# Patient Record
Sex: Female | Born: 1935 | Race: White | Hispanic: No | Smoking: Former smoker
Health system: Southern US, Community
[De-identification: ages and names within clinical notes are randomized; demographics above are authoritative.]

## PROBLEM LIST (undated history)

## (undated) DIAGNOSIS — I10 Essential (primary) hypertension: Secondary | ICD-10-CM

## (undated) DIAGNOSIS — E079 Disorder of thyroid, unspecified: Secondary | ICD-10-CM

## (undated) DIAGNOSIS — C50919 Malignant neoplasm of unspecified site of unspecified female breast: Secondary | ICD-10-CM

## (undated) DIAGNOSIS — Z923 Personal history of irradiation: Secondary | ICD-10-CM

## (undated) HISTORY — PX: CHOLECYSTECTOMY: SHX55

---

## 1999-01-07 HISTORY — PX: BREAST LUMPECTOMY: SHX2

## 1999-06-05 DIAGNOSIS — C50919 Malignant neoplasm of unspecified site of unspecified female breast: Secondary | ICD-10-CM

## 1999-06-05 HISTORY — DX: Malignant neoplasm of unspecified site of unspecified female breast: C50.919

## 1999-06-05 HISTORY — PX: BREAST BIOPSY: SHX20

## 2003-11-21 ENCOUNTER — Ambulatory Visit: Payer: Self-pay | Admitting: Oncology

## 2003-12-07 ENCOUNTER — Ambulatory Visit: Payer: Self-pay | Admitting: Oncology

## 2004-05-13 ENCOUNTER — Ambulatory Visit: Payer: Self-pay | Admitting: Oncology

## 2004-05-17 ENCOUNTER — Ambulatory Visit: Payer: Self-pay | Admitting: Oncology

## 2004-11-15 ENCOUNTER — Ambulatory Visit: Payer: Self-pay | Admitting: Oncology

## 2005-05-12 ENCOUNTER — Ambulatory Visit: Payer: Self-pay | Admitting: Oncology

## 2005-05-14 ENCOUNTER — Ambulatory Visit: Payer: Self-pay | Admitting: Oncology

## 2005-06-06 ENCOUNTER — Ambulatory Visit: Payer: Self-pay | Admitting: Oncology

## 2005-11-12 ENCOUNTER — Ambulatory Visit: Payer: Self-pay | Admitting: Oncology

## 2005-12-06 ENCOUNTER — Ambulatory Visit: Payer: Self-pay | Admitting: Oncology

## 2006-05-07 ENCOUNTER — Ambulatory Visit: Payer: Self-pay | Admitting: Oncology

## 2006-05-11 ENCOUNTER — Ambulatory Visit: Payer: Self-pay | Admitting: Oncology

## 2006-05-18 ENCOUNTER — Ambulatory Visit: Payer: Self-pay | Admitting: Oncology

## 2006-06-07 ENCOUNTER — Ambulatory Visit: Payer: Self-pay | Admitting: Oncology

## 2006-12-07 ENCOUNTER — Ambulatory Visit: Payer: Self-pay | Admitting: Oncology

## 2006-12-09 ENCOUNTER — Ambulatory Visit: Payer: Self-pay | Admitting: Oncology

## 2007-01-07 ENCOUNTER — Ambulatory Visit: Payer: Self-pay | Admitting: Oncology

## 2007-05-19 ENCOUNTER — Ambulatory Visit: Payer: Self-pay | Admitting: Oncology

## 2007-06-07 ENCOUNTER — Ambulatory Visit: Payer: Self-pay | Admitting: Oncology

## 2007-06-09 ENCOUNTER — Ambulatory Visit: Payer: Self-pay | Admitting: Oncology

## 2007-07-02 ENCOUNTER — Ambulatory Visit: Payer: Self-pay | Admitting: Family Medicine

## 2007-07-07 ENCOUNTER — Ambulatory Visit: Payer: Self-pay | Admitting: Oncology

## 2007-12-07 ENCOUNTER — Ambulatory Visit: Payer: Self-pay | Admitting: Oncology

## 2007-12-16 ENCOUNTER — Ambulatory Visit: Payer: Self-pay | Admitting: Oncology

## 2008-01-07 ENCOUNTER — Ambulatory Visit: Payer: Self-pay | Admitting: Oncology

## 2008-05-19 ENCOUNTER — Ambulatory Visit: Payer: Self-pay | Admitting: Oncology

## 2008-06-06 ENCOUNTER — Ambulatory Visit: Payer: Self-pay | Admitting: Oncology

## 2008-06-13 ENCOUNTER — Ambulatory Visit: Payer: Self-pay | Admitting: Oncology

## 2008-07-06 ENCOUNTER — Ambulatory Visit: Payer: Self-pay | Admitting: Oncology

## 2008-11-06 ENCOUNTER — Ambulatory Visit: Payer: Self-pay | Admitting: Oncology

## 2008-11-28 ENCOUNTER — Ambulatory Visit: Payer: Self-pay | Admitting: Oncology

## 2008-12-06 ENCOUNTER — Ambulatory Visit: Payer: Self-pay | Admitting: Oncology

## 2009-05-22 ENCOUNTER — Ambulatory Visit: Payer: Self-pay | Admitting: Oncology

## 2009-05-29 ENCOUNTER — Ambulatory Visit: Payer: Self-pay | Admitting: Oncology

## 2009-06-06 ENCOUNTER — Ambulatory Visit: Payer: Self-pay | Admitting: Oncology

## 2010-05-28 ENCOUNTER — Ambulatory Visit: Payer: Self-pay | Admitting: Oncology

## 2010-06-05 ENCOUNTER — Ambulatory Visit: Payer: Self-pay | Admitting: Oncology

## 2010-06-07 ENCOUNTER — Ambulatory Visit: Payer: Self-pay | Admitting: Oncology

## 2011-05-30 ENCOUNTER — Ambulatory Visit: Payer: Self-pay | Admitting: Oncology

## 2011-06-06 ENCOUNTER — Ambulatory Visit: Payer: Self-pay | Admitting: Oncology

## 2011-06-06 LAB — COMPREHENSIVE METABOLIC PANEL
Albumin: 3.6 g/dL (ref 3.4–5.0)
Alkaline Phosphatase: 82 U/L (ref 50–136)
Anion Gap: 8 (ref 7–16)
Bilirubin,Total: 0.4 mg/dL (ref 0.2–1.0)
Calcium, Total: 9.3 mg/dL (ref 8.5–10.1)
Chloride: 103 mmol/L (ref 98–107)
Co2: 27 mmol/L (ref 21–32)
Glucose: 106 mg/dL — ABNORMAL HIGH (ref 65–99)
Potassium: 4 mmol/L (ref 3.5–5.1)
SGOT(AST): 27 U/L (ref 15–37)
SGPT (ALT): 29 U/L
Sodium: 138 mmol/L (ref 136–145)
Total Protein: 7.5 g/dL (ref 6.4–8.2)

## 2011-06-06 LAB — CBC CANCER CENTER
Basophil #: 0.1 x10 3/mm (ref 0.0–0.1)
Basophil %: 0.7 %
Eosinophil #: 0.5 x10 3/mm (ref 0.0–0.7)
Eosinophil %: 6.2 %
HCT: 41.7 % (ref 35.0–47.0)
HGB: 13.9 g/dL (ref 12.0–16.0)
Lymphocyte #: 2 x10 3/mm (ref 1.0–3.6)
Lymphocyte %: 26.5 %
MCH: 31.5 pg (ref 26.0–34.0)
MCHC: 33.3 g/dL (ref 32.0–36.0)
MCV: 95 fL (ref 80–100)
Neutrophil #: 4.4 x10 3/mm (ref 1.4–6.5)
Platelet: 262 x10 3/mm (ref 150–440)
RBC: 4.41 10*6/uL (ref 3.80–5.20)
RDW: 14.5 % (ref 11.5–14.5)
WBC: 7.7 x10 3/mm (ref 3.6–11.0)

## 2011-06-07 ENCOUNTER — Ambulatory Visit: Payer: Self-pay | Admitting: Oncology

## 2011-06-07 LAB — CANCER ANTIGEN 27.29: CA 27.29: 19.3 U/mL (ref 0.0–38.6)

## 2012-05-15 ENCOUNTER — Ambulatory Visit: Payer: Self-pay | Admitting: Emergency Medicine

## 2012-05-15 LAB — URINALYSIS, COMPLETE
Glucose,UR: NEGATIVE mg/dL (ref 0–75)
Ketone: NEGATIVE
Nitrite: NEGATIVE

## 2012-06-02 ENCOUNTER — Ambulatory Visit: Payer: Self-pay | Admitting: Family Medicine

## 2013-03-01 ENCOUNTER — Ambulatory Visit: Payer: Self-pay | Admitting: Emergency Medicine

## 2013-06-07 ENCOUNTER — Ambulatory Visit: Payer: Self-pay | Admitting: Family Medicine

## 2013-06-30 DIAGNOSIS — E039 Hypothyroidism, unspecified: Secondary | ICD-10-CM | POA: Diagnosis present

## 2013-06-30 DIAGNOSIS — I10 Essential (primary) hypertension: Secondary | ICD-10-CM | POA: Insufficient documentation

## 2014-04-26 ENCOUNTER — Ambulatory Visit
Admit: 2014-04-26 | Disposition: A | Discharge status: Home / Self Care | Payer: Self-pay | Attending: Ophthalmology | Admitting: Ophthalmology

## 2014-05-11 ENCOUNTER — Other Ambulatory Visit: Payer: Self-pay

## 2014-05-11 DIAGNOSIS — Z1231 Encounter for screening mammogram for malignant neoplasm of breast: Secondary | ICD-10-CM

## 2014-06-13 ENCOUNTER — Ambulatory Visit
Admission: RE | Admit: 2014-06-13 | Discharge: 2014-06-13 | Disposition: A | Discharge status: Home / Self Care | Payer: Medicare Other | Source: Ambulatory Visit | Attending: Diagnostic Radiology | Admitting: Diagnostic Radiology

## 2014-06-13 DIAGNOSIS — Z1231 Encounter for screening mammogram for malignant neoplasm of breast: Secondary | ICD-10-CM | POA: Diagnosis not present

## 2014-06-13 HISTORY — DX: Malignant neoplasm of unspecified site of unspecified female breast: C50.919

## 2015-07-04 ENCOUNTER — Other Ambulatory Visit: Payer: Self-pay | Admitting: Family Medicine

## 2015-07-04 DIAGNOSIS — Z1231 Encounter for screening mammogram for malignant neoplasm of breast: Secondary | ICD-10-CM

## 2015-07-19 ENCOUNTER — Ambulatory Visit
Admission: RE | Admit: 2015-07-19 | Discharge: 2015-07-19 | Disposition: A | Discharge status: Home / Self Care | Payer: Medicare Other | Source: Ambulatory Visit | Attending: Family Medicine | Admitting: Family Medicine

## 2015-07-19 DIAGNOSIS — Z1231 Encounter for screening mammogram for malignant neoplasm of breast: Secondary | ICD-10-CM | POA: Insufficient documentation

## 2016-08-19 ENCOUNTER — Other Ambulatory Visit: Payer: Self-pay | Admitting: Family Medicine

## 2016-08-19 DIAGNOSIS — Z1231 Encounter for screening mammogram for malignant neoplasm of breast: Secondary | ICD-10-CM

## 2016-08-27 ENCOUNTER — Ambulatory Visit
Admission: RE | Admit: 2016-08-27 | Discharge: 2016-08-27 | Disposition: A | Discharge status: Home / Self Care | Payer: Medicare Other | Source: Ambulatory Visit | Attending: Family Medicine | Admitting: Family Medicine

## 2016-08-27 DIAGNOSIS — Z1231 Encounter for screening mammogram for malignant neoplasm of breast: Secondary | ICD-10-CM | POA: Diagnosis present

## 2016-08-27 HISTORY — DX: Personal history of irradiation: Z92.3

## 2016-11-12 ENCOUNTER — Encounter: Payer: Self-pay | Admitting: *Deleted

## 2016-11-12 ENCOUNTER — Ambulatory Visit
Admission: EM | Admit: 2016-11-12 | Discharge: 2016-11-12 | Disposition: A | Discharge status: Home / Self Care | Payer: Medicare Other | Attending: Family Medicine | Admitting: Family Medicine

## 2016-11-12 DIAGNOSIS — M5432 Sciatica, left side: Secondary | ICD-10-CM

## 2016-11-12 HISTORY — DX: Disorder of thyroid, unspecified: E07.9

## 2016-11-12 HISTORY — DX: Essential (primary) hypertension: I10

## 2016-11-12 MED ORDER — GABAPENTIN 300 MG PO CAPS: 300.0000 mg | ORAL_CAPSULE | Freq: Three times a day (TID) | ORAL | 0 refills | Status: DC

## 2016-11-12 MED ORDER — PREDNISONE 20 MG PO TABS: 20.0000 mg | ORAL_TABLET | Freq: Every day | ORAL | 0 refills | Status: DC

## 2016-11-12 NOTE — ED Provider Notes (Signed)
[facility]    CSN: [id number] Arrival date & time: 11/12/16  1538     History   Chief Complaint Chief Complaint  Patient presents with  . Leg Pain    HPI Nichole Hess is a 83 y.o. female.   83 yo female with a c/o left leg pain for 4 days. States pain actually feels like it starts at her lower back area and then radiates down the back and side of her leg down to the top of the foot. Denies any numbness/tingling, saddle anesthesia, or bowel/bladder problems. States she took some gabapentin that she had left over for trigeminal neuralgia and the gabapentin helped.    The history is provided by the patient.  Leg Pain    Past Medical History:  Diagnosis Date  . Breast cancer ([place]) 06/05/99   lumpectomy-positive  . Hypertension   . Personal history of radiation therapy   . Thyroid disease     There are no active problems to display for this patient.   Past Surgical History:  Procedure Laterality Date  . BREAST BIOPSY Left 06/05/99   lumpectomy/rad  . BREAST LUMPECTOMY Left 2001    OB History    No data available       Home Medications    Prior to Admission medications   Medication Sig Start Date End Date Taking? Authorizing Provider  hydrochlorothiazide (MICROZIDE) 12.5 MG capsule Take 12.5 mg daily by mouth.   Yes [provider]  levothyroxine (SYNTHROID, LEVOTHROID) 25 MCG tablet Take 25 mcg daily before breakfast by mouth.   Yes [provider]  lisinopril (PRINIVIL,ZESTRIL) 10 MG tablet Take 10 mg daily by mouth.   Yes [provider]  gabapentin (NEURONTIN) 300 MG capsule Take 1 capsule (300 mg total) 3 (three) times daily by mouth. 11/12/16   Norval Gable, MD  predniSONE (DELTASONE) 20 MG tablet Take 1 tablet (20 mg total) daily by mouth. 11/12/16   Norval Gable, MD    Family History Family History  Problem Relation Age of Onset  . Breast cancer Neg Hx     Social History Social History   Tobacco Use  .  Smoking status: Never Smoker  . Smokeless tobacco: Never Used  Substance Use Topics  . Alcohol use: No    Frequency: Never  . Drug use: No     Allergies   Patient has no known allergies.   Review of Systems Review of Systems   Physical Exam Triage Vital Signs ED Triage Vitals  Enc Vitals Group     BP 11/12/16 1548 (!) 162/61     Pulse Rate 11/12/16 1548 62     Resp 11/12/16 1548 16     Temp 11/12/16 1548 97.9 F (36.6 C)     Temp Source 11/12/16 1548 Oral     SpO2 11/12/16 1548 98 %     Weight --      Height --      Head Circumference --      Peak Flow --      Pain Score 11/12/16 1550 9     Pain Loc --      Pain Edu? --      Excl. in [place]? --    No data found.  Updated Vital Signs BP (!) 158/73 (BP Location: Left Arm)   Pulse 62   Temp 97.9 F (36.6 C) (Oral)   Resp 16   SpO2 98%   Visual Acuity Right Eye Distance:  Left Eye Distance:   Bilateral Distance:    Right Eye Near:   Left Eye Near:    Bilateral Near:     Physical Exam  Constitutional: She appears well-developed and well-nourished. No distress.  Musculoskeletal:       Lumbar back: She exhibits tenderness (over the left buttock over the the sciatic). She exhibits normal range of motion, no bony tenderness, no swelling, no edema, no deformity, no laceration, no pain, no spasm and normal pulse.  Skin: She is not diaphoretic.  Nursing note and vitals reviewed.    UC Treatments / Results  Labs (all labs ordered are listed, but only abnormal results are displayed) Labs Reviewed - No data to display  EKG  EKG Interpretation None       Radiology No results found.  Procedures Procedures (including critical care time)  Medications Ordered in UC Medications - No data to display   Initial Impression / Assessment and Plan / UC Course  I have reviewed the triage vital signs and the nursing notes.  Pertinent labs & imaging results that were available during my care of the patient were  reviewed by me and considered in my medical decision making (see chart for details).       Final Clinical Impressions(s) / UC Diagnoses   Final diagnoses:  Sciatica, left side    ED Discharge Orders        Ordered    predniSONE (DELTASONE) 20 MG tablet  Daily     11/12/16 1636    gabapentin (NEURONTIN) 300 MG capsule  3 times daily     11/12/16 1636     1.diagnosis reviewed with patient 2. rx as per orders above; reviewed possible side effects, interactions, risks and benefits  3. Recommend supportive treatment with otc analgesics prn 4. Follow-up prn if symptoms worsen or don't improve Controlled Substance Prescriptions [place] Controlled Substance Registry consulted? Not Applicable   Norval Gable, MD 11/12/16 2101

## 2016-11-12 NOTE — ED Triage Notes (Signed)
Left leg pain x4 days. Denies injury.

## 2016-12-02 ENCOUNTER — Other Ambulatory Visit: Payer: Self-pay | Admitting: Family Medicine

## 2016-12-02 DIAGNOSIS — I700 Atherosclerosis of aorta: Secondary | ICD-10-CM

## 2016-12-03 ENCOUNTER — Other Ambulatory Visit: Payer: Self-pay | Admitting: Family Medicine

## 2016-12-03 DIAGNOSIS — I700 Atherosclerosis of aorta: Secondary | ICD-10-CM

## 2017-04-20 ENCOUNTER — Other Ambulatory Visit: Payer: Self-pay

## 2017-04-20 ENCOUNTER — Ambulatory Visit
Admission: EM | Admit: 2017-04-20 | Discharge: 2017-04-20 | Disposition: A | Discharge status: Home / Self Care | Payer: Medicare Other | Attending: Family Medicine | Admitting: Family Medicine

## 2017-04-20 ENCOUNTER — Encounter: Payer: Self-pay | Admitting: Emergency Medicine

## 2017-04-20 DIAGNOSIS — H8110 Benign paroxysmal vertigo, unspecified ear: Secondary | ICD-10-CM

## 2017-04-20 MED ORDER — MECLIZINE HCL 25 MG PO TABS: 25.0000 mg | ORAL_TABLET | Freq: Three times a day (TID) | ORAL | 0 refills | Status: DC | PRN

## 2017-04-20 NOTE — Discharge Instructions (Signed)
If persists, call your primary for vestibular rehab.  Take care  Dr. Lacinda Axon

## 2017-04-20 NOTE — ED Provider Notes (Signed)
[facility]  CSN: [id number] Arrival date & time: 04/20/17  1510  History   Chief Complaint Chief Complaint  Patient presents with  . Dizziness   HPI  84 year old female presents with dizziness.  Started last night.  Started abruptly.  She reports that she has had severe dizziness.  Described as "the room spinning".  Occurs predominantly with lying down and with certain head movements.  No vision changes.  No reports of weakness or neurological deficits.  No known inciting factor.  Improves with sitting up and rest.  Exacerbated by lying down and moving her head abruptly.  Dizziness is severe.  She reports accompanying nausea.  No other associated symptoms.  No other complaints or concerns at this time.  Past Medical History:  Diagnosis Date  . Breast cancer ([place]) 06/05/99   lumpectomy-positive  . Hypertension   . Personal history of radiation therapy   . Thyroid disease    Past Surgical History:  Procedure Laterality Date  . BREAST BIOPSY Left 06/05/99   lumpectomy/rad  . BREAST LUMPECTOMY Left 2001   OB History   None    Home Medications    Prior to Admission medications   Medication Sig Start Date End Date Taking? Authorizing Provider  amLODipine (NORVASC) 2.5 MG tablet Take by mouth. 02/11/17  Yes [provider]  carvedilol (COREG) 12.5 MG tablet Take by mouth. 02/11/17  Yes [provider]  gabapentin (NEURONTIN) 300 MG capsule Take 1 capsule (300 mg total) 3 (three) times daily by mouth. 11/12/16  Yes Conty, Linward Foster, MD  levothyroxine (SYNTHROID, LEVOTHROID) 25 MCG tablet Take 75 mcg by mouth daily before breakfast.    Yes [provider]  lisinopril (PRINIVIL,ZESTRIL) 10 MG tablet Take 10 mg daily by mouth.   Yes [provider]  pravastatin (PRAVACHOL) 40 MG tablet Take by mouth. 02/11/17  Yes [provider]  meclizine (ANTIVERT) 25 MG tablet Take 1 tablet (25 mg total) by mouth 3 (three) times daily as needed for  dizziness. 04/20/17   Coral Spikes, DO  Multiple Vitamin (MULTI-VITAMINS) TABS Take by mouth.    [provider]   Family History Family History  Problem Relation Age of Onset  . Breast cancer Neg Hx    Social History Social History   Tobacco Use  . Smoking status: Never Smoker  . Smokeless tobacco: Never Used  Substance Use Topics  . Alcohol use: No    Frequency: Never  . Drug use: No   Allergies   Patient has no known allergies.  Review of Systems Review of Systems  Gastrointestinal: Positive for nausea.  Neurological: Positive for dizziness.   Physical Exam Triage Vital Signs ED Triage Vitals  Enc Vitals Group     BP 04/20/17 1524 (!) 161/77     Pulse Rate 04/20/17 1524 60     Resp 04/20/17 1524 16     Temp 04/20/17 1524 97.6 F (36.4 C)     Temp Source 04/20/17 1524 Oral     SpO2 04/20/17 1524 97 %     Weight 04/20/17 1520 190 lb (86.2 kg)     Height 04/20/17 1520 5\' 6"  (1.676 m)     Head Circumference --      Peak Flow --      Pain Score 04/20/17 1520 0     Pain Loc --      Pain Edu? --      Excl. in [place]? --    Updated Vital Signs  BP (!) 161/77 (BP Location: Right Arm)   Pulse 60   Temp 97.6 F (36.4 C) (Oral)   Resp 16   Ht 5\' 6"  (1.676 m)   Wt 190 lb (86.2 kg)   SpO2 97%   BMI 30.67 kg/m   Physical Exam  Constitutional: She is oriented to person, place, and time. She appears well-developed. No distress.  HENT:  Head: Normocephalic and atraumatic.  Eyes: Pupils are equal, round, and reactive to light. Conjunctivae are normal.  Cardiovascular: Normal rate and regular rhythm.  Pulmonary/Chest: Effort normal and breath sounds normal.  Neurological: She is alert and oriented to person, place, and time.  Nystagmus noted with Dix-Hallpike testing.  Reproduction of symptoms occurred.  Psychiatric: She has a normal mood and affect. Her behavior is normal.  Nursing note and vitals reviewed.  UC Treatments / Results  Labs (all labs ordered  are listed, but only abnormal results are displayed) Labs Reviewed - No data to display  EKG None Radiology No results found.  Procedures Procedures (including critical care time)  Medications Ordered in UC Medications - No data to display   Initial Impression / Assessment and Plan / UC Course  I have reviewed the triage vital signs and the nursing notes.  Pertinent labs & imaging results that were available during my care of the patient were reviewed by me and considered in my medical decision making (see chart for details).     84 year old female presents with BPPV. Treating with Meclizine. Advised to try Epley maneuver. If persists, see vestibular rehab.  Final Clinical Impressions(s) / UC Diagnoses   Final diagnoses:  Benign paroxysmal positional vertigo, unspecified laterality    ED Discharge Orders        Ordered    meclizine (ANTIVERT) 25 MG tablet  3 times daily PRN     04/20/17 1550     Controlled Substance Prescriptions [place] Controlled Substance Registry consulted? Not Applicable   Coral Spikes, DO 04/20/17 1603

## 2017-04-20 NOTE — ED Triage Notes (Signed)
Patient states that she feels like she is drunk.  Patient reports dizziness that started last night.

## 2017-06-02 ENCOUNTER — Ambulatory Visit
Admission: EM | Admit: 2017-06-02 | Discharge: 2017-06-02 | Disposition: A | Discharge status: Home / Self Care | Payer: Medicare Other

## 2017-06-02 DIAGNOSIS — G500 Trigeminal neuralgia: Secondary | ICD-10-CM

## 2017-06-02 MED ORDER — KETOROLAC TROMETHAMINE 60 MG/2ML IM SOLN
60.0000 mg | Freq: Once | INTRAMUSCULAR | Status: AC
  Administered 2017-06-02: 60 mg via INTRAMUSCULAR

## 2017-06-02 MED ORDER — GABAPENTIN 300 MG PO CAPS: 300.0000 mg | ORAL_CAPSULE | Freq: Three times a day (TID) | ORAL | 0 refills | Status: DC

## 2017-06-02 MED ORDER — TRAMADOL HCL 50 MG PO TABS: 50.0000 mg | ORAL_TABLET | Freq: Four times a day (QID) | ORAL | 0 refills | Status: AC | PRN

## 2017-06-02 MED ORDER — DEXAMETHASONE SODIUM PHOSPHATE 10 MG/ML IJ SOLN
10.0000 mg | Freq: Once | INTRAMUSCULAR | Status: AC
  Administered 2017-06-02: 10 mg via INTRAMUSCULAR

## 2017-06-02 MED ORDER — METHYLPREDNISOLONE SODIUM SUCC 40 MG IJ SOLR: 80.0000 mg | Freq: Once | INTRAMUSCULAR | Status: DC

## 2017-06-02 NOTE — ED Triage Notes (Signed)
Pt here for left ear pain that started last night and states it just her left ear. Did take ibuprofen without relief. No other symptoms reported.

## 2017-06-02 NOTE — ED Provider Notes (Signed)
[facility]    CSN: [id number] Arrival date & time: 06/02/17  1525     History   Chief Complaint Chief Complaint  Patient presents with  . Otalgia    HPI Nichole Hess is a 84 y.o. female.   Subjective:  Nichole Hess is a 84 y.o. female who presents for evaluation of left facial pain.  Acute in onset 1 day ago.  Pain is described as a sharp, intense stabbing pain that radiates into the ear.  Pain last just a few seconds but occurs very frequently.  Nothing seems to make the pain worse or better.  She rates the pain 10 out of 10 severity.  She denies any facial numbness, slurred speech, facial asymmetry, neck pain/stiffness or headache.  She has tried ibuprofen without any relief.  Notably, the patient has a history of trigeminal neuralgia several years ago and reports that this pain is similar to that.  The following portions of the patient's history were reviewed and updated as appropriate: allergies, current medications, past family history, past medical history, past social history, past surgical history and problem list.        Past Medical History:  Diagnosis Date  . Breast cancer ([place]) 06/05/99   lumpectomy-positive  . Hypertension   . Personal history of radiation therapy   . Thyroid disease     There are no active problems to display for this patient.   Past Surgical History:  Procedure Laterality Date  . BREAST BIOPSY Left 06/05/99   lumpectomy/rad  . BREAST LUMPECTOMY Left 2001    OB History   None      Home Medications    Prior to Admission medications   Medication Sig Start Date End Date Taking? Authorizing Provider  amLODipine (NORVASC) 2.5 MG tablet Take by mouth. 02/11/17  Yes [provider]  aspirin 81 MG chewable tablet Chew by mouth daily.   Yes [provider]  carvedilol (COREG) 12.5 MG tablet Take by mouth. 02/11/17  Yes [provider]  levothyroxine (SYNTHROID, LEVOTHROID) 25 MCG tablet Take 75  mcg by mouth daily before breakfast.    Yes [provider]  lisinopril (PRINIVIL,ZESTRIL) 10 MG tablet Take 10 mg daily by mouth.   Yes [provider]  Multiple Vitamin (MULTI-VITAMINS) TABS Take by mouth.   Yes [provider]  pravastatin (PRAVACHOL) 40 MG tablet Take by mouth. 02/11/17  Yes [provider]  gabapentin (NEURONTIN) 300 MG capsule Take 1 capsule (300 mg total) by mouth 3 (three) times daily for 7 days. 06/02/17 06/09/17  Enrique Sack, FNP  meclizine (ANTIVERT) 25 MG tablet Take 1 tablet (25 mg total) by mouth 3 (three) times daily as needed for dizziness. 04/20/17   Coral Spikes, DO  traMADol (ULTRAM) 50 MG tablet Take 1 tablet (50 mg total) by mouth every 6 (six) hours as needed for up to 5 days. 06/02/17 06/07/17  Enrique Sack, FNP    Family History Family History  Problem Relation Age of Onset  . Breast cancer Neg Hx     Social History Social History   Tobacco Use  . Smoking status: Never Smoker  . Smokeless tobacco: Never Used  Substance Use Topics  . Alcohol use: No    Frequency: Never  . Drug use: No     Allergies   Patient has no known allergies.   Review of Systems Review of Systems  Constitutional: Negative for fever.  HENT: Positive for ear pain.  Left sided facial pain   Musculoskeletal: Negative for neck pain and neck stiffness.  Neurological: Negative for headaches.  All other systems reviewed and are negative.    Physical Exam Triage Vital Signs ED Triage Vitals  Enc Vitals Group     BP 06/02/17 1551 132/72     Pulse Rate 06/02/17 1551 (!) 57     Resp 06/02/17 1551 18     Temp 06/02/17 1551 97.7 F (36.5 C)     Temp Source 06/02/17 1551 Oral     SpO2 06/02/17 1551 96 %     Weight --      Height --      Head Circumference --      Peak Flow --      Pain Score 06/02/17 1553 9     Pain Loc --      Pain Edu? --      Excl. in [place]? --    No data found.  Updated Vital Signs BP 132/72  (BP Location: Left Arm)   Pulse (!) 57   Temp 97.7 F (36.5 C) (Oral)   Resp 18   SpO2 96%   Visual Acuity Right Eye Distance:   Left Eye Distance:   Bilateral Distance:    Right Eye Near:   Left Eye Near:    Bilateral Near:     Physical Exam  Constitutional: She is oriented to person, place, and time. She appears well-developed and well-nourished.  Uncomfortable but nontoxic appearing   HENT:  Head: Normocephalic and atraumatic.  Right Ear: External ear normal.  Left Ear: External ear normal.  Nose: Nose normal.  Mouth/Throat: Oropharynx is clear and moist.  Eyes: Pupils are equal, round, and reactive to light. Conjunctivae and EOM are normal.  Neck: Normal range of motion. Neck supple.  Cardiovascular: Normal rate and regular rhythm.  Pulmonary/Chest: Effort normal and breath sounds normal.  Musculoskeletal: Normal range of motion.  Lymphadenopathy:    She has no cervical adenopathy.  Neurological: She is alert and oriented to person, place, and time. She has normal strength. No cranial nerve deficit or sensory deficit. GCS eye subscore is 4. GCS verbal subscore is 5. GCS motor subscore is 6.  Skin: Skin is warm and dry.  Psychiatric: She has a normal mood and affect.     UC Treatments / Results  Labs (all labs ordered are listed, but only abnormal results are displayed) Labs Reviewed - No data to display  EKG None  Radiology No results found.  Procedures Procedures (including critical care time)  Medications Ordered in UC Medications  ketorolac (TORADOL) injection 60 mg (60 mg Intramuscular Given 06/02/17 1720)  dexamethasone (DECADRON) injection 10 mg (10 mg Intramuscular Given 06/02/17 1722)    Initial Impression / Assessment and Plan / UC Course  I have reviewed the triage vital signs and the nursing notes.  Pertinent labs & imaging results that were available during my care of the patient were reviewed by me and considered in my medical decision  making (see chart for details).     84 year old female with a 1 day history of sharp, intense, electric-like facial pain.  Patient is very uncomfortable but nontoxic-appearing.  No focal neuro deficits noted on exam.  Vital signs stable.  Afebrile.  Symptoms likely due to trigeminal neuralgia.  Plan: 1.  Decadron 10 mg IM given in clinic 2.  Toradol 60 mg IM given in clinic 3.  Gabapentin 300 mg 3 times daily 4.  Ultram 50  mg every 6 hours as needed 5.  Follow-up with primary care provider in the next 48 hours for reevaluation and further management  Evaluation has revealed no signs of a dangerous process. Discussed diagnosis with patient. Patient aware of their illness, possible red flag symptoms to watch out for and need for close follow up. Patient understands verbal and written discharge instructions. Patient comfortable with plan and disposition.  Patient a clear mental status at this time, good insight into illness (after discussion and teaching) and has clear judgment to make decisions regarding their care.  Documentation was completed with the aid of voice recognition software. Transcription may contain typographical errors. Final Clinical Impressions(s) / UC Diagnoses   Final diagnoses:  Trigeminal neuralgia of left side of face     Discharge Instructions     Given an injection of pain medication and steroids today in the clinic.  Start taking gabapentin 300 mg 3 times a day.  You may also take Ultram every 6 hours as needed for severe pain.  Follow-up with your primary care doctor within the next 2 days.  Call in the morning to make an appointment.    ED Prescriptions    Medication Sig Dispense Auth. Provider   gabapentin (NEURONTIN) 300 MG capsule Take 1 capsule (300 mg total) by mouth 3 (three) times daily for 7 days. 21 capsule Enrique Sack, FNP   traMADol (ULTRAM) 50 MG tablet Take 1 tablet (50 mg total) by mouth every 6 (six) hours as needed for up to 5 days. 15  tablet Enrique Sack, FNP     Controlled Substance Prescriptions [place] Controlled Substance Registry consulted? Yes, I have consulted the [place] Controlled Substances Registry for this patient, and feel the risk/benefit ratio today is favorable for proceeding with this prescription for a controlled substance.   Enrique Sack, [place] 06/02/17 1735

## 2017-06-02 NOTE — Discharge Instructions (Addendum)
Given an injection of pain medication and steroids today in the clinic.  Start taking gabapentin 300 mg 3 times a day.  You may also take Ultram every 6 hours as needed for severe pain.  Follow-up with your primary care doctor within the next 2 days.  Call in the morning to make an appointment.

## 2017-08-11 ENCOUNTER — Other Ambulatory Visit: Payer: Self-pay | Admitting: Family Medicine

## 2017-08-11 DIAGNOSIS — Z1231 Encounter for screening mammogram for malignant neoplasm of breast: Secondary | ICD-10-CM

## 2017-08-31 ENCOUNTER — Ambulatory Visit: Payer: Medicare Other

## 2017-09-09 ENCOUNTER — Ambulatory Visit
Admission: RE | Admit: 2017-09-09 | Discharge: 2017-09-09 | Disposition: A | Discharge status: Home / Self Care | Payer: Medicare Other | Source: Ambulatory Visit | Attending: Family Medicine | Admitting: Family Medicine

## 2017-09-09 DIAGNOSIS — Z1231 Encounter for screening mammogram for malignant neoplasm of breast: Secondary | ICD-10-CM | POA: Insufficient documentation

## 2018-09-06 ENCOUNTER — Other Ambulatory Visit: Payer: Self-pay | Admitting: Nurse Practitioner

## 2018-09-06 DIAGNOSIS — Z1231 Encounter for screening mammogram for malignant neoplasm of breast: Secondary | ICD-10-CM

## 2018-09-22 ENCOUNTER — Ambulatory Visit
Admission: RE | Admit: 2018-09-22 | Discharge: 2018-09-22 | Disposition: A | Discharge status: Home / Self Care | Payer: Medicare Other | Source: Ambulatory Visit | Attending: Nurse Practitioner | Admitting: Nurse Practitioner

## 2018-09-22 ENCOUNTER — Other Ambulatory Visit: Payer: Self-pay

## 2018-09-22 DIAGNOSIS — Z1231 Encounter for screening mammogram for malignant neoplasm of breast: Secondary | ICD-10-CM

## 2018-10-14 ENCOUNTER — Encounter: Payer: Self-pay | Admitting: Emergency Medicine

## 2018-10-14 ENCOUNTER — Other Ambulatory Visit: Payer: Self-pay

## 2018-10-14 ENCOUNTER — Ambulatory Visit
Admission: EM | Admit: 2018-10-14 | Discharge: 2018-10-14 | Disposition: A | Discharge status: Home / Self Care | Payer: Medicare Other

## 2018-10-14 DIAGNOSIS — Y9384 Activity, sleeping: Secondary | ICD-10-CM

## 2018-10-14 DIAGNOSIS — S46819A Strain of other muscles, fascia and tendons at shoulder and upper arm level, unspecified arm, initial encounter: Secondary | ICD-10-CM | POA: Diagnosis not present

## 2018-10-14 MED ORDER — TRAMADOL HCL 50 MG PO TABS: 50.0000 mg | ORAL_TABLET | Freq: Two times a day (BID) | ORAL | 0 refills | Status: DC | PRN

## 2018-10-14 NOTE — ED Triage Notes (Signed)
Patient in today c/o stiffness in her neck and pain upon waking yesterday morning.

## 2018-10-14 NOTE — ED Provider Notes (Signed)
[facility] ____________________________________________  Time seen: Approximately 1:05 PM  I have reviewed the triage vital signs and the nursing notes.   HISTORY  Chief Complaint Neck Pain and neck stiffness   HPI Nichole Hess is a 85 y.o. female past medical history of hypertension, breast cancer presenting with daughter at bedside for evaluation of neck pain.  Patient reports she woke up with neck pain yesterday morning.  Reports that night before she fell asleep watching TV propped up on 2 pillows which is atypical for her.  States instead of waking up and taking out one pillow she continued to sleep on 2 pillows and ocular position.  States the next morning she woke up with tightness in her neck more on the left side than the right.  States this guards her movement.  States if she keeps her head completely still she does not have any pain.  States massaging her neck and applying heat has helped some.  Also did have a tizanidine muscle relaxer that she took yesterday with minimal improvement but no resolution, and states she is not able to sleep well last night due to the pain.  Denies any fall, injury.  Denies any headache, paresthesias, pain radiation, chest pain, shortness of breath, sore throat, difficulty swallowing or other accompanying complaints.  No rash.  Reports otherwise doing well.  Lives with daughter.  Gauger, Victoriano Lain, NP: PCP   Past Medical History:  Diagnosis Date  . Breast cancer ([place]) 06/05/99   lumpectomy-positive  . Hypertension   . Personal history of radiation therapy   . Thyroid disease     There are no active problems to display for this patient.   Past Surgical History:  Procedure Laterality Date  . BREAST BIOPSY Left 06/05/99   lumpectomy/rad  . BREAST LUMPECTOMY Left 2001  . CHOLECYSTECTOMY       No current facility-administered medications for this encounter.   Current Outpatient Medications:  .  amLODipine (NORVASC) 2.5  MG tablet, Take by mouth., Disp: , Rfl:  .  aspirin 81 MG chewable tablet, Chew by mouth daily., Disp: , Rfl:  .  carvedilol (COREG) 12.5 MG tablet, Take by mouth., Disp: , Rfl:  .  gabapentin (NEURONTIN) 300 MG capsule, Take 1 capsule (300 mg total) by mouth 3 (three) times daily for 7 days., Disp: 21 capsule, Rfl: 0 .  levothyroxine (SYNTHROID, LEVOTHROID) 25 MCG tablet, Take 75 mcg by mouth daily before breakfast. , Disp: , Rfl:  .  Multiple Vitamin (MULTI-VITAMINS) TABS, Take by mouth., Disp: , Rfl:  .  pravastatin (PRAVACHOL) 40 MG tablet, Take by mouth., Disp: , Rfl:  .  tiZANidine (ZANAFLEX) 2 MG tablet, Take 2 mg by mouth every 6 (six) hours as needed for muscle spasms., Disp: , Rfl:  .  losartan (COZAAR) 50 MG tablet, Take 1 tablet by mouth daily., Disp: , Rfl:  .  meclizine (ANTIVERT) 25 MG tablet, Take 1 tablet (25 mg total) by mouth 3 (three) times daily as needed for dizziness., Disp: 30 tablet, Rfl: 0 .  traMADol (ULTRAM) 50 MG tablet, Take 1 tablet (50 mg total) by mouth every 12 (twelve) hours as needed for moderate pain or severe pain. Do not take with muscle relaxant, Disp: 8 tablet, Rfl: 0  Allergies Patient has no known allergies.  Family History  Problem Relation Age of Onset  . Other Mother        sepsis  . Hypertension Mother   . Heart attack Father  51  . Breast cancer Neg Hx     Social History Social History   Tobacco Use  . Smoking status: Former Smoker    Packs/day: 1.00    Years: 10.00    Pack years: 10.00    Types: Cigarettes    Quit date: 10/13/1968    Years since quitting: 50.0  . Smokeless tobacco: Never Used  Substance Use Topics  . Alcohol use: No    Frequency: Never  . Drug use: No    Review of Systems Constitutional: No fever Eyes: No visual changes. ENT: No sore throat. Cardiovascular: Denies chest pain. Respiratory: Denies shortness of breath. Gastrointestinal: No abdominal pain.  No nausea, no vomiting.   Musculoskeletal: Positive  neck pain.  Negative back pain or other extremity pain. Skin: Negative for rash. Neurological: Negative for headaches, focal weakness or numbness.   ____________________________________________   PHYSICAL EXAM:  VITAL SIGNS: ED Triage Vitals  Enc Vitals Group     BP 10/14/18 1219 (!) 155/94     Pulse Rate 10/14/18 1219 (!) 57     Resp 10/14/18 1219 20     Temp 10/14/18 1219 98.2 F (36.8 C)     Temp Source 10/14/18 1219 Oral     SpO2 10/14/18 1219 97 %     Weight 10/14/18 1219 176 lb (79.8 kg)     Height 10/14/18 1219 5\' 5"  (1.651 m)     Head Circumference --      Peak Flow --      Pain Score 10/14/18 1218 10     Pain Loc --      Pain Edu? --      Excl. in [place]? --     Constitutional: Alert and oriented. Well appearing and in no acute distress. Eyes: Conjunctivae are normal.  ENT      Head: Normocephalic and atraumatic. Neck: No stridor. Supple without meningismus.  Hematological/Lymphatic/Immunilogical: No cervical lymphadenopathy. Cardiovascular: Normal rate, regular rhythm. Grossly normal heart sounds.  Good peripheral circulation. Respiratory: Normal respiratory effort without tachypnea nor retractions. Breath sounds are clear and equal bilaterally. No wheezes, rales, rhonchi. Musculoskeletal: Bilateral distal radial pulses equal and easily palpated.  Bilateral hand grip strong and equal.  No paresthesias.  No midline cervical, thoracic or lumbar tenderness to palpation.  Except: No midline cervical tenderness palpation.  Left trapezius tenderness palpation and minimal right trapezius tender to palpation, pain increases with cervical flexion and extension as well as right and left rotation, right and left rotation limited, overall good flexion and extension, no midline tenderness, no rash, no edema or ecchymosis. Neurologic:  Normal speech and language. No gross focal neurologic deficits are appreciated. Speech is normal. No gait instability.  Skin:  Skin is warm, dry and  intact. No rash noted. Psychiatric: Mood and affect are normal. Speech and behavior are normal. Patient exhibits appropriate insight and judgment   ___________________________________________   LABS (all labs ordered are listed, but only abnormal results are displayed)  Labs Reviewed - No data to display   PROCEDURES Procedures   INITIAL IMPRESSION / ASSESSMENT AND PLAN / ED COURSE  Pertinent labs & imaging results that were available during my care of the patient were reviewed by me and considered in my medical decision making (see chart for details).  Very well-appearing patient.  No acute distress.  Neck pain as above.  No other accompanying symptoms.  No pain with sitting completely still and not moving neck, pain fully reproducible by exam.  No trauma.  Will defer x-ray at this time, patient agrees.  Suspect muscular strain.  Patient did take 1 muscle relaxants that she has at home without relief.  Has tolerated tramadol well in the past, will Rx small quantity of tramadol for breakthrough pain.  Counseled massage, heat, supportive care.  Do not take muscle relaxant and tramadol together, patient and daughter understands.  Monitor and supportive care.Discussed indication, risks and benefits of medications with patient.  Discussed follow up with Primary care physician this week. Discussed follow up and return parameters including no resolution or any worsening concerns. Patient verbalized understanding and agreed to plan.   [place] controlled substance database reviewed, no recent controlled substances documented.  ____________________________________________   FINAL CLINICAL IMPRESSION(S) / ED DIAGNOSES  Final diagnoses:  Strain of trapezius muscle, unspecified laterality, initial encounter     ED Discharge Orders         Ordered    traMADol (ULTRAM) 50 MG tablet  Every 12 hours PRN     10/14/18 1303           Note: This dictation was prepared with Dragon  dictation along with smaller phrase technology. Any transcriptional errors that result from this process are unintentional.         Marylene Land, NP 10/14/18 1323

## 2018-10-14 NOTE — Discharge Instructions (Signed)
Take medication as prescribed. Rest. Drink plenty of fluids. Apply heat/massage.   Follow up with your primary care physician this week as needed. Return to Urgent care for new or worsening concerns.

## 2018-10-16 ENCOUNTER — Other Ambulatory Visit: Payer: Self-pay

## 2018-10-16 ENCOUNTER — Ambulatory Visit
Admission: EM | Admit: 2018-10-16 | Discharge: 2018-10-16 | Disposition: A | Discharge status: Home / Self Care | Payer: Medicare Other | Attending: Family Medicine | Admitting: Family Medicine

## 2018-10-16 DIAGNOSIS — M62838 Other muscle spasm: Secondary | ICD-10-CM | POA: Diagnosis not present

## 2018-10-16 DIAGNOSIS — M542 Cervicalgia: Secondary | ICD-10-CM | POA: Diagnosis not present

## 2018-10-16 MED ORDER — DIAZEPAM 5 MG PO TABS: 5.0000 mg | ORAL_TABLET | Freq: Two times a day (BID) | ORAL | 0 refills | Status: DC | PRN

## 2018-10-16 NOTE — Discharge Instructions (Signed)
Lots of heat.  Valium as directed.  Take care  Dr. Lacinda Axon

## 2018-10-16 NOTE — ED Provider Notes (Signed)
[facility]    CSN: [id number] Arrival date & time: 10/16/18  1131  History   Chief Complaint Chief Complaint  Patient presents with  . Neck Pain   HPI  85 year old female presents with neck pain.  Patient reports that she has had neck pain since Wednesday.  She was seen on 10/8.  Was prescribed tramadol for pain.  She has been using muscle relaxers at home without resolution.  Patient has also been taking gabapentin which she uses for trigeminal neuralgia without resolution.  She reports that her pain is located bilaterally.  She reports decreased range of motion in all planes due to pain.  She has tried heat without resolution.  Her pain is severe.  She rates her pain is 10/10 in severity.  She has difficulty sleeping.  Exacerbated by activity.  No relieving factors.  No radicular symptoms.  No other associated symptoms.  No other complaints.  PMH, Surgical Hx, Family Hx, Social History reviewed and updated as below.  Past Medical History:  Diagnosis Date  . Breast cancer ([place]) 06/05/99   lumpectomy-positive  . Hypertension   . Personal history of radiation therapy   . Thyroid disease    Past Surgical History:  Procedure Laterality Date  . BREAST BIOPSY Left 06/05/99   lumpectomy/rad  . BREAST LUMPECTOMY Left 2001  . CHOLECYSTECTOMY     OB History   No obstetric history on file.    Home Medications    Prior to Admission medications   Medication Sig Start Date End Date Taking? Authorizing Provider  amLODipine (NORVASC) 2.5 MG tablet Take by mouth. 02/11/17  Yes [provider]  aspirin 81 MG chewable tablet Chew by mouth daily.   Yes [provider]  carvedilol (COREG) 12.5 MG tablet Take by mouth. 02/11/17  Yes [provider]  gabapentin (NEURONTIN) 300 MG capsule Take 1 capsule (300 mg total) by mouth 3 (three) times daily for 7 days. 06/02/17 10/16/18 Yes Enrique Sack, FNP  levothyroxine (SYNTHROID, LEVOTHROID) 25 MCG tablet  Take 75 mcg by mouth daily before breakfast.    Yes [provider]  losartan (COZAAR) 50 MG tablet Take 1 tablet by mouth daily. 08/31/18  Yes [provider]  meclizine (ANTIVERT) 25 MG tablet Take 1 tablet (25 mg total) by mouth 3 (three) times daily as needed for dizziness. 04/20/17  Yes Shenae Bonanno, Barnie Del, DO  Multiple Vitamin (MULTI-VITAMINS) TABS Take by mouth.   Yes [provider]  pravastatin (PRAVACHOL) 40 MG tablet Take by mouth. 02/11/17  Yes [provider]  traMADol (ULTRAM) 50 MG tablet Take 1 tablet (50 mg total) by mouth every 12 (twelve) hours as needed for moderate pain or severe pain. Do not take with muscle relaxant 10/14/18  Yes Marylene Land, NP  diazepam (VALIUM) 5 MG tablet Take 1 tablet (5 mg total) by mouth every 12 (twelve) hours as needed for anxiety. 10/16/18   Coral Spikes, DO   Family History Family History  Problem Relation Age of Onset  . Other Mother        sepsis  . Hypertension Mother   . Heart attack Father 47  . Breast cancer Neg Hx     Social History Social History   Tobacco Use  . Smoking status: Former Smoker    Packs/day: 1.00    Years: 10.00    Pack years: 10.00    Types: Cigarettes    Quit date: 10/13/1968    Years since quitting: 50.0  .  Smokeless tobacco: Never Used  Substance Use Topics  . Alcohol use: No    Frequency: Never  . Drug use: No     Allergies   Patient has no known allergies.   Review of Systems Review of Systems  Constitutional: Negative.   Musculoskeletal: Positive for neck pain.   Physical Exam Triage Vital Signs ED Triage Vitals  Enc Vitals Group     BP 10/16/18 1148 (!) 155/73     Pulse Rate 10/16/18 1148 66     Resp 10/16/18 1148 16     Temp 10/16/18 1148 97.9 F (36.6 C)     Temp Source 10/16/18 1148 Oral     SpO2 10/16/18 1148 96 %     Weight 10/16/18 1143 174 lb 2.6 oz (79 kg)     Height 10/16/18 1143 5\' 5"  (1.651 m)     Head Circumference --      Peak Flow --       Pain Score 10/16/18 1143 10     Pain Loc --      Pain Edu? --      Excl. in [place]? --    Updated Vital Signs BP (!) 155/73 (BP Location: Left Arm)   Pulse 66   Temp 97.9 F (36.6 C) (Oral)   Resp 16   Ht 5\' 5"  (1.651 m)   Wt 79 kg   SpO2 96%   BMI 28.98 kg/m   Visual Acuity Right Eye Distance:   Left Eye Distance:   Bilateral Distance:    Right Eye Near:   Left Eye Near:    Bilateral Near:     Physical Exam Vitals signs and nursing note reviewed.  Constitutional:      General: She is not in acute distress.    Appearance: Normal appearance. She is not ill-appearing.  HENT:     Head: Normocephalic and atraumatic.  Eyes:     General:        Right eye: No discharge.        Left eye: No discharge.     Conjunctiva/sclera: Conjunctivae normal.  Neck:     Comments: Bilateral trapezius muscle spasm.  Decreased range of motion in all planes. Cardiovascular:     Rate and Rhythm: Normal rate and regular rhythm.  Pulmonary:     Effort: Pulmonary effort is normal. No respiratory distress.  Neurological:     Mental Status: She is alert.  Psychiatric:        Behavior: Behavior normal.    UC Treatments / Results  Labs (all labs ordered are listed, but only abnormal results are displayed) Labs Reviewed - No data to display  EKG   Radiology No results found.  Procedures Procedures (including critical care time)  Medications Ordered in UC Medications - No data to display  Initial Impression / Assessment and Plan / UC Course  I have reviewed the triage vital signs and the nursing notes.  Pertinent labs & imaging results that were available during my care of the patient were reviewed by me and considered in my medical decision making (see chart for details).    85 year old female presents with neck pain.  This is secondary to muscle spasm.  Patient has had no relief with pain medication and Zanaflex.  Having difficulty sleeping.  Prescribing a brief course of  Valium.  Advised family member to be cautious about sedation and increased risk of falls.  Patient and family number are in agreement.  Advised heat as well.  Supportive care.  Final Clinical Impressions(s) / UC Diagnoses   Final diagnoses:  Neck pain  Muscle spasms of neck     Discharge Instructions     Lots of heat.  Valium as directed.  Take care  Dr. Lacinda Axon    ED Prescriptions    Medication Sig Dispense Auth. Provider   diazepam (VALIUM) 5 MG tablet Take 1 tablet (5 mg total) by mouth every 12 (twelve) hours as needed for anxiety. 10 tablet Coral Spikes, DO     PDMP not reviewed this encounter.   Coral Spikes, DO 10/16/18 1256

## 2018-10-16 NOTE — ED Triage Notes (Signed)
Patient complains of neck pain and stiffness x 3 day. Patient states that she already has trigeminal neuralgia. Patient reports that she has worsening pain and cannot move her neck at all. States that she is unable to sleep due to pain.

## 2019-09-08 ENCOUNTER — Other Ambulatory Visit: Payer: Self-pay | Admitting: Gerontology

## 2019-09-08 ENCOUNTER — Other Ambulatory Visit: Payer: Self-pay | Admitting: Nurse Practitioner

## 2019-09-08 DIAGNOSIS — Z1231 Encounter for screening mammogram for malignant neoplasm of breast: Secondary | ICD-10-CM

## 2019-09-26 ENCOUNTER — Ambulatory Visit
Admission: RE | Admit: 2019-09-26 | Discharge: 2019-09-26 | Disposition: A | Discharge status: Home / Self Care | Payer: Medicare Other | Source: Ambulatory Visit | Attending: Gerontology | Admitting: Gerontology

## 2019-09-26 ENCOUNTER — Other Ambulatory Visit: Payer: Self-pay

## 2019-09-26 DIAGNOSIS — Z1231 Encounter for screening mammogram for malignant neoplasm of breast: Secondary | ICD-10-CM | POA: Insufficient documentation

## 2020-09-18 ENCOUNTER — Other Ambulatory Visit: Payer: Self-pay | Admitting: Gerontology

## 2020-09-18 DIAGNOSIS — Z1231 Encounter for screening mammogram for malignant neoplasm of breast: Secondary | ICD-10-CM

## 2020-10-09 ENCOUNTER — Other Ambulatory Visit: Payer: Self-pay

## 2020-10-09 ENCOUNTER — Ambulatory Visit
Admission: RE | Admit: 2020-10-09 | Discharge: 2020-10-09 | Disposition: A | Discharge status: Home / Self Care | Payer: Medicare HMO | Source: Ambulatory Visit | Attending: Gerontology | Admitting: Gerontology

## 2020-10-09 DIAGNOSIS — Z1231 Encounter for screening mammogram for malignant neoplasm of breast: Secondary | ICD-10-CM | POA: Insufficient documentation

## 2021-11-19 ENCOUNTER — Other Ambulatory Visit: Payer: Self-pay | Admitting: Gerontology

## 2021-11-19 DIAGNOSIS — Z1231 Encounter for screening mammogram for malignant neoplasm of breast: Secondary | ICD-10-CM

## 2021-11-25 ENCOUNTER — Ambulatory Visit
Admission: RE | Admit: 2021-11-25 | Discharge: 2021-11-25 | Disposition: A | Discharge status: Home / Self Care | Payer: Medicare HMO | Source: Ambulatory Visit | Attending: Gerontology | Admitting: Gerontology

## 2021-11-25 DIAGNOSIS — Z1231 Encounter for screening mammogram for malignant neoplasm of breast: Secondary | ICD-10-CM | POA: Insufficient documentation

## 2021-12-02 ENCOUNTER — Inpatient Hospital Stay: Admission: RE | Admit: 2021-12-02 | Payer: PRIVATE HEALTH INSURANCE | Source: Ambulatory Visit

## 2021-12-15 DIAGNOSIS — R7303 Prediabetes: Secondary | ICD-10-CM | POA: Diagnosis present

## 2022-02-24 IMAGING — MG MM DIGITAL SCREENING BILAT W/ TOMO AND CAD
8 series · 8 of 24 positions shown · non-contrast
Comparison: Previous exam(s).

CLINICAL DATA: Screening. History of LEFT breast cancer and
lumpectomy in 5995.

EXAM:
DIGITAL SCREENING BILATERAL MAMMOGRAM WITH TOMOSYNTHESIS AND CAD
TECHNIQUE: Bilateral screening digital craniocaudal and mediolateral oblique
mammograms were obtained. Bilateral screening digital breast
tomosynthesis was performed. The images were evaluated with
computer-aided detection.

[L CC synth-2D]
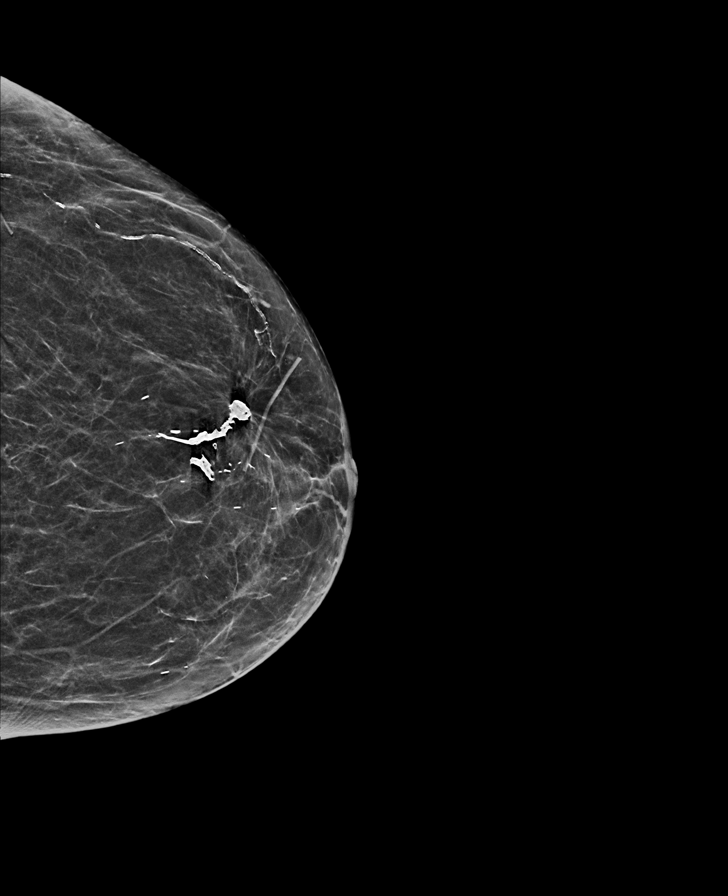

[R CC synth-2D]
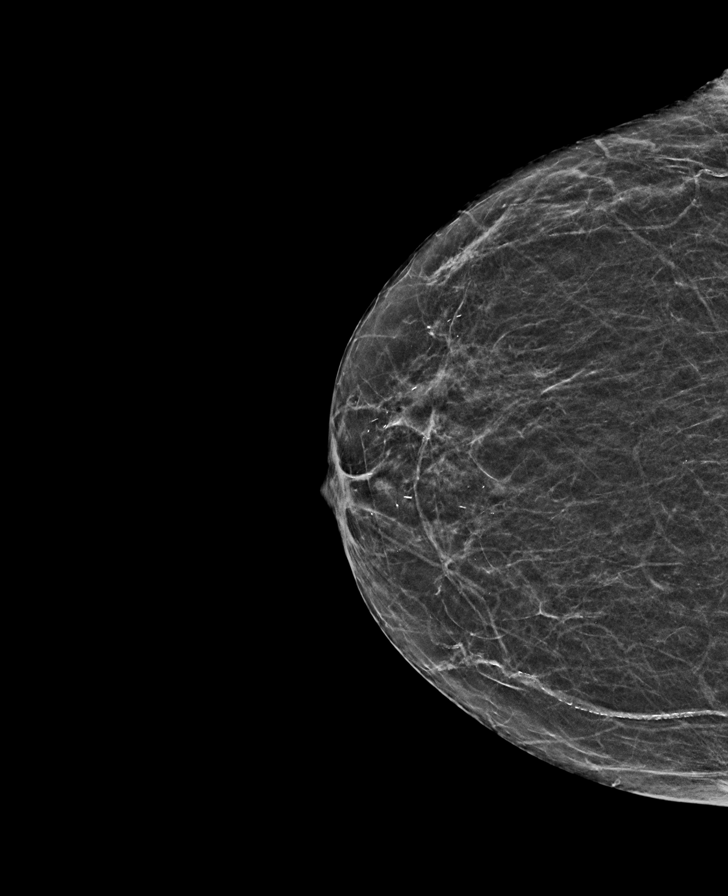

[R MLO synth-2D]
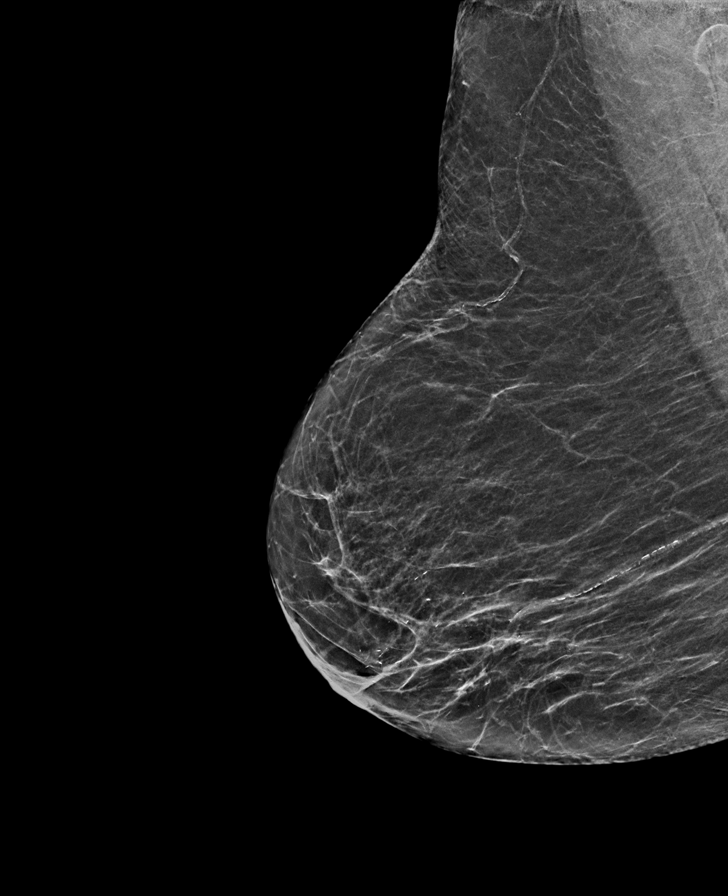

[L MLO synth-2D]
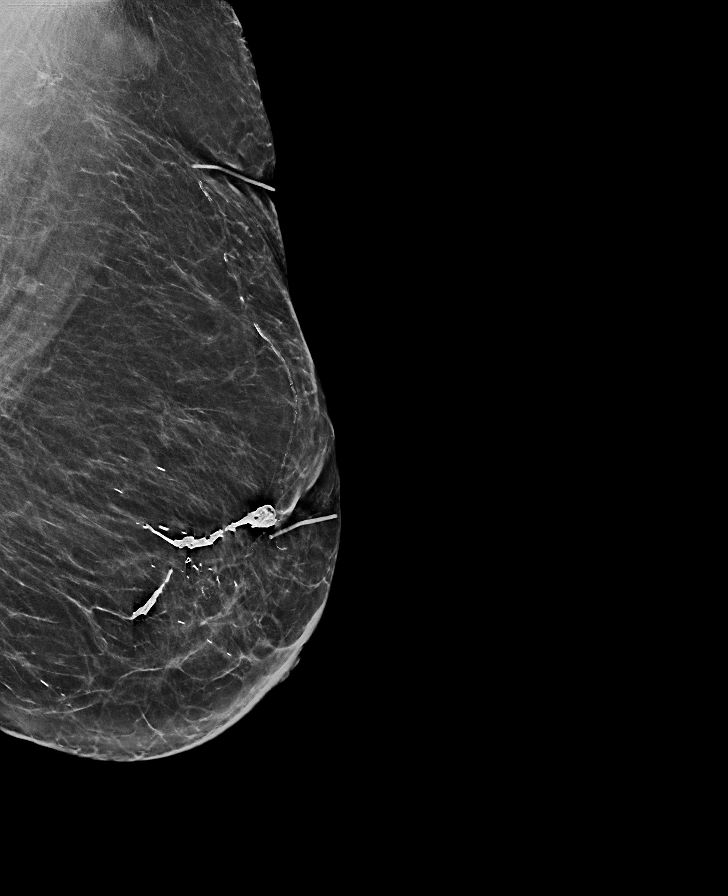

[R CC tomo · tomo slice 25/48.0]
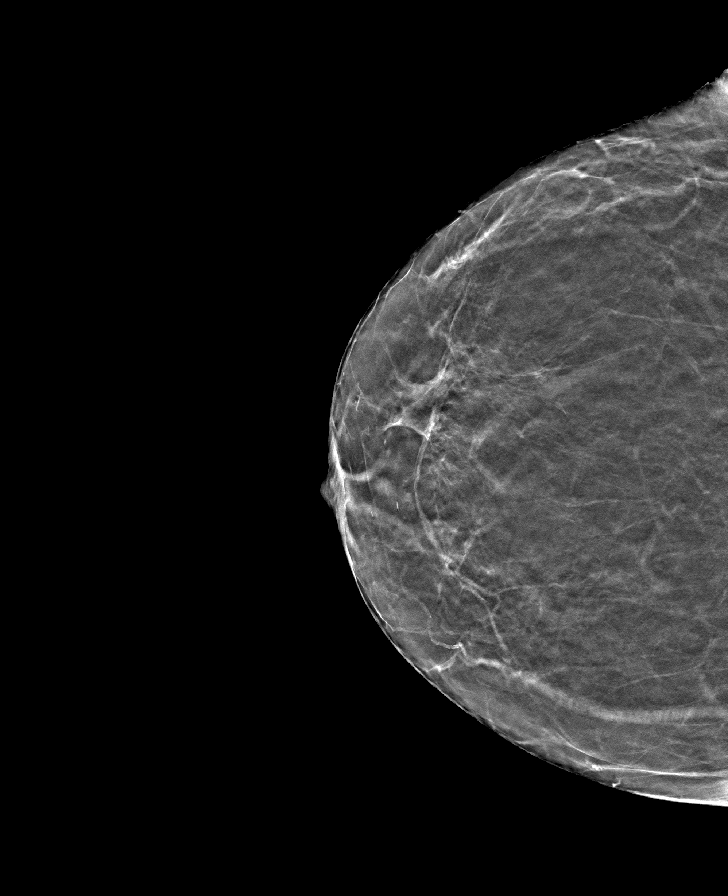

[L MLO tomo · tomo slice 31/62.0]
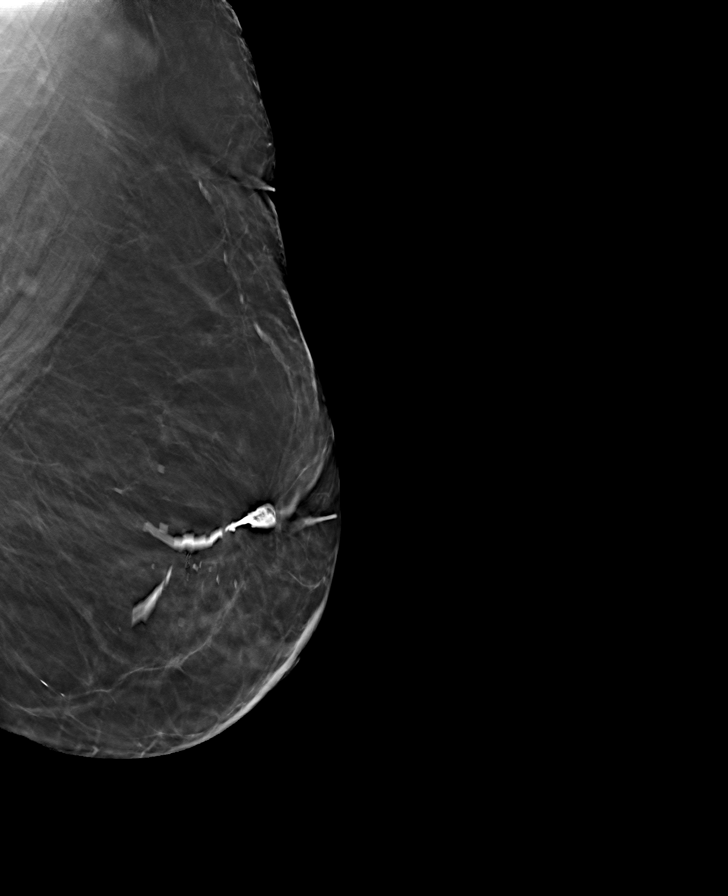

[R MLO tomo · tomo slice 29/57.0]
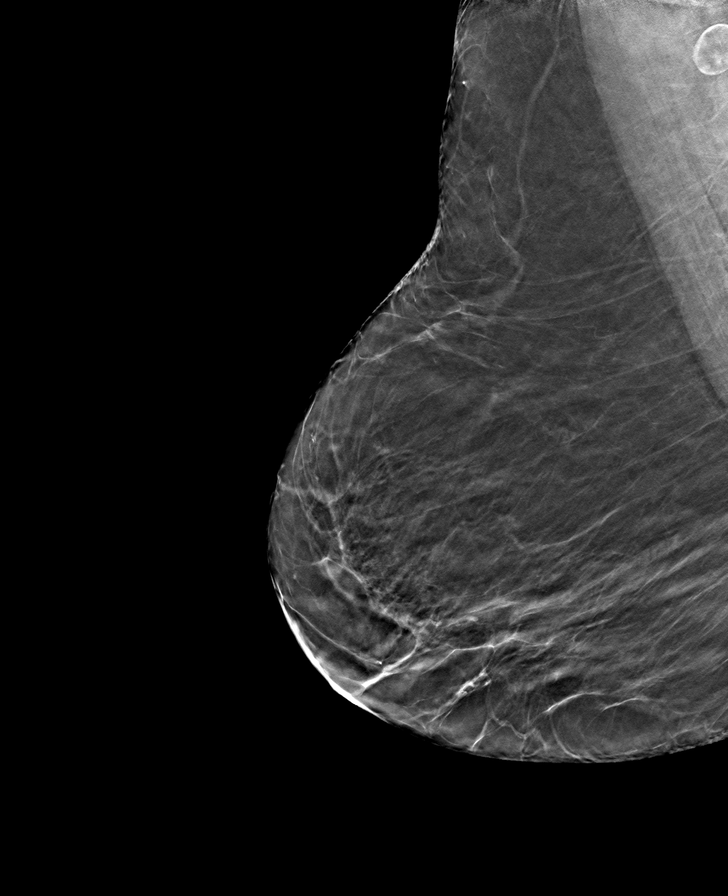

[L CC tomo · tomo slice 29/56.0]
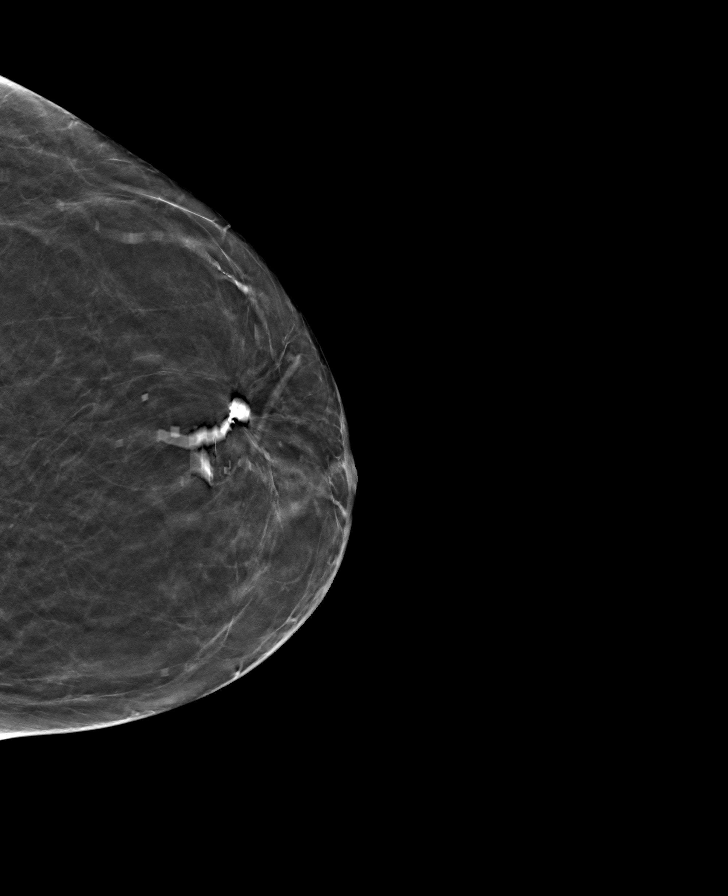

[8 of 24 positions shown; findings below may reference images not displayed]

ACR Breast Density Category b: There are scattered areas of
fibroglandular density.
FINDINGS: There are no findings suspicious for malignancy. LEFT lumpectomy
changes are again noted.
IMPRESSION: No mammographic evidence of malignancy. A result letter of this
screening mammogram will be mailed directly to the patient.

RECOMMENDATION:
Screening mammogram in one year. (Code:G6-E-SPR)

BI-RADS CATEGORY  2: Benign.

## 2022-08-26 ENCOUNTER — Inpatient Hospital Stay
Admission: EM | Admit: 2022-08-26 | Discharge: 2022-08-29 | DRG: 308 | Disposition: A | Discharge status: Home / Self Care | Payer: Medicare HMO | Attending: Internal Medicine | Admitting: Internal Medicine

## 2022-08-26 ENCOUNTER — Ambulatory Visit (INDEPENDENT_AMBULATORY_CARE_PROVIDER_SITE_OTHER): Payer: Medicare HMO

## 2022-08-26 ENCOUNTER — Ambulatory Visit
Admission: EM | Admit: 2022-08-26 | Discharge: 2022-08-26 | Disposition: A | Discharge status: Other Acute Inpatient Hospital | Payer: Medicare HMO | Source: Home / Self Care

## 2022-08-26 ENCOUNTER — Encounter: Payer: Self-pay | Admitting: Emergency Medicine

## 2022-08-26 ENCOUNTER — Other Ambulatory Visit: Payer: Self-pay

## 2022-08-26 DIAGNOSIS — Z1152 Encounter for screening for COVID-19: Secondary | ICD-10-CM

## 2022-08-26 DIAGNOSIS — R059 Cough, unspecified: Secondary | ICD-10-CM

## 2022-08-26 DIAGNOSIS — I447 Left bundle-branch block, unspecified: Secondary | ICD-10-CM | POA: Diagnosis present

## 2022-08-26 DIAGNOSIS — N1831 Chronic kidney disease, stage 3a: Secondary | ICD-10-CM | POA: Diagnosis present

## 2022-08-26 DIAGNOSIS — E7800 Pure hypercholesterolemia, unspecified: Secondary | ICD-10-CM | POA: Diagnosis present

## 2022-08-26 DIAGNOSIS — I361 Nonrheumatic tricuspid (valve) insufficiency: Secondary | ICD-10-CM

## 2022-08-26 DIAGNOSIS — J9601 Acute respiratory failure with hypoxia: Secondary | ICD-10-CM | POA: Diagnosis present

## 2022-08-26 DIAGNOSIS — I272 Pulmonary hypertension, unspecified: Secondary | ICD-10-CM | POA: Diagnosis present

## 2022-08-26 DIAGNOSIS — R0602 Shortness of breath: Secondary | ICD-10-CM | POA: Diagnosis present

## 2022-08-26 DIAGNOSIS — J90 Pleural effusion, not elsewhere classified: Secondary | ICD-10-CM | POA: Insufficient documentation

## 2022-08-26 DIAGNOSIS — I340 Nonrheumatic mitral (valve) insufficiency: Secondary | ICD-10-CM

## 2022-08-26 DIAGNOSIS — R0902 Hypoxemia: Secondary | ICD-10-CM | POA: Insufficient documentation

## 2022-08-26 DIAGNOSIS — I4891 Unspecified atrial fibrillation: Secondary | ICD-10-CM | POA: Insufficient documentation

## 2022-08-26 DIAGNOSIS — K5900 Constipation, unspecified: Secondary | ICD-10-CM | POA: Diagnosis not present

## 2022-08-26 DIAGNOSIS — R7303 Prediabetes: Secondary | ICD-10-CM | POA: Diagnosis present

## 2022-08-26 DIAGNOSIS — Z7901 Long term (current) use of anticoagulants: Secondary | ICD-10-CM

## 2022-08-26 DIAGNOSIS — J810 Acute pulmonary edema: Secondary | ICD-10-CM

## 2022-08-26 DIAGNOSIS — I081 Rheumatic disorders of both mitral and tricuspid valves: Secondary | ICD-10-CM | POA: Diagnosis present

## 2022-08-26 DIAGNOSIS — I700 Atherosclerosis of aorta: Secondary | ICD-10-CM | POA: Insufficient documentation

## 2022-08-26 DIAGNOSIS — Z923 Personal history of irradiation: Secondary | ICD-10-CM

## 2022-08-26 DIAGNOSIS — I5021 Acute systolic (congestive) heart failure: Secondary | ICD-10-CM | POA: Diagnosis present

## 2022-08-26 DIAGNOSIS — Z87891 Personal history of nicotine dependence: Secondary | ICD-10-CM

## 2022-08-26 DIAGNOSIS — I10 Essential (primary) hypertension: Secondary | ICD-10-CM | POA: Insufficient documentation

## 2022-08-26 DIAGNOSIS — I5041 Acute combined systolic (congestive) and diastolic (congestive) heart failure: Secondary | ICD-10-CM | POA: Diagnosis present

## 2022-08-26 DIAGNOSIS — J439 Emphysema, unspecified: Secondary | ICD-10-CM | POA: Insufficient documentation

## 2022-08-26 DIAGNOSIS — I129 Hypertensive chronic kidney disease with stage 1 through stage 4 chronic kidney disease, or unspecified chronic kidney disease: Secondary | ICD-10-CM | POA: Diagnosis present

## 2022-08-26 DIAGNOSIS — N183 Chronic kidney disease, stage 3 unspecified: Secondary | ICD-10-CM | POA: Diagnosis present

## 2022-08-26 DIAGNOSIS — Z7982 Long term (current) use of aspirin: Secondary | ICD-10-CM

## 2022-08-26 DIAGNOSIS — E079 Disorder of thyroid, unspecified: Secondary | ICD-10-CM | POA: Insufficient documentation

## 2022-08-26 DIAGNOSIS — Z79899 Other long term (current) drug therapy: Secondary | ICD-10-CM

## 2022-08-26 DIAGNOSIS — Z79890 Hormone replacement therapy (postmenopausal): Secondary | ICD-10-CM

## 2022-08-26 DIAGNOSIS — I420 Dilated cardiomyopathy: Secondary | ICD-10-CM | POA: Diagnosis present

## 2022-08-26 DIAGNOSIS — Z9049 Acquired absence of other specified parts of digestive tract: Secondary | ICD-10-CM

## 2022-08-26 DIAGNOSIS — I480 Paroxysmal atrial fibrillation: Secondary | ICD-10-CM | POA: Diagnosis present

## 2022-08-26 DIAGNOSIS — Z853 Personal history of malignant neoplasm of breast: Secondary | ICD-10-CM

## 2022-08-26 DIAGNOSIS — E039 Hypothyroidism, unspecified: Secondary | ICD-10-CM | POA: Diagnosis present

## 2022-08-26 DIAGNOSIS — Z8249 Family history of ischemic heart disease and other diseases of the circulatory system: Secondary | ICD-10-CM

## 2022-08-26 LAB — CBC WITH DIFFERENTIAL/PLATELET
Abs Immature Granulocytes: 0.05 10*3/uL (ref 0.00–0.07)
Basophils Absolute: 0.1 10*3/uL (ref 0.0–0.1)
Basophils Relative: 1 %
Eosinophils Absolute: 0.2 10*3/uL (ref 0.0–0.5)
Eosinophils Relative: 3 %
HCT: 38.2 % (ref 36.0–46.0)
Hemoglobin: 12.7 g/dL (ref 12.0–15.0)
Immature Granulocytes: 1 %
Lymphocytes Relative: 17 %
Lymphs Abs: 1.5 10*3/uL (ref 0.7–4.0)
MCH: 30.4 pg (ref 26.0–34.0)
MCHC: 33.2 g/dL (ref 30.0–36.0)
MCV: 91.4 fL (ref 80.0–100.0)
Monocytes Absolute: 0.7 10*3/uL (ref 0.1–1.0)
Monocytes Relative: 9 %
Neutro Abs: 6.1 10*3/uL (ref 1.7–7.7)
Neutrophils Relative %: 69 %
Platelets: 274 10*3/uL (ref 150–400)
RBC: 4.18 MIL/uL (ref 3.87–5.11)
RDW: 14.6 % (ref 11.5–15.5)
WBC: 8.7 10*3/uL (ref 4.0–10.5)
nRBC: 0 % (ref 0.0–0.2)

## 2022-08-26 LAB — BASIC METABOLIC PANEL
Anion gap: 12 (ref 5–15)
BUN: 21 mg/dL (ref 8–23)
CO2: 21 mmol/L — ABNORMAL LOW (ref 22–32)
Calcium: 9.5 mg/dL (ref 8.9–10.3)
Chloride: 104 mmol/L (ref 98–111)
Creatinine, Ser: 1.02 mg/dL — ABNORMAL HIGH (ref 0.44–1.00)
GFR, Estimated: 53 mL/min — ABNORMAL LOW (ref >60)
Glucose, Bld: 116 mg/dL — ABNORMAL HIGH (ref 70–99)
Potassium: 4 mmol/L (ref 3.5–5.1)
Sodium: 137 mmol/L (ref 135–145)

## 2022-08-26 LAB — T4, FREE: Free T4: 1.27 ng/dL — ABNORMAL HIGH (ref 0.61–1.12)

## 2022-08-26 LAB — SARS CORONAVIRUS 2 BY RT PCR: SARS Coronavirus 2 by RT PCR: NEGATIVE

## 2022-08-26 LAB — TROPONIN I (HIGH SENSITIVITY)
Troponin I (High Sensitivity): 13 ng/L (ref <18)
Troponin I (High Sensitivity): 14 ng/L (ref <18)

## 2022-08-26 LAB — TSH: TSH: 3.666 u[IU]/mL (ref 0.350–4.500)

## 2022-08-26 LAB — BRAIN NATRIURETIC PEPTIDE: B Natriuretic Peptide: 618.6 pg/mL — ABNORMAL HIGH (ref 0.0–100.0)

## 2022-08-26 MED ORDER — DIAZEPAM 5 MG PO TABS
5.0000 mg | ORAL_TABLET | Freq: Two times a day (BID) | ORAL | Status: DC | PRN
  Administered 2022-08-26: 5 mg via ORAL
  Filled 2022-08-26: qty 1

## 2022-08-26 MED ORDER — METOPROLOL TARTRATE 25 MG PO TABS
12.5000 mg | ORAL_TABLET | Freq: Two times a day (BID) | ORAL | Status: DC
  Administered 2022-08-27: 12.5 mg via ORAL
  Filled 2022-08-26: qty 1

## 2022-08-26 MED ORDER — ASPIRIN 81 MG PO CHEW
81.0000 mg | CHEWABLE_TABLET | Freq: Every day | ORAL | Status: DC
  Administered 2022-08-27: 81 mg via ORAL
  Filled 2022-08-26: qty 1

## 2022-08-26 MED ORDER — ACETAMINOPHEN 325 MG PO TABS: 650.0000 mg | ORAL_TABLET | Freq: Four times a day (QID) | ORAL | Status: DC | PRN

## 2022-08-26 MED ORDER — PRAVASTATIN SODIUM 40 MG PO TABS
40.0000 mg | ORAL_TABLET | Freq: Every day | ORAL | Status: DC
  Administered 2022-08-27 – 2022-08-28 (×2): 40 mg via ORAL
  Filled 2022-08-26 (×2): qty 1

## 2022-08-26 MED ORDER — ACETAMINOPHEN 650 MG RE SUPP: 650.0000 mg | Freq: Four times a day (QID) | RECTAL | Status: DC | PRN

## 2022-08-26 MED ORDER — FUROSEMIDE 10 MG/ML IJ SOLN
40.0000 mg | Freq: Once | INTRAMUSCULAR | Status: AC
  Administered 2022-08-26: 40 mg via INTRAVENOUS
  Filled 2022-08-26: qty 4

## 2022-08-26 MED ORDER — GABAPENTIN 300 MG PO CAPS
300.0000 mg | ORAL_CAPSULE | Freq: Three times a day (TID) | ORAL | Status: DC
  Administered 2022-08-26 – 2022-08-27 (×2): 300 mg via ORAL
  Filled 2022-08-26 (×3): qty 1

## 2022-08-26 MED ORDER — METOPROLOL TARTRATE 5 MG/5ML IV SOLN
5.0000 mg | Freq: Once | INTRAVENOUS | Status: AC
  Administered 2022-08-26: 5 mg via INTRAVENOUS
  Filled 2022-08-26: qty 5

## 2022-08-26 MED ORDER — LEVOTHYROXINE SODIUM 50 MCG PO TABS
75.0000 ug | ORAL_TABLET | Freq: Every day | ORAL | Status: DC
  Administered 2022-08-27 – 2022-08-29 (×3): 75 ug via ORAL
  Filled 2022-08-26: qty 1
  Filled 2022-08-26: qty 2
  Filled 2022-08-26: qty 1

## 2022-08-26 MED ORDER — APIXABAN 5 MG PO TABS
5.0000 mg | ORAL_TABLET | Freq: Two times a day (BID) | ORAL | Status: DC
  Administered 2022-08-26 – 2022-08-29 (×6): 5 mg via ORAL
  Filled 2022-08-26 (×6): qty 1

## 2022-08-26 MED ORDER — IPRATROPIUM-ALBUTEROL 0.5-2.5 (3) MG/3ML IN SOLN
3.0000 mL | Freq: Once | RESPIRATORY_TRACT | Status: AC
  Administered 2022-08-26: 3 mL via RESPIRATORY_TRACT

## 2022-08-26 MED ORDER — SODIUM CHLORIDE 0.9% FLUSH
3.0000 mL | Freq: Two times a day (BID) | INTRAVENOUS | Status: DC
  Administered 2022-08-26 – 2022-08-29 (×5): 3 mL via INTRAVENOUS

## 2022-08-26 MED ORDER — METOPROLOL TARTRATE 5 MG/5ML IV SOLN: 5.0000 mg | Freq: Once | INTRAVENOUS | Status: DC

## 2022-08-26 MED ORDER — SODIUM CHLORIDE 0.9% FLUSH: 3.0000 mL | INTRAVENOUS | Status: DC | PRN

## 2022-08-26 MED ORDER — METOPROLOL TARTRATE 25 MG PO TABS
12.5000 mg | ORAL_TABLET | Freq: Once | ORAL | Status: AC
  Administered 2022-08-26: 12.5 mg via ORAL
  Filled 2022-08-26: qty 1

## 2022-08-26 MED ORDER — HEPARIN SODIUM (PORCINE) 5000 UNIT/ML IJ SOLN: 5000.0000 [IU] | Freq: Three times a day (TID) | INTRAMUSCULAR | Status: DC

## 2022-08-26 NOTE — Assessment & Plan Note (Signed)
A1c today.  

## 2022-08-26 NOTE — Assessment & Plan Note (Signed)
History of CKD stage III AAA Currently patient's creatinine has improved to 1.02 EGFR 53. Renally dose medications and avoid contrast that was absolutely necessary.

## 2022-08-26 NOTE — ED Provider Notes (Signed)
[facility] Provider Note    Event Date/Time   First MD Initiated Contact with Patient 08/26/22 1821     (approximate)   History   Shortness of Breath   HPI  Nichole Hess is a 89 y.o. female history of hypertension presents to the ER for evaluation of worsening shortness of breath exertional dyspnea and orthopnea over the past few days.  Denies any known history of CHF or A-fib.  She denies any chest pain.  Went to urgent care today was found to be hypoxic with ambulation.  She feels improved with placement of supplemental oxygen.  No history of COPD or bronchitis.     Physical Exam   Triage Vital Signs: ED Triage Vitals  Encounter Vitals Group     BP 08/26/22 1809 (!) 125/93     Systolic BP Percentile --      Diastolic BP Percentile --      Pulse Rate 08/26/22 1809 (!) 129     Resp 08/26/22 1809 (!) 26     Temp 08/26/22 1815 97.6 F (36.4 C)     Temp Source 08/26/22 1809 Oral     SpO2 08/26/22 1809 94 %     Weight 08/26/22 1810 174 lb 2.6 oz (79 kg)     Height 08/26/22 1810 5\' 4"  (1.626 m)     Head Circumference --      Peak Flow --      Pain Score 08/26/22 1810 0     Pain Loc --      Pain Education --      Exclude from Growth Chart --     Most recent vital signs: Vitals:   08/26/22 1830 08/26/22 2006  BP: (!) 138/99 120/89  Pulse: (!) 130 (!) 108  Resp: (!) 23   Temp:    SpO2: 95%      Constitutional: Alert  Eyes: Conjunctivae are normal.  Head: Atraumatic. Nose: No congestion/rhinnorhea. Mouth/Throat: Mucous membranes are moist.   Neck: Painless ROM.  Cardiovascular:   Good peripheral circulation tachycardic irregularly irregular Respiratory: Normal respiratory effort.  Diminished bibasilar breath sounds. Gastrointestinal: Soft and nontender.  Musculoskeletal:  no deformity, bilateral pitting edema. Neurologic:  MAE spontaneously. No gross focal neurologic deficits are appreciated.  Skin:  Skin is warm, dry and intact. No  rash noted. Psychiatric: Mood and affect are normal. Speech and behavior are normal.    ED Results / Procedures / Treatments   Labs (all labs ordered are listed, but only abnormal results are displayed) Labs Reviewed  BASIC METABOLIC PANEL - Abnormal; Notable for the following components:      Result Value   CO2 21 (*)    Glucose, Bld 116 (*)    Creatinine, Ser 1.02 (*)    GFR, Estimated 53 (*)    All other components within normal limits  BRAIN NATRIURETIC PEPTIDE - Abnormal; Notable for the following components:   B Natriuretic Peptide 618.6 (*)    All other components within normal limits  CBC WITH DIFFERENTIAL/PLATELET  TSH  TROPONIN I (HIGH SENSITIVITY)  TROPONIN I (HIGH SENSITIVITY)     EKG  ED ECG REPORT I, Willy Eddy, the attending physician, personally viewed and interpreted this ECG.   Date: 08/26/2022  EKG Time: 18:12  Rate: 125  Rhythm: afib with rvr  Axis: left  Intervals: normal qt  ST&T Change: no stemi, no depression    RADIOLOGY Please see ED Course for my review and interpretation.  I  personally reviewed all radiographic images ordered to evaluate for the above acute complaints and reviewed radiology reports and findings.  These findings were personally discussed with the patient.  Please see medical record for radiology report.    PROCEDURES:  Critical Care performed: Yes, see critical care procedure note(s)  .Critical Care  Performed by: Willy Eddy, MD Authorized by: Willy Eddy, MD   Critical care provider statement:    Critical care time (minutes):  35   Critical care was necessary to treat or prevent imminent or life-threatening deterioration of the following conditions:  Respiratory failure and cardiac failure   Critical care was time spent personally by me on the following activities:  Ordering and performing treatments and interventions, ordering and review of laboratory studies, ordering and review of  radiographic studies, pulse oximetry, re-evaluation of patient's condition, review of old charts, obtaining history from patient or surrogate, examination of patient, evaluation of patient's response to treatment, discussions with primary provider, discussions with consultants and development of treatment plan with patient or surrogate    MEDICATIONS ORDERED IN ED: Medications  metoprolol tartrate (LOPRESSOR) injection 5 mg (5 mg Intravenous Given 08/26/22 1848)  furosemide (LASIX) injection 40 mg (40 mg Intravenous Given 08/26/22 1848)  metoprolol tartrate (LOPRESSOR) tablet 12.5 mg (12.5 mg Oral Given 08/26/22 2006)     IMPRESSION / MDM / ASSESSMENT AND PLAN / ED COURSE  I reviewed the triage vital signs and the nursing notes.                              Differential diagnosis includes, but is not limited to, Asthma, copd, CHF, pna, ptx, malignancy, Pe, anemia  Patient presenting to the ER for evaluation of symptoms as described above.  Based on symptoms, risk factors and considered above differential, this presenting complaint could reflect a potentially life-threatening illness therefore the patient will be placed on continuous pulse oximetry and telemetry for monitoring.  Laboratory evaluation will be sent to evaluate for the above complaints.  Shuaib from urgent care my review and interpretation with evidence of mild edema.  I think this more likely mild CHF in the setting of new onset A-fib.  Will give IV metoprolol for rate control as she is in RVR.    Clinical Course as of 08/26/22 2008  Tue Aug 26, 2022  1912 Reassessed.  Remains normotensive.  Heart rates now in the 90s after Lopressor.  Given her good response to beta-blocker will give metoprolol p.o. [PR]  2007 BNP is mildly elevated.  Rate controlled improving with IV and p.o. metoprolol.  I have consulted with hospitalist for admission. [PR]    Clinical Course User Index [PR] Willy Eddy, MD     FINAL CLINICAL  IMPRESSION(S) / ED DIAGNOSES   Final diagnoses:  Atrial fibrillation with RVR (HCC)  Acute respiratory failure with hypoxia (HCC)     Rx / DC Orders   ED Discharge Orders     None        Note:  This document was prepared using Dragon voice recognition software and may include unintentional dictation errors.    Willy Eddy, MD 08/26/22 2008

## 2022-08-26 NOTE — Assessment & Plan Note (Signed)
SpO2: 95 % O2 Flow Rate (L/min): 3 L/min 2/2 to PVC and expect it will clear , don to suspect any reason for home O2.  We will however check dimer.

## 2022-08-26 NOTE — Assessment & Plan Note (Signed)
Home regimen consist of Coreg 12.5 twice daily, losartan 50, amlodipine 2.5. Will currently continue patient on aspirin 81, metoprolol.  Will currently hold amlodipine and losartan until diuresis is complete and patient is euvolemic and then resume ACE or ARB therapy or GDMT as deemed appropriate per AM or  discharging team.

## 2022-08-26 NOTE — ED Provider Notes (Addendum)
[facility]    CSN: [id number] Arrival date & time: 08/26/22  1455      History   Chief Complaint Chief Complaint  Patient presents with   Shortness of Breath   Cough    HPI Nichole Hess is a 89 y.o. female.   HPI  89 year old female with a past medical history significant for hypertension, breast cancer with lumpectomy, and thyroid disease presents for evaluation of shortness of breath off and on for the past year.  She reports that in the last 3 days she has developed runny nose, nasal congestion for thick clear mucus and postnasal drip.  This is leading to shortness of breath and wheezing.  She denies any fever or cough.  Patient is able to speak in full sentences without dyspnea or tachypnea.  Past Medical History:  Diagnosis Date   Breast cancer (HCC) 06/05/99   lumpectomy-positive   Hypertension    Personal history of radiation therapy    Thyroid disease     There are no problems to display for this patient.   Past Surgical History:  Procedure Laterality Date   BREAST BIOPSY Left 06/05/99   lumpectomy/rad   BREAST LUMPECTOMY Left 2001   CHOLECYSTECTOMY      OB History   No obstetric history on file.      Home Medications    Prior to Admission medications   Medication Sig Start Date End Date Taking? Authorizing Provider  amLODipine (NORVASC) 2.5 MG tablet Take by mouth. 02/11/17  Yes [provider]  aspirin 81 MG chewable tablet Chew by mouth daily.   Yes [provider]  carvedilol (COREG) 12.5 MG tablet Take by mouth. 02/11/17  Yes [provider]  diazepam (VALIUM) 5 MG tablet Take 1 tablet (5 mg total) by mouth every 12 (twelve) hours as needed for anxiety. 10/16/18  Yes Cook, Jayce G, DO  gabapentin (NEURONTIN) 300 MG capsule Take 1 capsule (300 mg total) by mouth 3 (three) times daily for 7 days. 06/02/17 08/26/22 Yes Lurline Idol, FNP  levothyroxine (SYNTHROID, LEVOTHROID) 25 MCG tablet Take 75 mcg by mouth  daily before breakfast.    Yes [provider]  losartan (COZAAR) 50 MG tablet Take 1 tablet by mouth daily. 08/31/18  Yes [provider]  meclizine (ANTIVERT) 25 MG tablet Take 1 tablet (25 mg total) by mouth 3 (three) times daily as needed for dizziness. 04/20/17  Yes Cook, Verdis Frederickson, DO  Multiple Vitamin (MULTI-VITAMINS) TABS Take by mouth.   Yes [provider]  pravastatin (PRAVACHOL) 40 MG tablet Take by mouth. 02/11/17  Yes [provider]  traMADol (ULTRAM) 50 MG tablet Take 1 tablet (50 mg total) by mouth every 12 (twelve) hours as needed for moderate pain or severe pain. Do not take with muscle relaxant 10/14/18  Yes Renford Dills, NP    Family History Family History  Problem Relation Age of Onset   Other Mother        sepsis   Hypertension Mother    Heart attack Father 60   Breast cancer Neg Hx     Social History Social History   Tobacco Use   Smoking status: Former    Current packs/day: 0.00    Average packs/day: 1 pack/day for 10.0 years (10.0 ttl pk-yrs)    Types: Cigarettes    Start date: 10/14/1958    Quit date: 10/13/1968    Years since quitting: 53.9   Smokeless tobacco: Never  Vaping Use  Vaping status: Never Used  Substance Use Topics   Alcohol use: No   Drug use: No     Allergies   Patient has no known allergies.   Review of Systems Review of Systems  Constitutional:  Negative for fever.  HENT:  Positive for congestion, postnasal drip and rhinorrhea. Negative for ear pain and sore throat.   Respiratory:  Positive for shortness of breath and wheezing. Negative for cough.      Physical Exam Triage Vital Signs ED Triage Vitals  Encounter Vitals Group     BP      Systolic BP Percentile      Diastolic BP Percentile      Pulse      Resp      Temp      Temp src      SpO2      Weight      Height      Head Circumference      Peak Flow      Pain Score      Pain Loc      Pain Education      Exclude from  Growth Chart    No data found.  Updated Vital Signs BP 135/87   Pulse 84   Temp 98.1 F (36.7 C)   Resp (!) 24   SpO2 91%   Visual Acuity Right Eye Distance:   Left Eye Distance:   Bilateral Distance:    Right Eye Near:   Left Eye Near:    Bilateral Near:     Physical Exam Vitals and nursing note reviewed.  Constitutional:      Appearance: Normal appearance. She is not ill-appearing.  HENT:     Head: Normocephalic and atraumatic.     Right Ear: Tympanic membrane, ear canal and external ear normal. There is no impacted cerumen.     Left Ear: Tympanic membrane, ear canal and external ear normal. There is no impacted cerumen.     Nose: Congestion and rhinorrhea present.     Comments: Nasal mucosa is edematous with clear discharge in both nares.    Mouth/Throat:     Mouth: Mucous membranes are moist.     Pharynx: Oropharynx is clear. Posterior oropharyngeal erythema present. No oropharyngeal exudate.     Comments: Mild erythema to the posterior pharynx with clear postnasal drip. Cardiovascular:     Rate and Rhythm: Normal rate and regular rhythm.     Pulses: Normal pulses.     Heart sounds: Normal heart sounds. No murmur heard.    No friction rub. No gallop.  Pulmonary:     Effort: Pulmonary effort is normal.     Breath sounds: Wheezing present. No rhonchi or rales.  Musculoskeletal:     Cervical back: Normal range of motion and neck supple.  Skin:    General: Skin is warm and dry.     Capillary Refill: Capillary refill takes less than 2 seconds.     Findings: No erythema or rash.  Neurological:     General: No focal deficit present.     Mental Status: She is alert and oriented to person, place, and time.      UC Treatments / Results  Labs (all labs ordered are listed, but only abnormal results are displayed) Labs Reviewed  SARS CORONAVIRUS 2 BY RT PCR    EKG Atrial fibrillation with RVR rate 111 bpm.  Left axis deviation and left bundle branch  block.  Radiology No results found.  Procedures Procedures (including critical care time)  Medications Ordered in UC Medications  ipratropium-albuterol (DUONEB) 0.5-2.5 (3) MG/3ML nebulizer solution 3 mL (3 mLs Nebulization Given 08/26/22 1548)    Initial Impression / Assessment and Plan / UC Course  I have reviewed the triage vital signs and the nursing notes.  Pertinent labs & imaging results that were available during my care of the patient were reviewed by me and considered in my medical decision making (see chart for details).   Patient is a pleasant 89 year old female presenting for evaluation of 3 days worth of shortness of breath as outlined HPI above.  In triage her respiratory rate is 22 and her room air oxygen saturation is 88%.  She does not have any history of asthma or COPD.  She reports that she has been having off-and-on shortness breath over the last year.  She did see her PCP approximately 1 month ago and she was given Flonase but that has not provided any relief.  She denies any cough but does have upper respiratory congestion with clear rhinorrhea and clear postnasal drip.  On exam patient is able to speak in full sentence without dyspnea or tachypnea.  She does have diffuse wheezing in all lung fields but no rales or rhonchi.  There is inflammation of her nasal mucosa with clear rhinorrhea as well.  I have asked staff place patient on oxygen via 2 L nasal cannula and her oxygen saturation has come up to 92%.  I will order DuoNeb to help open the patient up and see if this improves her respiratory status.  I will also order a chest x-ray and a COVID PCR.  COVID PCR is negative.  Patient was markedly dyspneic upon return from x-ray and initial room air sats were in the 70s.  Patient placed back on oxygen at 2 L by nasal cannula which brought her oxygen saturation to 91%.  She remains tachypneic with a respiratory rate of 24.  Chest x-ray independently reviewed and evaluated  by me.  Pression: There is a basilar effusion on the left and blunting of the costophrenic angles on the right.  Pulmonary vasculature is prominent.  Radiology overread is pending.  EKG has been ordered.  Patient remains mildly dyspneic in the exam room.  Her oxygen saturation on 2 L is 90%.  Given her continued hypoxia and the pleural effusions on chest x-ray I have recommended that she go to the emergency department for evaluation.  I will have staff call EMS and transported to [facility] for hypoxia and new pleural effusion.  I have called and given report to Lillia Abed, the person covering for charge at [facility].  EKG obtained shows atrial fibrillation with RVR and left bundle branch block.  Preparing the report, patient has worsening dyspnea when she lays flat, which she did not tell us that intake.  Last night family was concerned enough that they contemplated calling EMS.  Report given to [facility].  Care transferred.   Final Clinical Impressions(s) / UC Diagnoses   Final diagnoses:  Shortness of breath  Hypoxia  Pleural effusion  Atrial fibrillation with RVR [facility])     Discharge Instructions      Please go to the ER for evaluation of your shortness of breath and hypoxia.     ED Prescriptions   None    PDMP not reviewed this encounter.   Becky Augusta, NP 08/26/22 1701    Becky Augusta, NP 08/26/22 1710    Becky Augusta, NP  08/26/22 1718    Becky Augusta, NP 08/26/22 1728

## 2022-08-26 NOTE — H&P (Incomplete)
History and Physical    Patient: Nichole Hess JYN:[id number] DOB: [birth year] DOA: 08/26/2022 DOS: the patient was seen and examined on 08/27/2022 PCP: Luciana Axe, NP  Patient coming from: Home   Chief Complaint:  Chief Complaint  Patient presents with   Shortness of Breath   HPI: Nichole Hess is a 89 y.o. female with medical history significant for hypertension, hypothyroidism presenting with complaints of shortness of breath off and on for the past few weeks.  Primary care note states shortness of breath for about a year patient to me states that she is only been short of breath for a few weeks.  Although she has been having leg swelling for a long time and she has been wearing compression stockings for her feet and ankle swelling.  Patient states over the past few days especially last night she has been having orthopnea and cannot sleep at night when she lays flat completely.  She does state that she intermittently eats a lot of salt.  Patient does not have any history of asthma or COPD at PCPs office patient's O2 sat was noted to be 88% and was started on 2 L where her O2 sats increased to 92%.  COVID testing was negative at PCP office.  Chest x-ray did show bilateral effusion worse on the left and pulmonary vascular congestion.  EKG showed A-fib RVR and a left bundle branch block. Patient's vital on arrival here at the hospital showed A-fib RVR at 129 respirations 26 O2 sats 94% on 2 L nasal cannula.  EKG showed A-fib RVR with a left axis deviation heart rate of 125.  Patient was started on metoprolol and given Lasix in the emergency room and had good response with a heart rate coming down to the 90s.  Admission was requested for new onset A-fib and CHF. Blood work shows glucose of 116 CKD stage IIIa with a  creatinine of 1.02 EGFR of 53, hepatic function added on and pending, BNP of 618 and normal troponins, CBC within normal limits.  Review of Systems: Review of Systems   Respiratory:  Positive for shortness of breath.   Cardiovascular:  Positive for leg swelling.  All other systems reviewed and are negative.   Past Medical History:  Diagnosis Date   Breast cancer (HCC) 06/05/1999   lumpectomy-positive   Hypertension    Personal history of radiation therapy    Thyroid disease    Past Surgical History:  Procedure Laterality Date   BREAST BIOPSY Left 06/05/99   lumpectomy/rad   BREAST LUMPECTOMY Left 2001   CHOLECYSTECTOMY     Social History:   reports that she quit smoking about 53 years ago. Her smoking use included cigarettes. She started smoking about 63 years ago. She has a 10 pack-year smoking history. She has never used smokeless tobacco. She reports that she does not drink alcohol and does not use drugs.  No Known Allergies  Family History  Problem Relation Age of Onset   Other Mother        sepsis   Hypertension Mother    Heart attack Father 14   Breast cancer Neg Hx     Prior to Admission medications   Medication Sig Start Date End Date Taking? Authorizing Provider  amLODipine (NORVASC) 2.5 MG tablet Take by mouth. 02/11/17   [provider]  aspirin 81 MG chewable tablet Chew by mouth daily.    [provider]  carvedilol (COREG) 12.5 MG tablet Take by mouth. 02/11/17  [provider]  diazepam (VALIUM) 5 MG tablet Take 1 tablet (5 mg total) by mouth every 12 (twelve) hours as needed for anxiety. 10/16/18   Tommie Sams, DO  gabapentin (NEURONTIN) 300 MG capsule Take 1 capsule (300 mg total) by mouth 3 (three) times daily for 7 days. 06/02/17 08/26/22  Lurline Idol, FNP  levothyroxine (SYNTHROID, LEVOTHROID) 25 MCG tablet Take 75 mcg by mouth daily before breakfast.     [provider]  losartan (COZAAR) 50 MG tablet Take 1 tablet by mouth daily. 08/31/18   [provider]  meclizine (ANTIVERT) 25 MG tablet Take 1 tablet (25 mg total) by mouth 3 (three) times daily as needed for  dizziness. 04/20/17   Tommie Sams, DO  Multiple Vitamin (MULTI-VITAMINS) TABS Take by mouth.    [provider]  pravastatin (PRAVACHOL) 40 MG tablet Take by mouth. 02/11/17   [provider]  traMADol (ULTRAM) 50 MG tablet Take 1 tablet (50 mg total) by mouth every 12 (twelve) hours as needed for moderate pain or severe pain. Do not take with muscle relaxant 10/14/18   Renford Dills, NP     Vitals:   08/26/22 2200 08/26/22 2221 08/26/22 2300 08/26/22 2330  BP: 127/84  111/82 (!) 108/55  Pulse: 87  69 95  Resp:   20 19  Temp:  (!) 97.5 F (36.4 C)    TempSrc:  Oral    SpO2: 96%  96% 96%  Weight:      Height:       Physical Exam Vitals and nursing note reviewed.  Constitutional:      General: She is not in acute distress.    Interventions: Nasal cannula in place.  HENT:     Head: Normocephalic and atraumatic.     Right Ear: Hearing normal.     Left Ear: Hearing normal.     Nose: Nose normal. No nasal deformity.     Mouth/Throat:     Lips: Pink.     Tongue: No lesions.     Pharynx: Oropharynx is clear.  Eyes:     General: Lids are normal.     Extraocular Movements: Extraocular movements intact.  Cardiovascular:     Rate and Rhythm: Normal rate and regular rhythm.     Heart sounds: Normal heart sounds.  Pulmonary:     Effort: Pulmonary effort is normal. No tachypnea, bradypnea, accessory muscle usage, prolonged expiration or respiratory distress.     Breath sounds: Examination of the right-upper field reveals wheezing. Examination of the left-upper field reveals wheezing. Examination of the right-middle field reveals wheezing. Examination of the left-middle field reveals wheezing. Examination of the right-lower field reveals rales. Examination of the left-lower field reveals rales. Wheezing and rales present.  Abdominal:     General: Bowel sounds are normal. There is no distension.     Palpations: Abdomen is soft. There is no mass.     Tenderness: There is  no abdominal tenderness.  Musculoskeletal:     Right lower leg: No edema.     Left lower leg: No edema.  Skin:    General: Skin is warm.  Neurological:     General: No focal deficit present.     Mental Status: She is alert and oriented to person, place, and time.     Cranial Nerves: Cranial nerves 2-12 are intact.  Psychiatric:        Attention and Perception: Attention normal.        Mood  and Affect: Mood normal.        Speech: Speech normal.        Behavior: Behavior normal. Behavior is cooperative.    Labs on Admission: I have personally reviewed following labs and imaging studies.  CBC: Recent Labs  Lab 08/26/22 1817  WBC 8.7  NEUTROABS 6.1  HGB 12.7  HCT 38.2  MCV 91.4  PLT 274   Basic Metabolic Panel: Recent Labs  Lab 08/26/22 1817  NA 137  K 4.0  CL 104  CO2 21*  GLUCOSE 116*  BUN 21  CREATININE 1.02*  CALCIUM 9.5   GFR: Estimated Creatinine Clearance: 39.5 mL/min (A) (by C-G formula based on SCr of 1.02 mg/dL (H)). Liver Function Tests: No results for input(s): "AST", "ALT", "ALKPHOS", "BILITOT", "PROT", "ALBUMIN" in the last 168 hours. No results for input(s): "LIPASE", "AMYLASE" in the last 168 hours. No results for input(s): "AMMONIA" in the last 168 hours. Coagulation Profile: No results for input(s): "INR", "PROTIME" in the last 168 hours. Cardiac Enzymes: No results for input(s): "CKTOTAL", "CKMB", "CKMBINDEX", "TROPONINI" in the last 168 hours. BNP (last 3 results) No results for input(s): "PROBNP" in the last 8760 hours. HbA1C: No results for input(s): "HGBA1C" in the last 72 hours. CBG: No results for input(s): "GLUCAP" in the last 168 hours. Lipid Profile: No results for input(s): "CHOL", "HDL", "LDLCALC", "TRIG", "CHOLHDL", "LDLDIRECT" in the last 72 hours. Thyroid Function Tests: Recent Labs    08/26/22 1833 08/26/22 2012  TSH  --  3.666  FREET4 1.27*  --    Anemia Panel: No results for input(s): "VITAMINB12", "FOLATE",  "FERRITIN", "TIBC", "IRON", "RETICCTPCT" in the last 72 hours. Urinalysis    Component Value Date/Time   COLORURINE YELLOW 05/15/2012 0814   APPEARANCEUR HAZY 05/15/2012 0814   LABSPEC 1.015 05/15/2012 0814   PHURINE 6.5 05/15/2012 0814   GLUCOSEU NEGATIVE 05/15/2012 0814   HGBUR 2+ 05/15/2012 0814   BILIRUBINUR NEGATIVE 05/15/2012 0814   KETONESUR NEGATIVE 05/15/2012 0814   PROTEINUR NEGATIVE 05/15/2012 0814   NITRITE NEGATIVE 05/15/2012 0814   LEUKOCYTESUR 3+ 05/15/2012 0814    Unresulted Labs (From admission, onward)     Start     Ordered   08/27/22 0500  Comprehensive metabolic panel  Tomorrow morning,   R        08/26/22 2040   08/27/22 0500  CBC  Tomorrow morning,   R        08/26/22 2040           Medications  aspirin chewable tablet 81 mg (has no administration in time range)  gabapentin (NEURONTIN) capsule 300 mg (300 mg Oral Given 08/26/22 2222)  diazepam (VALIUM) tablet 5 mg (5 mg Oral Given 08/26/22 2222)  levothyroxine (SYNTHROID) tablet 75 mcg (has no administration in time range)  pravastatin (PRAVACHOL) tablet 40 mg (has no administration in time range)  acetaminophen (TYLENOL) tablet 650 mg (has no administration in time range)    Or  acetaminophen (TYLENOL) suppository 650 mg (has no administration in time range)  sodium chloride flush (NS) 0.9 % injection 3 mL (3 mLs Intravenous Given 08/26/22 2221)  sodium chloride flush (NS) 0.9 % injection 3 mL (has no administration in time range)  metoprolol tartrate (LOPRESSOR) tablet 12.5 mg (has no administration in time range)  apixaban (ELIQUIS) tablet 5 mg (5 mg Oral Given 08/26/22 2222)  furosemide (LASIX) injection 20 mg (has no administration in time range)  metoprolol tartrate (LOPRESSOR) injection 5 mg (5 mg Intravenous Given  08/26/22 1848)  furosemide (LASIX) injection 40 mg (40 mg Intravenous Given 08/26/22 1848)  metoprolol tartrate (LOPRESSOR) tablet 12.5 mg (12.5 mg Oral Given 08/26/22 2006)     Radiological Exams on Admission: DG Chest 2 View  Result Date: 08/26/2022 CLINICAL DATA:  Cough and shortness of breath x 3 days. EXAM: CHEST - 2 VIEW COMPARISON:  None Available. FINDINGS: The heart and mediastinal contours are within normal limits. Prominent main pulmonary artery. Atherosclerotic plaque. Hyperinflation of the lungs with flattening of the bilateral hemidiaphragms. No focal consolidation. Chronic coarsened interstitial markings with no overt pulmonary edema. Bilateral at least trace pleural effusions. No pneumothorax. No acute osseous abnormality. IMPRESSION: 1. At least trace bilateral pleural effusions. 2. Prominent main pulmonary artery which may be due to positioning versus pulmonary hypertension. 3. Aortic Atherosclerosis (ICD10-I70.0) and Emphysema (ICD10-J43.9). Electronically Signed   By: Tish Frederickson M.D.   On: 08/26/2022 17:31     Data Reviewed: Relevant notes from primary care and specialist visits, past discharge summaries as available in EHR, including Care Everywhere. Prior diagnostic testing as pertinent to current admission diagnoses Updated medications and problem lists for reconciliation ED course, including vitals, labs, imaging, treatment and response to treatment Triage notes, nursing and pharmacy notes and ED provider's notes Notable results as noted in HPI  Assessment and Plan: * SOB (shortness of breath) Secondary to new CHF in the setting of A-fib RVR patient presenting with pulmonary vascular congestions pleural effusion and has been having lower extremity edema and orthopnea symptoms. Patient started on Lasix in the emergency room.  Will obtain a 2D echocardiogram with bubble study and continue diuretic therapy as deemed appropriate sterile strict I's and O's Daily weights. Low-sodium diet. 2D echocardiogram. Cardiology consult once stable.  Paroxysmal atrial fibrillation with RVR (HCC) CHA2DS2/VAS Stroke Risk Points  Current as of 11  minutes ago     4 >= 2 Points: High Risk  1 to 1.99 Points: Medium Risk  0 Points: Low Risk    No Change      Details    This score determines the patient's risk of having a stroke if the  patient has atrial fibrillation.       Points Metrics  0 Has Congestive Heart Failure:  No    Current as of 11 minutes ago  0 Has Vascular Disease:  No    Current as of 11 minutes ago  1 Has Hypertension:  Yes    Current as of 11 minutes ago  2 Age:  46    Current as of 11 minutes ago  0 Has Diabetes:  No    Current as of 11 minutes ago  0 Had Stroke:  No  Had TIA:  No  Had Thromboembolism:  No    Current as of 11 minutes ago  1 Female:  Yes    Current as of 11 minutes ago     Pt coming in with new onset a.fib.   D/w her abot risk of stroke / VTE and pt agrees with plan for anticoagulation. And wishes to be full code.  Pt started on eliquis 5 mg bid. Pt continued on metoprolol.   Acute respiratory failure with hypoxia (HCC) SpO2: 95 % O2 Flow Rate (L/min): 3 L/min 2/2 to PVC and expect it will clear , don to suspect any reason for home O2.  We will however check dimer.   Prediabetes A1c today.    Hypothyroidism Cont levothyroxine 75 mcg.    Hypertension, essential, benign  We will continue pt on metoprolol and resume home meds once euvolemic.   CKD (chronic kidney disease) stage 3, GFR 30-59 ml/min (HCC) Currently patient's creatinine has improved to 1.02 EGFR 53. Renally dose medications and avoid contrast that was absolutely necessary.   Hypertension Home regimen consist of Coreg 12.5 twice daily, losartan 50, amlodipine 2.5. Will currently continue patient on aspirin 81, metoprolol.  Will currently hold amlodipine and losartan until diuresis is complete and patient is euvolemic and then resume ACE or ARB therapy or GDMT as deemed appropriate per AM or  discharging team.   Prognosis: Fair   DVT prophylaxis:  Eliquis.   Consults:  None.   Advance Care Planning:     Code Status: Full Code   Family Communication:  None   Disposition Plan:  Home   Severity of Illness: The appropriate patient status for this patient is OBSERVATION. Observation status is judged to be reasonable and necessary in order to provide the required intensity of service to ensure the patient's safety. The patient's presenting symptoms, physical exam findings, and initial radiographic and laboratory data in the context of their medical condition is felt to place them at decreased risk for further clinical deterioration. Furthermore, it is anticipated that the patient will be medically stable for discharge from the hospital within 2 midnights of admission.   Author: Gertha Calkin, MD 08/27/2022 1:59 AM  For on call review [email or web address].

## 2022-08-26 NOTE — Assessment & Plan Note (Addendum)
Secondary to new CHF in the setting of A-fib RVR patient presenting with pulmonary vascular congestions pleural effusion and has been having lower extremity edema and orthopnea symptoms. Patient started on Lasix in the emergency room.  Will obtain a 2D echocardiogram with bubble study and continue diuretic therapy as deemed appropriate sterile strict I's and O's Daily weights. Low-sodium diet. 2D echocardiogram. Cardiology consult once stable.

## 2022-08-26 NOTE — ED Triage Notes (Signed)
Pt to ED via [facility] from [facility] for shortness of breath that has been worse over the last 4 days. Pts room air sats were in the 70's after ambulating to the room. Pts CXR showed bilateral pleural effusions. EKG showed A. Fib with RVR, pt does not have history of same. EMS reports pt has fine rales bilaterally. Pts HR was between 75-175 with EMS. Pt is currently on 3 liters of oxygen. Pt is in NAD. Pt was given 1 duoneb at  urgent care. Pt has a 20 G IV in her LAC.

## 2022-08-26 NOTE — ED Notes (Signed)
Pt return from xr SOB, wheezing more and tachypnic. VS pox 92%, HR 97, RR 26, 135/87. After neb tx pt has increase aeration with bilat crackles in bases.

## 2022-08-26 NOTE — ED Notes (Signed)
Patient is being discharged from the Urgent Care and sent to the [facility] Emergency Department via EMS . Per Becky Augusta, NP, patient is in need of higher level of care due to Shortness of breath and bilat pleural effusion. Patient is aware and verbalizes understanding of plan of care.  Vitals:   08/26/22 1533 08/26/22 1629  BP: (!) 147/88 135/87  Pulse: 90 84  Resp: (!) 22 (!) 24  Temp: 98.1 F (36.7 C)   SpO2:  91%

## 2022-08-26 NOTE — ED Triage Notes (Signed)
Pt c/o trouble breathing off and on x 1 year. Was seen by PMD 1 mth ago and was given flonase and no relief

## 2022-08-26 NOTE — Assessment & Plan Note (Addendum)
Patient with new onset A-fib with RVR, concern of dilated cardiomyopathy and elevated right heart pressures consistent with pulmonary hypertension.   CHADSVASC at least 5 (agex2, female, HTN, CHF)  Cardiology is on board. -Continue with metoprolol-titrate for better rate control and as blood pressure tolerated. -Continue with Eliquis

## 2022-08-26 NOTE — Discharge Instructions (Signed)
Please go to the ER for evaluation of your shortness of breath and hypoxia.

## 2022-08-26 NOTE — Assessment & Plan Note (Signed)
Cont levothyroxine 75 mcg.

## 2022-08-27 ENCOUNTER — Encounter: Payer: Self-pay | Admitting: Internal Medicine

## 2022-08-27 ENCOUNTER — Observation Stay (HOSPITAL_COMMUNITY)
Admit: 2022-08-27 | Discharge: 2022-08-27 | Disposition: A | Discharge status: Home / Self Care | Payer: Medicare HMO | Attending: Internal Medicine | Admitting: Internal Medicine

## 2022-08-27 DIAGNOSIS — E039 Hypothyroidism, unspecified: Secondary | ICD-10-CM

## 2022-08-27 DIAGNOSIS — I10 Essential (primary) hypertension: Secondary | ICD-10-CM

## 2022-08-27 DIAGNOSIS — J810 Acute pulmonary edema: Secondary | ICD-10-CM

## 2022-08-27 DIAGNOSIS — I5021 Acute systolic (congestive) heart failure: Secondary | ICD-10-CM | POA: Diagnosis not present

## 2022-08-27 DIAGNOSIS — N183 Chronic kidney disease, stage 3 unspecified: Secondary | ICD-10-CM | POA: Diagnosis not present

## 2022-08-27 DIAGNOSIS — Z923 Personal history of irradiation: Secondary | ICD-10-CM | POA: Diagnosis not present

## 2022-08-27 DIAGNOSIS — Z7982 Long term (current) use of aspirin: Secondary | ICD-10-CM | POA: Diagnosis not present

## 2022-08-27 DIAGNOSIS — I447 Left bundle-branch block, unspecified: Secondary | ICD-10-CM | POA: Diagnosis present

## 2022-08-27 DIAGNOSIS — I4891 Unspecified atrial fibrillation: Secondary | ICD-10-CM | POA: Diagnosis present

## 2022-08-27 DIAGNOSIS — I272 Pulmonary hypertension, unspecified: Secondary | ICD-10-CM | POA: Diagnosis present

## 2022-08-27 DIAGNOSIS — Z7901 Long term (current) use of anticoagulants: Secondary | ICD-10-CM | POA: Diagnosis not present

## 2022-08-27 DIAGNOSIS — Z1152 Encounter for screening for COVID-19: Secondary | ICD-10-CM | POA: Diagnosis not present

## 2022-08-27 DIAGNOSIS — J9601 Acute respiratory failure with hypoxia: Secondary | ICD-10-CM

## 2022-08-27 DIAGNOSIS — N1831 Chronic kidney disease, stage 3a: Secondary | ICD-10-CM | POA: Diagnosis present

## 2022-08-27 DIAGNOSIS — I340 Nonrheumatic mitral (valve) insufficiency: Secondary | ICD-10-CM

## 2022-08-27 DIAGNOSIS — R7303 Prediabetes: Secondary | ICD-10-CM | POA: Diagnosis present

## 2022-08-27 DIAGNOSIS — Z8249 Family history of ischemic heart disease and other diseases of the circulatory system: Secondary | ICD-10-CM | POA: Diagnosis not present

## 2022-08-27 DIAGNOSIS — Z853 Personal history of malignant neoplasm of breast: Secondary | ICD-10-CM | POA: Diagnosis not present

## 2022-08-27 DIAGNOSIS — I420 Dilated cardiomyopathy: Secondary | ICD-10-CM | POA: Diagnosis present

## 2022-08-27 DIAGNOSIS — Z87891 Personal history of nicotine dependence: Secondary | ICD-10-CM | POA: Diagnosis not present

## 2022-08-27 DIAGNOSIS — Z9049 Acquired absence of other specified parts of digestive tract: Secondary | ICD-10-CM | POA: Diagnosis not present

## 2022-08-27 DIAGNOSIS — Z79899 Other long term (current) drug therapy: Secondary | ICD-10-CM | POA: Diagnosis not present

## 2022-08-27 DIAGNOSIS — K5900 Constipation, unspecified: Secondary | ICD-10-CM | POA: Diagnosis not present

## 2022-08-27 DIAGNOSIS — I361 Nonrheumatic tricuspid (valve) insufficiency: Secondary | ICD-10-CM

## 2022-08-27 DIAGNOSIS — E7800 Pure hypercholesterolemia, unspecified: Secondary | ICD-10-CM | POA: Diagnosis present

## 2022-08-27 DIAGNOSIS — I5041 Acute combined systolic (congestive) and diastolic (congestive) heart failure: Secondary | ICD-10-CM | POA: Diagnosis present

## 2022-08-27 DIAGNOSIS — I081 Rheumatic disorders of both mitral and tricuspid valves: Secondary | ICD-10-CM | POA: Diagnosis present

## 2022-08-27 DIAGNOSIS — I129 Hypertensive chronic kidney disease with stage 1 through stage 4 chronic kidney disease, or unspecified chronic kidney disease: Secondary | ICD-10-CM | POA: Diagnosis present

## 2022-08-27 DIAGNOSIS — Z79890 Hormone replacement therapy (postmenopausal): Secondary | ICD-10-CM | POA: Diagnosis not present

## 2022-08-27 DIAGNOSIS — R0602 Shortness of breath: Secondary | ICD-10-CM | POA: Diagnosis present

## 2022-08-27 LAB — COMPREHENSIVE METABOLIC PANEL
ALT: 18 U/L (ref 0–44)
AST: 20 U/L (ref 15–41)
Albumin: 3.2 g/dL — ABNORMAL LOW (ref 3.5–5.0)
Alkaline Phosphatase: 58 U/L (ref 38–126)
Anion gap: 3 — ABNORMAL LOW (ref 5–15)
BUN: 22 mg/dL (ref 8–23)
CO2: 25 mmol/L (ref 22–32)
Calcium: 8.8 mg/dL — ABNORMAL LOW (ref 8.9–10.3)
Chloride: 113 mmol/L — ABNORMAL HIGH (ref 98–111)
Creatinine, Ser: 1.07 mg/dL — ABNORMAL HIGH (ref 0.44–1.00)
GFR, Estimated: 50 mL/min — ABNORMAL LOW (ref >60)
Glucose, Bld: 86 mg/dL (ref 70–99)
Potassium: 3.5 mmol/L (ref 3.5–5.1)
Sodium: 141 mmol/L (ref 135–145)
Total Bilirubin: 0.6 mg/dL (ref 0.3–1.2)
Total Protein: 6 g/dL — ABNORMAL LOW (ref 6.5–8.1)

## 2022-08-27 LAB — HEMOGLOBIN A1C
Hgb A1c MFr Bld: 5.6 % (ref 4.8–5.6)
Mean Plasma Glucose: 114.02 mg/dL

## 2022-08-27 LAB — CBC
HCT: 34.9 % — ABNORMAL LOW (ref 36.0–46.0)
Hemoglobin: 11.4 g/dL — ABNORMAL LOW (ref 12.0–15.0)
MCH: 30.7 pg (ref 26.0–34.0)
MCHC: 32.7 g/dL (ref 30.0–36.0)
MCV: 94.1 fL (ref 80.0–100.0)
Platelets: 238 10*3/uL (ref 150–400)
RBC: 3.71 MIL/uL — ABNORMAL LOW (ref 3.87–5.11)
RDW: 14.6 % (ref 11.5–15.5)
WBC: 7.2 10*3/uL (ref 4.0–10.5)
nRBC: 0 % (ref 0.0–0.2)

## 2022-08-27 LAB — ECHOCARDIOGRAM COMPLETE BUBBLE STUDY
AR max vel: 2.33 cm2
AV Area VTI: 2.4 cm2
AV Area mean vel: 2.37 cm2
AV Mean grad: 3.3 mmHg
AV Peak grad: 8.1 mmHg
Ao pk vel: 1.42 m/s
Area-P 1/2: 2.7 cm2
MV VTI: 2.08 cm2
S' Lateral: 3.4 cm

## 2022-08-27 LAB — MAGNESIUM: Magnesium: 2 mg/dL (ref 1.7–2.4)

## 2022-08-27 MED ORDER — METOPROLOL TARTRATE 25 MG PO TABS
25.0000 mg | ORAL_TABLET | Freq: Two times a day (BID) | ORAL | Status: DC
  Administered 2022-08-27 – 2022-08-29 (×4): 25 mg via ORAL
  Filled 2022-08-27 (×4): qty 1

## 2022-08-27 MED ORDER — FUROSEMIDE 10 MG/ML IJ SOLN
20.0000 mg | Freq: Two times a day (BID) | INTRAMUSCULAR | Status: AC
  Administered 2022-08-27 – 2022-08-28 (×4): 20 mg via INTRAVENOUS
  Filled 2022-08-27: qty 4
  Filled 2022-08-27 (×3): qty 2

## 2022-08-27 MED ORDER — DILTIAZEM HCL 25 MG/5ML IV SOLN
10.0000 mg | Freq: Once | INTRAVENOUS | Status: AC
  Administered 2022-08-27: 10 mg via INTRAVENOUS
  Filled 2022-08-27: qty 5

## 2022-08-27 NOTE — ED Notes (Signed)
Oxygen turned off by Dr Nelson Chimes

## 2022-08-27 NOTE — Assessment & Plan Note (Signed)
Patient presented with dyspnea, orthopnea and PND.  BNP elevated in 600s.  Echocardiogram with EF of 45 to 50%, dilated atrium and severely elevated right sided pressure, pulmonary hypertension. Concern of pulmonary edema on imaging. -Continue with IV Lasix 20 mg twice daily -Strict intake and output -Daily weight and BMP

## 2022-08-27 NOTE — ED Notes (Addendum)
Pt went to 88% while on room air. Placed back on 1L [place]. Now at 94% O2

## 2022-08-27 NOTE — Progress Notes (Signed)
*  PRELIMINARY RESULTS* Echocardiogram 2D Echocardiogram has been performed.  Nichole Hess 08/27/2022, 12:01 PM

## 2022-08-27 NOTE — ED Notes (Signed)
Pt weened to room air at this time with O2 sat @ 93%. Will continue to monitor.

## 2022-08-27 NOTE — Progress Notes (Signed)
Progress Note   Patient: Nichole Hess LFY:[id number] DOB: May 13, 1933 DOA: 08/26/2022     0 DOS: the patient was seen and examined on 08/27/2022   Brief hospital course: Taken from H&P.  KATHELINE HIPKINS is a 89 y.o. female with medical history significant for hypertension, hypothyroidism presenting with complaints of shortness of breath off and on for the past few weeks.  Patient has chronic lower extremity edema for which she was using compression stockings.  She was also having orthopnea.  She was found to be mildly hypoxic in ED requiring up to 2 L of oxygen.  COVID PCR negative at PCP office.  BNP elevated at 06/06/2016 and normal troponin.  Chest x-ray with bilateral pleural effusion, worse on left and pulmonary vascular congestion. EKG showed A-fib with RVR and left bundle branch block.  Patient was given metoprolol and IV Lasix in ED.  8/21: Remained in A-fib with RVR, cardiology was consulted.  Echocardiogram with EF of 45 to 50% with severely increased right ventricular pressure and dilated atrium.  Cardiology is recommending rate control with metoprolol, continuing Eliquis, IV diuresis and holding other antihypertensives.  Assessment and Plan: * Atrial fibrillation with RVR (HCC) Patient with new onset A-fib with RVR, concern of dilated cardiomyopathy and elevated right heart pressures consistent with pulmonary hypertension.   CHADSVASC at least 5 (agex2, female, HTN, CHF)  Cardiology is on board. -Continue with metoprolol-titrate for better rate control and as blood pressure tolerated. -Continue with Eliquis  Acute HFrEF (heart failure with reduced ejection fraction) (HCC) Patient presented with dyspnea, orthopnea and PND.  BNP elevated in 600s.  Echocardiogram with EF of 45 to 50%, dilated atrium and severely elevated right sided pressure, pulmonary hypertension. Concern of pulmonary edema on imaging. -Continue with IV Lasix 20 mg twice daily -Strict intake and output -Daily  weight and BMP  SOB (shortness of breath) Secondary to new CHF in the setting of A-fib RVR patient presenting with pulmonary vascular congestions pleural effusion and has been having lower extremity edema and orthopnea symptoms. -Management as above  Acute respiratory failure with hypoxia (HCC) No baseline oxygen use, currently on 1 L of oxygen. Likely secondary to CHF exacerbation with pulmonary edema -Continue supplemental oxygen-wean as tolerated  Hypertension Home regimen consist of Coreg 12.5 twice daily, losartan 50, amlodipine 2.5. -Holding home losartan and amlodipine -Switch to Coreg with metoprolol -Continue to monitor  CKD (chronic kidney disease) stage 3, GFR 30-59 ml/min (HCC) History of CKD stage III AAA Currently patient's creatinine has improved to 1.02 EGFR 53. Renally dose medications and avoid contrast that was absolutely necessary.   Hypothyroidism Cont levothyroxine 75 mcg.    Prediabetes A1c today.    Hypertension, essential, benign      Subjective: Patient was seen and examined today.  No chest pain or shortness of breath while lying in bed  Physical Exam: Vitals:   08/27/22 1007 08/27/22 1030 08/27/22 1100 08/27/22 1200  BP:   128/84 (!) 92/59  Pulse: (!) 106 86 (!) 115 88  Resp: 18 15 (!) 23 16  Temp:      TempSrc:      SpO2: 93% 94% 91% 95%  Weight:      Height:       General.  Frail elderly lady, in no acute distress. Pulmonary.  Lungs clear bilaterally, normal respiratory effort. CV.  Irregularly irregular with tachycardia Abdomen.  Soft, nontender, nondistended, BS positive. CNS.  Alert and oriented .  No focal neurologic deficit. Extremities.  No edema, no cyanosis, pulses intact and symmetrical. Psychiatry.  Judgment and insight appears normal.   Data Reviewed: Prior data reviewed  Family Communication: Tried calling daughter with no response.  Disposition: Status is: Observation The patient will require care spanning > 2  midnights and should be moved to inpatient because: Severity of illness  Planned Discharge Destination: Home  DVT prophylaxis.  Eliquis Time spent: 50 minutes  This record has been created using Conservation officer, historic buildings. Errors have been sought and corrected,but may not always be located. Such creation errors do not reflect on the standard of care.   Author: Arnetha Courser, MD 08/27/2022 1:52 PM  For on call review [email or web address].

## 2022-08-27 NOTE — Consult Note (Signed)
Cardiology Consultation   Patient ID: Nichole Hess MRN: [id number]; DOB: [birth year]  Admit date: 08/26/2022 Date of Consult: 08/27/2022  PCP:  Luciana Axe, NP   [facility] Providers Cardiologist:  New  Patient Profile:   Nichole Hess is a 89 y.o. female with a hx of hypertension, aortic calcification, hypothyroidism, CKD stage 3 who is being seen 08/27/2022 for the evaluation of Afib RVR at the request of Dr. Nelson Chimes.  History of Present Illness:   Nichole Hess has not been seen by cardiology in the past. Patient was taking medication for high blood pressure and high cholesterol. Remote tobacco use. No alcohol or drug history.   The patient presented to the ER 08/27/22 for shortness of breath for the last 2 weeks. She reported chronic lower leg edema and she wears compression socks. She noted worsening orthopnea the last few days. No recent fever or chills.   In the ER she was noted to be in Afib RVR with HR 120s. RR 26, O2 94% on 2L O2. EKG showed Afb RVR with LAD and heart rate of 125 bpm. Labs showed Scr 1.02, BNP 618, normal troponin and CBC. CXR showed trace bilateral pleural effusions, main pulmonary artery vs pulmonary HTN. COVID negative. Patient was started on metoprolol and given lasix.    Past Medical History:  Diagnosis Date   Breast cancer (HCC) 06/05/1999   lumpectomy-positive   Hypertension    Personal history of radiation therapy    Thyroid disease     Past Surgical History:  Procedure Laterality Date   BREAST BIOPSY Left 06/05/99   lumpectomy/rad   BREAST LUMPECTOMY Left 2001   CHOLECYSTECTOMY       Home Medications:  Prior to Admission medications   Medication Sig Start Date End Date Taking? Authorizing Provider  amLODipine (NORVASC) 5 MG tablet Take 1 tablet by mouth 2 (two) times daily. 01/08/22  Yes [provider]  aspirin 81 MG chewable tablet Chew by mouth daily.   Yes [provider]  carvedilol (COREG) 6.25 MG  tablet Take 1 tablet by mouth 2 (two) times daily with a meal. 02/24/22  Yes [provider]  fluticasone (FLONASE) 50 MCG/ACT nasal spray Place 2 sprays into both nostrils daily. 07/30/22 07/30/23 Yes [provider]  gabapentin (NEURONTIN) 300 MG capsule Take 1 capsule by mouth at bedtime. 02/12/22  Yes [provider]  levothyroxine (SYNTHROID) 75 MCG tablet Take 75 mcg by mouth daily before breakfast.   Yes [provider]  losartan (COZAAR) 50 MG tablet Take 1 tablet by mouth daily. 08/31/18  Yes [provider]  Multiple Vitamin (MULTI-VITAMINS) TABS Take by mouth.   Yes [provider]  amLODipine (NORVASC) 2.5 MG tablet Take by mouth. Patient not taking: Reported on 08/26/2022 02/11/17   [provider]  carvedilol (COREG) 12.5 MG tablet Take by mouth. Patient not taking: Reported on 08/26/2022 02/11/17   [provider]  diazepam (VALIUM) 5 MG tablet Take 1 tablet (5 mg total) by mouth every 12 (twelve) hours as needed for anxiety. Patient not taking: Reported on 08/26/2022 10/16/18   Tommie Sams, DO  gabapentin (NEURONTIN) 300 MG capsule Take 1 capsule (300 mg total) by mouth 3 (three) times daily for 7 days. Patient not taking: Reported on 08/26/2022 06/02/17 08/26/22  Lurline Idol, FNP  levothyroxine (SYNTHROID, LEVOTHROID) 25 MCG tablet Take 75 mcg by mouth daily before breakfast.  Patient not taking: Reported on 08/26/2022  [provider]  meclizine (ANTIVERT) 25 MG tablet Take 1 tablet (25 mg total) by mouth 3 (three) times daily as needed for dizziness. Patient not taking: Reported on 08/26/2022 04/20/17   Tommie Sams, DO  pravastatin (PRAVACHOL) 40 MG tablet Take 40 mg by mouth at bedtime. 02/11/17   [provider]  traMADol (ULTRAM) 50 MG tablet Take 1 tablet (50 mg total) by mouth every 12 (twelve) hours as needed for moderate pain or severe pain. Do not take with muscle relaxant Patient not  taking: Reported on 08/26/2022 10/14/18   Renford Dills, NP    Inpatient Medications: Scheduled Meds:  apixaban  5 mg Oral BID   aspirin  81 mg Oral Daily   furosemide  20 mg Intravenous Q12H   gabapentin  300 mg Oral TID   levothyroxine  75 mcg Oral Q0600   metoprolol tartrate  12.5 mg Oral BID   pravastatin  40 mg Oral q1800   sodium chloride flush  3 mL Intravenous Q12H   Continuous Infusions:  PRN Meds: acetaminophen **OR** acetaminophen, diazepam, sodium chloride flush  Allergies:   No Known Allergies  Social History:   Social History   Socioeconomic History   Marital status: Divorced    Spouse name: Not on file   Number of children: Not on file   Years of education: Not on file   Highest education level: Not on file  Occupational History   Not on file  Tobacco Use   Smoking status: Former    Current packs/day: 0.00    Average packs/day: 1 pack/day for 10.0 years (10.0 ttl pk-yrs)    Types: Cigarettes    Start date: 10/14/1958    Quit date: 10/13/1968    Years since quitting: 53.9   Smokeless tobacco: Never  Vaping Use   Vaping status: Never Used  Substance and Sexual Activity   Alcohol use: No   Drug use: No   Sexual activity: Not on file  Other Topics Concern   Not on file  Social History Narrative   Lives with daughterHenreitta Leber - [phone] ( mobile ).   Social Determinants of Health   Financial Resource Strain: Low Risk  (07/30/2022)   Received from [facility]   Overall Financial Resource Strain (CARDIA)    Difficulty of Paying Living Expenses: Not hard at all  Food Insecurity: No Food Insecurity (07/30/2022)   Received from [facility]   Hunger Vital Sign    Worried About Running Out of Food in the Last Year: Never true    Ran Out of Food in the Last Year: Never true  Transportation Needs: No Transportation Needs (07/30/2022)   Received from [facility] - Transportation     In the past 12 months, has lack of transportation kept you from medical appointments or from getting medications?: No    Lack of Transportation (Non-Medical): No  Physical Activity: Not on file  Stress: Not on file  Social Connections: Not on file  Intimate Partner Violence: Not on file    Family History:    Family History  Problem Relation Age of Onset   Other Mother        sepsis   Hypertension Mother    Heart attack Father 34   Breast cancer Neg Hx      ROS:  Please see the history of present illness.   All other ROS reviewed and negative.  Physical Exam/Data:   Vitals:   08/27/22 0530 08/27/22 0554 08/27/22 0622 08/27/22 0700  BP:    110/75  Pulse: 81  61 91  Resp: 18  18 17   Temp:  97.7 F (36.5 C)    TempSrc:  Oral    SpO2: 93%  94% 93%  Weight:      Height:        Intake/Output Summary (Last 24 hours) at 08/27/2022 0925 Last data filed at 08/27/2022 0902 Gross per 24 hour  Intake --  Output 1150 ml  Net -1150 ml      08/26/2022    6:10 PM 10/16/2018   11:43 AM 10/14/2018   12:19 PM  Last 3 Weights  Weight (lbs) 174 lb 2.6 oz 174 lb 2.6 oz 176 lb  Weight (kg) 79 kg 79 kg 79.833 kg     Body mass index is 29.9 kg/m.  General:  Well nourished, well developed, in no acute distress HEENT: normal Neck: no JVD Vascular: No carotid bruits; Distal pulses 2+ bilaterally Cardiac:  normal S1, S2; Irreg Irreg; no murmur  Lungs:  clear to auscultation bilaterally, no wheezing, rhonchi or rales  Abd: soft, nontender, no hepatomegaly  Ext: no edema Musculoskeletal:  No deformities, BUE and BLE strength normal and equal Skin: warm and dry  Neuro:  CNs 2-12 intact, no focal abnormalities noted Psych:  Normal affect   EKG:  The EKG was personally reviewed and demonstrates:  Afib 112bpm, LBBB, nonspecific ST/T wave changes Telemetry:  Telemetry was personally reviewed and demonstrates:  Afib HR 100-120  Relevant CV Studies:  Echo ordered  Laboratory  Data:  High Sensitivity Troponin:   Recent Labs  Lab 08/26/22 1817 08/26/22 2012  TROPONINIHS 13 14     Chemistry Recent Labs  Lab 08/26/22 1817 08/27/22 0525  NA 137 141  K 4.0 3.5  CL 104 113*  CO2 21* 25  GLUCOSE 116* 86  BUN 21 22  CREATININE 1.02* 1.07*  CALCIUM 9.5 8.8*  GFRNONAA 53* 50*  ANIONGAP 12 3*    Recent Labs  Lab 08/27/22 0525  PROT 6.0*  ALBUMIN 3.2*  AST 20  ALT 18  ALKPHOS 58  BILITOT 0.6   Lipids No results for input(s): "CHOL", "TRIG", "HDL", "LABVLDL", "LDLCALC", "CHOLHDL" in the last 168 hours.  Hematology Recent Labs  Lab 08/26/22 1817 08/27/22 0525  WBC 8.7 7.2  RBC 4.18 3.71*  HGB 12.7 11.4*  HCT 38.2 34.9*  MCV 91.4 94.1  MCH 30.4 30.7  MCHC 33.2 32.7  RDW 14.6 14.6  PLT 274 238   Thyroid  Recent Labs  Lab 08/26/22 1833 08/26/22 2012  TSH  --  3.666  FREET4 1.27*  --     BNP Recent Labs  Lab 08/26/22 1817  BNP 618.6*    DDimer No results for input(s): "DDIMER" in the last 168 hours.   Radiology/Studies:  DG Chest 2 View  Result Date: 08/26/2022 CLINICAL DATA:  Cough and shortness of breath x 3 days. EXAM: CHEST - 2 VIEW COMPARISON:  None Available. FINDINGS: The heart and mediastinal contours are within normal limits. Prominent main pulmonary artery. Atherosclerotic plaque. Hyperinflation of the lungs with flattening of the bilateral hemidiaphragms. No focal consolidation. Chronic coarsened interstitial markings with no overt pulmonary edema. Bilateral at least trace pleural effusions. No pneumothorax. No acute osseous abnormality. IMPRESSION: 1. At least trace bilateral pleural effusions. 2. Prominent main pulmonary artery which may be due to positioning versus pulmonary hypertension. 3. Aortic  Atherosclerosis (ICD10-I70.0) and Emphysema (ICD10-J43.9). Electronically Signed   By: Tish Frederickson M.D.   On: 08/26/2022 17:31     Assessment and Plan:   New onset Afib RVR - patient presented with worsening SOB found  to have rapid afib given metoprolol with improvement - CHADSVASC at least 5 (agex2, female, HTN, CHF) - started on Eliquis 5mg  BID - Keep Mag>2 and K>4 - TSH wnl - started on lopressor 12.5mg  BID> increase for better rate control - may need to consider TEE/DCCV if patient doesn't self convert, although may be able to cardiovert as OP  Shortness of breath - in the setting of Afib RVR and acute heart failure - BNP 600s and CXR with pulmonary vascular congestions, pleural effusions - PTA LLE and orthopnea - IV lasix given - Echo ordered - monitor strict I/Os, daily weights and kidney function  Hypothyroidism - continue PTA levothyroxine  HTN - PTA coreg 12.5mg  BID, Losartan 50mg  daily, amlodipine 2.5mg  daily - started on Lopressor   CKD - Scr 1.07  For questions or updates, please contact [facility] Please consult [email or web address] for contact info under    Signed, Ahron Hulbert David Stall, PA-C  08/27/2022 9:25 AM

## 2022-08-27 NOTE — ED Notes (Signed)
Lab called for add on magnesium

## 2022-08-27 NOTE — Hospital Course (Addendum)
Taken from H&P.  Nichole Hess is a 89 y.o. female with medical history significant for hypertension, hypothyroidism presenting with complaints of shortness of breath off and on for the past few weeks.  Patient has chronic lower extremity edema for which she was using compression stockings.  She was also having orthopnea.  She was found to be mildly hypoxic in ED requiring up to 2 L of oxygen.  COVID PCR negative at PCP office.  BNP elevated at 06/06/2016 and normal troponin.  Chest x-ray with bilateral pleural effusion, worse on left and pulmonary vascular congestion. EKG showed A-fib with RVR and left bundle branch block.  Patient was given metoprolol and IV Lasix in ED.  8/21: Remained in A-fib with RVR, cardiology was consulted.  Echocardiogram with EF of 45 to 50% with severely increased right ventricular pressure and dilated atrium.  Cardiology is recommending rate control with metoprolol, continuing Eliquis, IV diuresis and holding other antihypertensives.  8/22: Remained in A-fib with intermittent mild RVR, rate seems improving.  Cardiology to titrate metoprolol as blood pressure permits.  Digoxin was also added.  Cardiology is more focused on rate control as it will be difficult to control A-fib due to significantly dilated atrium.  PT is recommending outpatient therapy.  8/23: Patient remained in A-fib with heart rate well-controlled.  Cardiology cleared her on discharge on metoprolol 25 mg twice daily, Lasix 20 mg and Eliquis.  Digoxin was stopped. her home aspirin, carvedilol, amlodipine and losartan was discontinued.  Patient was also found to be prediabetic for which she need carb modified diet and close follow-up with PCP for monitoring and further recommendation.  Patient will continue on current medications and need to have a close follow-up with her providers for further management.

## 2022-08-28 DIAGNOSIS — I340 Nonrheumatic mitral (valve) insufficiency: Secondary | ICD-10-CM | POA: Diagnosis not present

## 2022-08-28 DIAGNOSIS — J810 Acute pulmonary edema: Secondary | ICD-10-CM | POA: Diagnosis not present

## 2022-08-28 DIAGNOSIS — R0602 Shortness of breath: Secondary | ICD-10-CM | POA: Diagnosis not present

## 2022-08-28 DIAGNOSIS — I5021 Acute systolic (congestive) heart failure: Secondary | ICD-10-CM | POA: Diagnosis not present

## 2022-08-28 DIAGNOSIS — I10 Essential (primary) hypertension: Secondary | ICD-10-CM | POA: Diagnosis not present

## 2022-08-28 DIAGNOSIS — I4891 Unspecified atrial fibrillation: Secondary | ICD-10-CM | POA: Diagnosis not present

## 2022-08-28 LAB — BASIC METABOLIC PANEL
Anion gap: 8 (ref 5–15)
BUN: 22 mg/dL (ref 8–23)
CO2: 26 mmol/L (ref 22–32)
Calcium: 9.3 mg/dL (ref 8.9–10.3)
Chloride: 104 mmol/L (ref 98–111)
Creatinine, Ser: 1.05 mg/dL — ABNORMAL HIGH (ref 0.44–1.00)
GFR, Estimated: 51 mL/min — ABNORMAL LOW (ref >60)
Glucose, Bld: 94 mg/dL (ref 70–99)
Potassium: 3.6 mmol/L (ref 3.5–5.1)
Sodium: 138 mmol/L (ref 135–145)

## 2022-08-28 MED ORDER — POTASSIUM CHLORIDE CRYS ER 20 MEQ PO TBCR
20.0000 meq | EXTENDED_RELEASE_TABLET | Freq: Once | ORAL | Status: AC
  Administered 2022-08-28: 20 meq via ORAL
  Filled 2022-08-28: qty 1

## 2022-08-28 MED ORDER — DIGOXIN 0.25 MG/ML IJ SOLN
0.2500 mg | Freq: Once | INTRAMUSCULAR | Status: AC
  Administered 2022-08-28: 0.25 mg via INTRAVENOUS
  Filled 2022-08-28: qty 2

## 2022-08-28 MED ORDER — DIGOXIN 125 MCG PO TABS
0.1250 mg | ORAL_TABLET | Freq: Every day | ORAL | Status: DC
  Filled 2022-08-28: qty 1

## 2022-08-28 MED ORDER — POLYETHYLENE GLYCOL 3350 17 G PO PACK
17.0000 g | PACK | Freq: Every day | ORAL | Status: DC
  Administered 2022-08-28: 17 g via ORAL
  Filled 2022-08-28: qty 1

## 2022-08-28 MED ORDER — GABAPENTIN 300 MG PO CAPS: 300.0000 mg | ORAL_CAPSULE | Freq: Every day | ORAL | Status: DC

## 2022-08-28 NOTE — Progress Notes (Signed)
Progress Note   Patient: Nichole Hess ZOX:[id number] DOB: January 17, 1933 DOA: 08/26/2022     1 DOS: the patient was seen and examined on 08/28/2022   Brief hospital course: Taken from H&P.  MAKENLEY SCHWIETERMAN is a 89 y.o. female with medical history significant for hypertension, hypothyroidism presenting with complaints of shortness of breath off and on for the past few weeks.  Patient has chronic lower extremity edema for which she was using compression stockings.  She was also having orthopnea.  She was found to be mildly hypoxic in ED requiring up to 2 L of oxygen.  COVID PCR negative at PCP office.  BNP elevated at 06/06/2016 and normal troponin.  Chest x-ray with bilateral pleural effusion, worse on left and pulmonary vascular congestion. EKG showed A-fib with RVR and left bundle branch block.  Patient was given metoprolol and IV Lasix in ED.  8/21: Remained in A-fib with RVR, cardiology was consulted.  Echocardiogram with EF of 45 to 50% with severely increased right ventricular pressure and dilated atrium.  Cardiology is recommending rate control with metoprolol, continuing Eliquis, IV diuresis and holding other antihypertensives.  8/22: Remained in A-fib with intermittent mild RVR, rate seems improving.  Cardiology to titrate metoprolol as blood pressure permits.  Digoxin was also added.  Cardiology is more focused on rate control as it will be difficult to control A-fib due to significantly dilated atrium.  PT is recommending outpatient therapy.  Assessment and Plan: * Atrial fibrillation with RVR (HCC) Patient with new onset A-fib with RVR, concern of dilated cardiomyopathy and elevated right heart pressures consistent with pulmonary hypertension.   CHADSVASC at least 5 (agex2, female, HTN, CHF)  Cardiology is on board. -Continue with metoprolol-titrate for better rate control and as blood pressure tolerated. -Oxygen was added by cardiology -Continue with Eliquis  Acute HFrEF (heart  failure with reduced ejection fraction) (HCC) Patient presented with dyspnea, orthopnea and PND.  BNP elevated in 600s.  Echocardiogram with EF of 45 to 50%, dilated atrium and severely elevated right sided pressure, pulmonary hypertension. Concern of pulmonary edema on imaging. -Continue with IV Lasix 20 mg twice daily -Strict intake and output -Daily weight and BMP  SOB (shortness of breath) Secondary to new CHF in the setting of A-fib RVR patient presenting with pulmonary vascular congestions pleural effusion and has been having lower extremity edema and orthopnea symptoms. -Management as above  Acute respiratory failure with hypoxia (HCC) No baseline oxygen use, currently on 1 L of oxygen. Likely secondary to CHF exacerbation with pulmonary edema -Continue supplemental oxygen-wean as tolerated  Hypertension Home regimen consist of Coreg 12.5 twice daily, losartan 50, amlodipine 2.5. -Holding home losartan and amlodipine -Coreg was switched with metoprolol -Continue to monitor  CKD (chronic kidney disease) stage 3, GFR 30-59 ml/min (HCC) History of CKD stage III A, Creatinine currently stable Renally dose medications and avoid contrast that was absolutely necessary.   Hypothyroidism Cont levothyroxine 75 mcg.    Prediabetes A1c today.    Hypertension, essential, benign      Subjective: Patient with improved dyspnea today.  She was complaining of constipation, would like to get some prune juice.  Physical Exam: Vitals:   08/28/22 0409 08/28/22 0811 08/28/22 1241 08/28/22 1534  BP: 104/73 115/71 106/71 (!) 101/58  Pulse: 92 66 (!) 106 91  Resp: 18 16 16 16   Temp: 98.2 F (36.8 C) 97.6 F (36.4 C) 98 F (36.7 C) (!) 97.4 F (36.3 C)  TempSrc:   Oral  SpO2: 97% 94% 93% 94%  Weight:      Height:       General.  Frail elderly lady, in no acute distress. Pulmonary.  Lungs clear bilaterally, normal respiratory effort. CV.  Irregularly irregular. Abdomen.   Soft, nontender, nondistended, BS positive. CNS.  Alert and oriented .  No focal neurologic deficit. Extremities.  No edema, no cyanosis, pulses intact and symmetrical. Psychiatry.  Judgment and insight appears normal.    Data Reviewed: Prior data reviewed  Family Communication: Tried calling daughter again with no response.  Phone seems to be turned off  Disposition: Status is: Inpatient due to: Severity of illness  Planned Discharge Destination: Home  DVT prophylaxis.  Eliquis Time spent: 45 minutes  This record has been created using Conservation officer, historic buildings. Errors have been sought and corrected,but may not always be located. Such creation errors do not reflect on the standard of care.   Author: Arnetha Courser, MD 08/28/2022 3:53 PM  For on call review [email or web address].

## 2022-08-28 NOTE — Progress Notes (Signed)
Cardiology Progress Note   Patient Name: Nichole Hess Date of Encounter: 08/28/2022  Primary Cardiologist: Julien Nordmann, MD  Subjective   Breathing improving.  Remains in A-fib in the 80s to 90s at rest.  Denies palpitations or chest pain.  Inpatient Medications    Scheduled Meds:  apixaban  5 mg Oral BID   digoxin  0.25 mg Intravenous Once   Followed by   [START ON 08/29/2022] digoxin  0.125 mg Oral Daily   furosemide  20 mg Intravenous Q12H   gabapentin  300 mg Oral TID   levothyroxine  75 mcg Oral Q0600   metoprolol tartrate  25 mg Oral BID   potassium chloride  20 mEq Oral Once   pravastatin  40 mg Oral q1800   sodium chloride flush  3 mL Intravenous Q12H   Continuous Infusions:  PRN Meds: acetaminophen **OR** acetaminophen, diazepam, sodium chloride flush   Vital Signs    Vitals:   08/28/22 0033 08/28/22 0409 08/28/22 0811 08/28/22 1241  BP: 97/71 104/73 115/71 106/71  Pulse: 79 92 66 (!) 106  Resp: 16 18 16 16   Temp: 97.8 F (36.6 C) 98.2 F (36.8 C) 97.6 F (36.4 C) 98 F (36.7 C)  TempSrc:    Oral  SpO2: 92% 97% 94% 93%  Weight:      Height:        Intake/Output Summary (Last 24 hours) at 08/28/2022 1300 Last data filed at 08/28/2022 1014 Gross per 24 hour  Intake 400 ml  Output 1075 ml  Net -675 ml   Filed Weights   08/26/22 1810  Weight: 79 kg    Physical Exam   GEN: Well nourished, well developed, in no acute distress.  HEENT: Grossly normal.  Neck: Supple, no JVD, carotid bruits, or masses. Cardiac: RRR, no murmurs, rubs, or gallops. No clubbing, cyanosis, edema.  Radials 2+, DP/PT 2+ and equal bilaterally.  Respiratory:  Respirations regular and unlabored, clear to auscultation bilaterally. GI: Soft, nontender, nondistended, BS + x 4. MS: no deformity or atrophy. Skin: warm and dry, no rash. Neuro:  Strength and sensation are intact. Psych: AAOx3.  Normal affect.  Labs    Chemistry Recent Labs  Lab 08/26/22 1817  08/27/22 0525 08/28/22 0510  NA 137 141 138  K 4.0 3.5 3.6  CL 104 113* 104  CO2 21* 25 26  GLUCOSE 116* 86 94  BUN 21 22 22   CREATININE 1.02* 1.07* 1.05*  CALCIUM 9.5 8.8* 9.3  PROT  --  6.0*  --   ALBUMIN  --  3.2*  --   AST  --  20  --   ALT  --  18  --   ALKPHOS  --  58  --   BILITOT  --  0.6  --   GFRNONAA 53* 50* 51*  ANIONGAP 12 3* 8     Hematology Recent Labs  Lab 08/26/22 1817 08/27/22 0525  WBC 8.7 7.2  RBC 4.18 3.71*  HGB 12.7 11.4*  HCT 38.2 34.9*  MCV 91.4 94.1  MCH 30.4 30.7  MCHC 33.2 32.7  RDW 14.6 14.6  PLT 274 238    Cardiac Enzymes  Recent Labs  Lab 08/26/22 1817 08/26/22 2012  TROPONINIHS 13 14      BNP    Component Value Date/Time   BNP 618.6 (H) 08/26/2022 1817   HbA1c  Lab Results  Component Value Date   HGBA1C 5.6 08/27/2022   Lab Results  Component Value Date  TSH 3.666 08/26/2022     Radiology    DG Chest 2 View  Result Date: 08/26/2022 CLINICAL DATA:  Cough and shortness of breath x 3 days. EXAM: CHEST - 2 VIEW COMPARISON:  None Available. FINDINGS: The heart and mediastinal contours are within normal limits. Prominent main pulmonary artery. Atherosclerotic plaque. Hyperinflation of the lungs with flattening of the bilateral hemidiaphragms. No focal consolidation. Chronic coarsened interstitial markings with no overt pulmonary edema. Bilateral at least trace pleural effusions. No pneumothorax. No acute osseous abnormality. IMPRESSION: 1. At least trace bilateral pleural effusions. 2. Prominent main pulmonary artery which may be due to positioning versus pulmonary hypertension. 3. Aortic Atherosclerosis (ICD10-I70.0) and Emphysema (ICD10-J43.9). Electronically Signed   By: Tish Frederickson M.D.   On: 08/26/2022 17:31    Telemetry    Atrial fibrillation, 80s to 90s- Personally Reviewed  Cardiac Studies   2D Echocardiogram 8.21.2024  1. Left ventricular ejection fraction, by estimation, is 45 to 50%. The  left  ventricle has mildly decreased function. The left ventricle has no  regional wall motion abnormalities. There is moderate left ventricular  hypertrophy. Left ventricular  diastolic parameters are indeterminate.   2. Right ventricular systolic function is normal. The right ventricular  size is normal. There is moderately elevated pulmonary artery systolic  pressure. The estimated right ventricular systolic pressure is 52.6 mmHg.   3. Left atrial size was severely dilated.   4. The mitral valve is normal in structure. Moderate to severe mitral  valve regurgitation. No evidence of mitral stenosis.   5. Tricuspid valve regurgitation is moderate to severe.   6. The aortic valve has an indeterminant number of cusps. There is mild  calcification of the aortic valve. Aortic valve regurgitation is mild.  Aortic valve sclerosis is present, with no evidence of aortic valve  stenosis.   7. The inferior vena cava is normal in size with greater than 50%  respiratory variability, suggesting right atrial pressure of 3 mmHg.   Patient Profile     89 y.o. female w/ a h/o HTN, Ao atherosclerosis, hypothyroidism, and CKD III, who was admitted 8/20 w/ rapid afib and CHF.  EF 45-50%.  Assessment & Plan    1.  Afib w/ RVR: Patient admitted August 20 due to 3-week history of progressive dyspnea on exertion, PND, orthopnea, and restlessness.  She was found to be in rapid atrial fibrillation and has responded relatively well to low-dose beta-blocker therapy - metoprolol 25 mg twice daily, with rates in the 80s to 90s at rest.  We are concerned that with activity, rates are likely to be higher.  Unfortunately, blood pressure is relatively soft likely preventing further titration beta-blocker therapy.  Will add low-dose digoxin today and recognize that she will require close monitoring of digoxin level within the next few days in the setting of advanced age.  Continue Eliquis 5 mg twice daily (CHA2DS2-VASc equals  5).  2.  Acute HFmrEF:  In setting of above, volume overloaded.  EF 45-50% by echo w/ RVSP 52.34mmHg, mod-sev MR/TR. I's and O's inaccurate, but she does have good urine output recorded over the past 24 hours.  Breathing has improved some.  Renal function stable.  Continue intravenous diuresis at current dose.  Continue current dose of beta-blocker with an eye toward consolidating to Toprol-XL prior to discharge.  Currently, no blood pressure for acei/arb/arni/mra.  3.  Essential hypertension: Blood pressure relatively soft on oral beta-blocker therapy.  Will follow-up.  4.  Hypothyroidism: Normal TSH  this admission.  Continue levothyroxine.  5.  Stage III chronic kidney disease: Creatinine stable.  Follow with diuresis.  6.  Moderate to severe mitral regurgitation/tricuspid regurgitation: Conservative therapy in the setting of advanced age.  Diuresis as outlined above.  Signed, Nicolasa Ducking, NP  08/28/2022, 1:00 PM    For questions or updates, please contact   Please consult [email or web address] for contact info under Cardiology/STEMI.

## 2022-08-28 NOTE — Assessment & Plan Note (Signed)
History of CKD stage III A, Creatinine currently stable Renally dose medications and avoid contrast that was absolutely necessary.

## 2022-08-28 NOTE — Assessment & Plan Note (Signed)
Patient presented with dyspnea, orthopnea and PND.  BNP elevated in 600s.  Echocardiogram with EF of 45 to 50%, dilated atrium and severely elevated right sided pressure, pulmonary hypertension. Concern of pulmonary edema on imaging. -Continue with IV Lasix 20 mg twice daily -Strict intake and output -Daily weight and BMP

## 2022-08-28 NOTE — Hospital Course (Signed)
BNP 618.6  I/O inaccurate -

## 2022-08-28 NOTE — Progress Notes (Signed)
Transition of Care [facility]) - Inpatient Brief Assessment   Patient Details  Name: Nichole Hess MRN: [id number] Date of Birth: 10/23/1933  [facility]) CM/SW Contact:    Truddie Hidden, RN Phone Number: 08/28/2022, 10:02 AM   Clinical Narrative:    Transition of Care Asessment: Insurance and Status: Insurance coverage has been reviewed Patient has primary care physician: Yes Home environment has been reviewed: Return to home   Prior/Current Home Services: No current home services Social Determinants of Health Reivew: SDOH reviewed no interventions necessary Readmission risk has been reviewed: Yes Transition of care needs: no transition of care needs at this time

## 2022-08-28 NOTE — Care Management Important Message (Signed)
Important Message  Patient Details  Name: Nichole Hess MRN: [id number] Date of Birth: 1933/10/08   Medicare Important Message Given:  N/A - LOS <3 / Initial given by admissions     Johnell Comings 08/28/2022, 5:46 PM

## 2022-08-28 NOTE — Progress Notes (Signed)
Chap visited the Pt at bedside during a routine rounding. Pt expressed faith in Divine and she is grateful for her daughter and grandson who are her world. Chap provided reflective listening and calming presence.   08/28/22 1500  Spiritual Encounters  Type of Visit Initial  Care provided to: Patient  Referral source Chaplain assessment  Reason for visit Routine spiritual support  OnCall Visit Yes  Spiritual Framework  Presenting Themes Values and beliefs;Meaning/purpose/sources of inspiration  Community/Connection Family  Patient Stress Factors None identified  Interventions  Spiritual Care Interventions Made Established relationship of care and support;Mindfulness intervention  Intervention Outcomes  Outcomes Reduced isolation;Awareness of support;Connection to spiritual care  Spiritual Care Plan  Spiritual Care Issues Still Outstanding No further spiritual care needs at this time (see row info)

## 2022-08-28 NOTE — Assessment & Plan Note (Signed)
Home regimen consist of Coreg 12.5 twice daily, losartan 50, amlodipine 2.5. -Holding home losartan and amlodipine -Coreg was switched with metoprolol -Continue to monitor

## 2022-08-28 NOTE — Evaluation (Signed)
Physical Therapy Evaluation Patient Details Name: Nichole Hess MRN: [id number] DOB: 20-Jan-1933 Today's Date: 08/28/2022  History of Present Illness  Pt is an 89 yo female that presented for progressive dyspnea on exertion, orthopnea, and restlessness.  Workup found to be in rapid atrial fibrillation acute HF. PMH of HTN, hypothyroidism,chronic BLE edema, CKD.   Clinical Impression  Patient A&Ox4, denied pain. Reported at baseline she is modI/I for ADLs, uses a SPC as needed due to R hip pain, and daughter assists with IADLs if needed (pt able to tend to her tomato plants up until SOB episode).  She was sitting EOB, good balance noted. Sit <> stand with RW and CGA and able to ambulate ~259ft total. No LOB noted but pt did endorse increased fatigue and weakness compared to baseline. spO2 in 90s on room air throughout, HR max 123, but questionable reading (RN notified). Pt denied complaints of SOB but exhibited some fatigue after walking.  Overall the patient demonstrated deficits (see "PT Problem List") that impede the patient's functional abilities, safety, and mobility and would benefit from skilled PT intervention.         If plan is discharge home, recommend the following: Assist for transportation;Help with stairs or ramp for entrance   Can travel by private vehicle        Equipment Recommendations Rolling walker (2 wheels)  Recommendations for Other Services       Functional Status Assessment Patient has had a recent decline in their functional status and demonstrates the ability to make significant improvements in function in a reasonable and predictable amount of time.     Precautions / Restrictions Precautions Precautions: Fall Precaution Comments: watch HR/spO2 Restrictions Weight Bearing Restrictions: No      Mobility  Bed Mobility               General bed mobility comments: pt sitting EOB upon PT entrance    Transfers Overall transfer level: Needs  assistance Equipment used: Rolling walker (2 wheels) Transfers: Sit to/from Stand Sit to Stand: Supervision                Ambulation/Gait Ambulation/Gait assistance: Contact guard assist, Supervision Gait Distance (Feet): 210 Feet Assistive device: Rolling walker (2 wheels)         General Gait Details: overall steady, though still weaker than baseline per her report. progressed to supervision  Stairs            Wheelchair Mobility     Tilt Bed    Modified Rankin (Stroke Patients Only)       Balance Overall balance assessment: Needs assistance Sitting-balance support: Feet supported Sitting balance-Leahy Scale: Good       Standing balance-Leahy Scale: Fair                               Pertinent Vitals/Pain Pain Assessment Pain Assessment: No/denies pain    Home Living Family/patient expects to be discharged to:: Private residence Living Arrangements: Children Available Help at Discharge: Family Type of Home: House Home Access: Stairs to enter Entrance Stairs-Rails: Left Entrance Stairs-Number of Steps: 3-4   Home Layout: Two level;Able to live on main level with bedroom/bathroom Home Equipment: Rollator (4 wheels);Cane - single point      Prior Function Prior Level of Function : Independent/Modified Independent             Mobility Comments: modI/I for mobility- including watering her tomato  plants. intermittently uses SPC depending on how her hip is feeling ADLs Comments: I, daughter does assist with driving/cooking/cleaning     Extremity/Trunk Assessment   Upper Extremity Assessment Upper Extremity Assessment: Overall WFL for tasks assessed    Lower Extremity Assessment Lower Extremity Assessment:  (able to move BLE against gravity)       Communication   Communication Communication: No apparent difficulties  Cognition Arousal: Alert Behavior During Therapy: WFL for tasks assessed/performed Overall Cognitive  Status: Within Functional Limits for tasks assessed                                          General Comments      Exercises     Assessment/Plan    PT Assessment Patient needs continued PT services  PT Problem List Decreased activity tolerance;Decreased balance;Decreased mobility       PT Treatment Interventions DME instruction;Neuromuscular re-education;Gait training;Stair training;Patient/family education;Functional mobility training;Therapeutic activities;Therapeutic exercise;Balance training    PT Goals (Current goals can be found in the Care Plan section)  Acute Rehab PT Goals Patient Stated Goal: to go home PT Goal Formulation: With patient Time For Goal Achievement: 09/11/22 Potential to Achieve Goals: Good    Frequency Min 1X/week     Co-evaluation               AM-PAC PT "6 Clicks" Mobility  Outcome Measure Help needed turning from your back to your side while in a flat bed without using bedrails?: None Help needed moving from lying on your back to sitting on the side of a flat bed without using bedrails?: None Help needed moving to and from a bed to a chair (including a wheelchair)?: None Help needed standing up from a chair using your arms (e.g., wheelchair or bedside chair)?: None Help needed to walk in hospital room?: None Help needed climbing 3-5 steps with a railing? : A Little 6 Click Score: 23    End of Session Equipment Utilized During Treatment: Gait belt Activity Tolerance: Patient tolerated treatment well Patient left: in chair;with call bell/phone within reach Nurse Communication: Mobility status;Other (comment) (HR max 123 (questionable reading) spO2 in 90s on room air) PT Visit Diagnosis: Other abnormalities of gait and mobility (R26.89)    Time: 1478-2956 PT Time Calculation (min) (ACUTE ONLY): 19 min   Charges:   PT Evaluation $PT Eval Low Complexity: 1 Low PT Treatments $Therapeutic Activity: 8-22 mins PT  General Charges $$ ACUTE PT VISIT: 1 Visit        Olga Coaster PT, DPT 2:52 PM,08/28/22

## 2022-08-28 NOTE — Assessment & Plan Note (Signed)
Patient with new onset A-fib with RVR, concern of dilated cardiomyopathy and elevated right heart pressures consistent with pulmonary hypertension.   CHADSVASC at least 5 (agex2, female, HTN, CHF)  Cardiology is on board. -Continue with metoprolol-titrate for better rate control and as blood pressure tolerated. -Oxygen was added by cardiology -Continue with Eliquis

## 2022-08-29 ENCOUNTER — Other Ambulatory Visit (HOSPITAL_COMMUNITY): Payer: Self-pay

## 2022-08-29 DIAGNOSIS — I5021 Acute systolic (congestive) heart failure: Secondary | ICD-10-CM | POA: Diagnosis not present

## 2022-08-29 DIAGNOSIS — I10 Essential (primary) hypertension: Secondary | ICD-10-CM | POA: Diagnosis not present

## 2022-08-29 DIAGNOSIS — I4891 Unspecified atrial fibrillation: Secondary | ICD-10-CM | POA: Diagnosis not present

## 2022-08-29 DIAGNOSIS — R7303 Prediabetes: Secondary | ICD-10-CM

## 2022-08-29 DIAGNOSIS — J9601 Acute respiratory failure with hypoxia: Secondary | ICD-10-CM | POA: Diagnosis not present

## 2022-08-29 LAB — BASIC METABOLIC PANEL
Anion gap: 14 (ref 5–15)
BUN: 27 mg/dL — ABNORMAL HIGH (ref 8–23)
CO2: 25 mmol/L (ref 22–32)
Calcium: 9.5 mg/dL (ref 8.9–10.3)
Chloride: 100 mmol/L (ref 98–111)
Creatinine, Ser: 1.08 mg/dL — ABNORMAL HIGH (ref 0.44–1.00)
GFR, Estimated: 50 mL/min — ABNORMAL LOW (ref >60)
Glucose, Bld: 87 mg/dL (ref 70–99)
Potassium: 4 mmol/L (ref 3.5–5.1)
Sodium: 139 mmol/L (ref 135–145)

## 2022-08-29 MED ORDER — APIXABAN 5 MG PO TABS: 5.0000 mg | ORAL_TABLET | Freq: Two times a day (BID) | ORAL | 2 refills | Status: DC

## 2022-08-29 MED ORDER — FUROSEMIDE 20 MG PO TABS: 20.0000 mg | ORAL_TABLET | Freq: Every day | ORAL | 1 refills | Status: DC

## 2022-08-29 MED ORDER — FUROSEMIDE 20 MG PO TABS
20.0000 mg | ORAL_TABLET | Freq: Every day | ORAL | Status: DC
  Administered 2022-08-29: 20 mg via ORAL
  Filled 2022-08-29: qty 1

## 2022-08-29 MED ORDER — METOPROLOL TARTRATE 25 MG PO TABS: 25.0000 mg | ORAL_TABLET | Freq: Two times a day (BID) | ORAL | 1 refills | Status: DC

## 2022-08-29 NOTE — Progress Notes (Signed)
Mobility Specialist - Progress Note   08/29/22 1100  Mobility  Activity Ambulated independently in hallway  Level of Assistance Modified independent, requires aide device or extra time  Assistive Device Front wheel walker  Distance Ambulated (ft) 210 ft  Activity Response Tolerated well  $Mobility charge 1 Mobility     Pt sitting in recliner upon arrival, utilizing RA. Pt agreeable to activity. Able to don slip-in shoes and ambulate in hallway modI. Denied dizziness and SOB. AF HR ranging 109-123 bpm during activity. Pt returned to room with needs in reach.    Filiberto Pinks Mobility Specialist 08/29/22, 11:26 AM

## 2022-08-29 NOTE — Plan of Care (Signed)

## 2022-08-29 NOTE — TOC Transition Note (Signed)
Transition of Care [facility]) - CM/SW Discharge Note   Patient Details  Name: Nichole Hess MRN: [id number] Date of Birth: [birth year]  [facility] CM/SW Contact:  Truddie Hidden, RN Phone Number: 08/29/2022, 2:47 PM   Clinical Narrative:    Spoke with patient regarding discharge plan and therapy recommendation. Patient declined to receive out patient therapy.          Patient Goals and CMS Choice      Discharge Placement                         Discharge Plan and Services Additional resources added to the After Visit Summary for                                       Social Determinants of Health (SDOH) Interventions SDOH Screenings   Food Insecurity: No Food Insecurity (08/27/2022)  Housing: Low Risk  (08/27/2022)  Transportation Needs: No Transportation Needs (08/27/2022)  Utilities: Not At Risk (08/27/2022)  Financial Resource Strain: Low Risk  (07/30/2022)   Received from [facility]  Tobacco Use: Medium Risk (08/27/2022)     Readmission Risk Interventions     No data to display

## 2022-08-29 NOTE — Progress Notes (Signed)
Cardiology Progress Note   Patient Name: APRYLL HALIM Date of Encounter: 08/29/2022  Primary Cardiologist: Julien Nordmann, MD  Subjective   Feels well this AM.  Just returned from bathroom w/o dyspnea.  Ambulated last night w/o significant dyspnea or tachycardia (rates to 110).  Inpatient Medications    Scheduled Meds:  apixaban  5 mg Oral BID   gabapentin  300 mg Oral QHS   levothyroxine  75 mcg Oral Q0600   metoprolol tartrate  25 mg Oral BID   polyethylene glycol  17 g Oral Daily   pravastatin  40 mg Oral q1800   sodium chloride flush  3 mL Intravenous Q12H   Continuous Infusions:  PRN Meds: acetaminophen **OR** acetaminophen, diazepam, sodium chloride flush   Vital Signs    Vitals:   08/28/22 2316 08/29/22 0356 08/29/22 0500 08/29/22 0846  BP: 97/63 104/76  119/65  Pulse: 80 68  75  Resp: 18 18    Temp: 97.9 F (36.6 C) 97.8 F (36.6 C)    TempSrc: Oral Oral    SpO2: 94% 94%    Weight:   67.1 kg   Height:        Intake/Output Summary (Last 24 hours) at 08/29/2022 0919 Last data filed at 08/28/2022 1014 Gross per 24 hour  Intake 400 ml  Output 475 ml  Net -75 ml   Filed Weights   08/26/22 1810 08/29/22 0500  Weight: 79 kg 67.1 kg    Physical Exam   GEN: Well nourished, well developed, in no acute distress.  HEENT: Grossly normal.  Neck: Supple, no JVD, carotid bruits, or masses. Cardiac: IR, IR, 1/6 syst murmur @ LLSB/apex, no rubs, or gallops. No clubbing, cyanosis, edema.  Radials 2+, DP/PT 2+ and equal bilaterally.  Respiratory:  Respirations regular and unlabored, clear to auscultation bilaterally. GI: Soft, nontender, nondistended, BS + x 4. MS: no deformity or atrophy. Skin: warm and dry, no rash. Neuro:  Strength and sensation are intact. Psych: AAOx3.  Normal affect.  Labs    Chemistry Recent Labs  Lab 08/27/22 0525 08/28/22 0510 08/29/22 0454  NA 141 138 139  K 3.5 3.6 4.0  CL 113* 104 100  CO2 25 26 25   GLUCOSE 86 94 87   BUN 22 22 27*  CREATININE 1.07* 1.05* 1.08*  CALCIUM 8.8* 9.3 9.5  PROT 6.0*  --   --   ALBUMIN 3.2*  --   --   AST 20  --   --   ALT 18  --   --   ALKPHOS 58  --   --   BILITOT 0.6  --   --   GFRNONAA 50* 51* 50*  ANIONGAP 3* 8 14     Hematology Recent Labs  Lab 08/26/22 1817 08/27/22 0525  WBC 8.7 7.2  RBC 4.18 3.71*  HGB 12.7 11.4*  HCT 38.2 34.9*  MCV 91.4 94.1  MCH 30.4 30.7  MCHC 33.2 32.7  RDW 14.6 14.6  PLT 274 238    Cardiac Enzymes  Recent Labs  Lab 08/26/22 1817 08/26/22 2012  TROPONINIHS 13 14      BNP    Component Value Date/Time   BNP 618.6 (H) 08/26/2022 1817   HbA1c  Lab Results  Component Value Date   HGBA1C 5.6 08/27/2022    Radiology    DG Chest 2 View  Result Date: 08/26/2022 CLINICAL DATA:  Cough and shortness of breath x 3 days. EXAM: CHEST - 2 VIEW COMPARISON:  None Available. FINDINGS: The heart and mediastinal contours are within normal limits. Prominent main pulmonary artery. Atherosclerotic plaque. Hyperinflation of the lungs with flattening of the bilateral hemidiaphragms. No focal consolidation. Chronic coarsened interstitial markings with no overt pulmonary edema. Bilateral at least trace pleural effusions. No pneumothorax. No acute osseous abnormality. IMPRESSION: 1. At least trace bilateral pleural effusions. 2. Prominent main pulmonary artery which may be due to positioning versus pulmonary hypertension. 3. Aortic Atherosclerosis (ICD10-I70.0) and Emphysema (ICD10-J43.9). Electronically Signed   By: Tish Frederickson M.D.   On: 08/26/2022 17:31    Telemetry    Afib, 70's to 80's - Personally Reviewed  Cardiac Studies   2D Echocardiogram 8.21.2024   1. Left ventricular ejection fraction, by estimation, is 45 to 50%. The  left ventricle has mildly decreased function. The left ventricle has no  regional wall motion abnormalities. There is moderate left ventricular  hypertrophy. Left ventricular  diastolic parameters are  indeterminate.   2. Right ventricular systolic function is normal. The right ventricular  size is normal. There is moderately elevated pulmonary artery systolic  pressure. The estimated right ventricular systolic pressure is 52.6 mmHg.   3. Left atrial size was severely dilated.   4. The mitral valve is normal in structure. Moderate to severe mitral  valve regurgitation. No evidence of mitral stenosis.   5. Tricuspid valve regurgitation is moderate to severe.   6. The aortic valve has an indeterminant number of cusps. There is mild  calcification of the aortic valve. Aortic valve regurgitation is mild.  Aortic valve sclerosis is present, with no evidence of aortic valve  stenosis.   7. The inferior vena cava is normal in size with greater than 50%  respiratory variability, suggesting right atrial pressure of 3 mmHg.   Patient Profile     89 y.o. female w/ a h/o HTN, Ao atherosclerosis, hypothyroidism, and CKD III, who was admitted 8/20 w/ rapid afib and CHF.  EF 45-50%.   Assessment & Plan    1.  Afib w/ RVR:  Patient admitted August 20 due to 3-week history of progressive dyspnea on exertion, PND, orthopnea, and restlessness. She was found to be in rapid atrial fibrillation and was placed on oral ? blocker therapy.  Soft blood pressures have limited further titration of metoprolol therapy (currently tolerating 25mg  BID).  She was given a dose of IV digoxin on 8/22 and is due for PO this AM.  CrCl 34.6 ml/min, making her a poor candidate for ongoing digoxin therapy - d/c.  Rates currently 70's to 80's.  She did ambulate yesterday and felt well w/ HRs to ~ 110.  Just ambulated to bathroom and felt well w/ stable rates.  Re-ambulate after bfast and AM meds.  If rates stable may be ready for d/c w/ outpt f/u.  With sev dilated LA w/ mod-sev MR on echo - unlikely to maintain sinus rhythm long term.  Cont eliquis 5 BID in setting of CHA2DS@VASc  = 5.  2.  Acute HFmrEF:  In setting of above, volume  overloaded.  EF 45-50% by echo w/ RVSP 52.2mmHg, mod-sev MR/TR.  I/Os inaccurate, however, wt 67.1 kg this AM, which is down from 69 kg @ outpt PCP visit on 7/24.  Euvolemic on exam.  She's already received metoprolol tartrate 25 mg this AM.  Ideally would like to consolidate to Toprol XL 50mg  daily @ discharge.  Transition lasix to 20 PO daily.  Due to relative hypotension, continue to avoid acei/arb/arni/mra.  With advanced  age, would not add sglt2i.  Consider outpt ischemic eval in setting of LV dysfxn, though notable that hsTrops are normal.  3.  Essential HTN:  BP 90's to low 100's on oral ? blocker.  Home doses of ARB/amlodipine on hold.  4.  Hypothyroidism:  Normal TSH this admission.  Cont levothyroxine.  5.  CKD III:  Creat relatively stable w/ diuresis.  6.  Mod-Sev MR/TR:  In setting of volume overload, rapid Afib.  Minimal murmur on exam.  Will likely need ongoing oral diuretic rx.  F/u as outpt.  Signed, Nicolasa Ducking, NP  08/29/2022, 9:19 AM    For questions or updates, please contact   Please consult [email or web address] for contact info under Cardiology/STEMI.

## 2022-08-29 NOTE — Discharge Summary (Addendum)
Physician Discharge Summary   Patient: Nichole Hess MRN: [id number] DOB: 08-26-1933  Admit date:     08/26/2022  Discharge date: 08/29/22  Discharge Physician: Arnetha Courser   PCP: Luciana Axe, NP   Recommendations at discharge:  Please obtain CBC and BMP in 1 week Follow-up with primary care provider Follow-up with cardiology within a week  Discharge Diagnoses: Principal Problem:   Atrial fibrillation with RVR (HCC) Active Problems:   Acute HFrEF (heart failure with reduced ejection fraction) (HCC)   SOB (shortness of breath)   Hypertension   Acute respiratory failure with hypoxia (HCC)   CKD (chronic kidney disease) stage 3, GFR 30-59 ml/min (HCC)   Hypothyroidism   Prediabetes   Acute pulmonary edema (HCC)   Dilated cardiomyopathy (HCC)   Nonrheumatic mitral valve regurgitation   Nonrheumatic tricuspid valve regurgitation   Hospital Course: Taken from H&P.  Nichole Hess is a 89 y.o. female with medical history significant for hypertension, hypothyroidism presenting with complaints of shortness of breath off and on for the past few weeks.  Patient has chronic lower extremity edema for which she was using compression stockings.  She was also having orthopnea.  She was found to be mildly hypoxic in ED requiring up to 2 L of oxygen.  COVID PCR negative at PCP office.  BNP elevated at 06/06/2016 and normal troponin.  Chest x-ray with bilateral pleural effusion, worse on left and pulmonary vascular congestion. EKG showed A-fib with RVR and left bundle branch block.  Patient was given metoprolol and IV Lasix in ED.  8/21: Remained in A-fib with RVR, cardiology was consulted.  Echocardiogram with EF of 45 to 50% with severely increased right ventricular pressure and dilated atrium.  Cardiology is recommending rate control with metoprolol, continuing Eliquis, IV diuresis and holding other antihypertensives.  8/22: Remained in A-fib with intermittent mild RVR, rate seems  improving.  Cardiology to titrate metoprolol as blood pressure permits.  Digoxin was also added.  Cardiology is more focused on rate control as it will be difficult to control A-fib due to significantly dilated atrium.  PT is recommending outpatient therapy.  8/23: Patient remained in A-fib with heart rate well-controlled.  Cardiology cleared her on discharge on metoprolol 25 mg twice daily, Lasix 20 mg and Eliquis.  Digoxin was stopped. her home aspirin, carvedilol, amlodipine and losartan was discontinued.  Patient was also found to be prediabetic for which she need carb modified diet and close follow-up with PCP for monitoring and further recommendation.  Patient will continue on current medications and need to have a close follow-up with her providers for further management.  Assessment and Plan: * Atrial fibrillation with RVR (HCC) Patient with new onset A-fib with RVR, concern of dilated cardiomyopathy and elevated right heart pressures consistent with pulmonary hypertension.   CHADSVASC at least 5 (agex2, female, HTN, CHF)  Cardiology is on board. -Continue with metoprolol-titrate for better rate control and as blood pressure tolerated. -Oxygen was added by cardiology -Continue with Eliquis  Acute HFrEF (heart failure with reduced ejection fraction) (HCC) Patient presented with dyspnea, orthopnea and PND.  BNP elevated in 600s.  Echocardiogram with EF of 45 to 50%, dilated atrium and severely elevated right sided pressure, pulmonary hypertension. Concern of pulmonary edema on imaging. -Continue with IV Lasix 20 mg twice daily -Strict intake and output -Daily weight and BMP  SOB (shortness of breath) Secondary to new CHF in the setting of A-fib RVR patient presenting with pulmonary vascular congestions pleural  effusion and has been having lower extremity edema and orthopnea symptoms. -Management as above  Acute respiratory failure with hypoxia (HCC) No baseline oxygen use, currently  on 1 L of oxygen. Likely secondary to CHF exacerbation with pulmonary edema -Continue supplemental oxygen-wean as tolerated  Hypertension Home regimen consist of Coreg 12.5 twice daily, losartan 50, amlodipine 2.5. -Holding home losartan and amlodipine -Coreg was switched with metoprolol -Continue to monitor  CKD (chronic kidney disease) stage 3, GFR 30-59 ml/min (HCC) History of CKD stage III A, Creatinine currently stable Renally dose medications and avoid contrast that was absolutely necessary.   Hypothyroidism Cont levothyroxine 75 mcg.    Prediabetes A1c today.    Hypertension, essential, benign     Consultants: Cardiology Procedures performed: None Disposition: Home Diet recommendation:  Discharge Diet Orders (From admission, onward)     Start     Ordered   08/29/22 0000  Diet - low sodium heart healthy        08/29/22 1230           Cardiac and Carb modified diet DISCHARGE MEDICATION: Allergies as of 08/29/2022   No Known Allergies      Medication List     STOP taking these medications    amLODipine 5 MG tablet Commonly known as: NORVASC   aspirin 81 MG chewable tablet   carvedilol 6.25 MG tablet Commonly known as: COREG   diazepam 5 MG tablet Commonly known as: VALIUM   losartan 50 MG tablet Commonly known as: COZAAR       TAKE these medications    apixaban 5 MG Tabs tablet Commonly known as: ELIQUIS Take 1 tablet (5 mg total) by mouth 2 (two) times daily.   fluticasone 50 MCG/ACT nasal spray Commonly known as: FLONASE Place 2 sprays into both nostrils daily.   furosemide 20 MG tablet Commonly known as: LASIX Take 1 tablet (20 mg total) by mouth daily. Start taking on: August 30, 2022   gabapentin 300 MG capsule Commonly known as: NEURONTIN Take 1 capsule by mouth at bedtime.   levothyroxine 75 MCG tablet Commonly known as: SYNTHROID Take 75 mcg by mouth daily before breakfast. What changed: Another medication with  the same name was removed. Continue taking this medication, and follow the directions you see here.   metoprolol tartrate 25 MG tablet Commonly known as: LOPRESSOR Take 1 tablet (25 mg total) by mouth 2 (two) times daily.   Multi-Vitamins Tabs Take by mouth.   pravastatin 40 MG tablet Commonly known as: PRAVACHOL Take 40 mg by mouth at bedtime.        Follow-up Information     Gollan, Tollie Pizza, MD. Go in 1 week(s).   Specialty: Cardiology Why: Appointment on Friday, 09/12/2022 at 11:00am. Contact information: [address] [place] [address] [phone]         Luciana Axe, NP. Schedule an appointment as soon as possible for a visit in 1 week(s).   Specialty: Family Medicine Contact information: [address] [place] [address] [phone]                Discharge Exam: Ceasar Mons Weights   08/26/22 1810 08/29/22 0500  Weight: 79 kg 67.1 kg   General.  Frail elderly lady, in no acute distress. Pulmonary.  Lungs clear bilaterally, normal respiratory effort. CV.  Irregularly irregular, no JVD, rub or murmur. Abdomen.  Soft, nontender, nondistended, BS positive. CNS.  Alert and oriented .  No focal neurologic  deficit. Extremities.  No edema, no cyanosis, pulses intact and symmetrical. Psychiatry.  Judgment and insight appears normal.   Condition at discharge: stable  The results of significant diagnostics from this hospitalization (including imaging, microbiology, ancillary and laboratory) are listed below for reference.   Imaging Studies: ECHOCARDIOGRAM COMPLETE BUBBLE STUDY  Result Date: 08/27/2022    ECHOCARDIOGRAM REPORT   Patient Name:   Nichole Hess Date of Exam: 08/27/2022 Medical Rec #:  [id number]      Height:       64.0 in Accession #:    [id number]     Weight:       174.2 lb Date of Birth:  [birth year]      BSA:          1.845 m Patient Age:    89 years       BP:           117/91 mmHg Patient Gender: F              HR:            107 bpm. Exam Location:  [facility] Procedure: 2D Echo, Cardiac Doppler, Color Doppler and Saline Contrast Bubble            Study Indications:     Atrial Fibrillation  History:         Patient has no prior history of Echocardiogram examinations.                  Arrythmias:Atrial Fibrillation; Signs/Symptoms:Shortness of                  Breath. CKD.  Sonographer:     Mikki Harbor Referring Phys:  [id number] Eliezer Mccoy PATEL Diagnosing Phys: Julien Nordmann MD IMPRESSIONS  1. Left ventricular ejection fraction, by estimation, is 45 to 50%. The left ventricle has mildly decreased function. The left ventricle has no regional wall motion abnormalities. There is moderate left ventricular hypertrophy. Left ventricular diastolic parameters are indeterminate.  2. Right ventricular systolic function is normal. The right ventricular size is normal. There is moderately elevated pulmonary artery systolic pressure. The estimated right ventricular systolic pressure is 52.6 mmHg.  3. Left atrial size was severely dilated.  4. The mitral valve is normal in structure. Moderate to severe mitral valve regurgitation. No evidence of mitral stenosis.  5. Tricuspid valve regurgitation is moderate to severe.  6. The aortic valve has an indeterminant number of cusps. There is mild calcification of the aortic valve. Aortic valve regurgitation is mild. Aortic valve sclerosis is present, with no evidence of aortic valve stenosis.  7. The inferior vena cava is normal in size with greater than 50% respiratory variability, suggesting right atrial pressure of 3 mmHg. FINDINGS  Left Ventricle: Left ventricular ejection fraction, by estimation, is 45 to 50%. The left ventricle has mildly decreased function. The left ventricle has no regional wall motion abnormalities. The left ventricular internal cavity size was normal in size. There is moderate left ventricular hypertrophy. Left ventricular diastolic parameters are indeterminate. Right Ventricle:  The right ventricular size is normal. No increase in right ventricular wall thickness. Right ventricular systolic function is normal. There is moderately elevated pulmonary artery systolic pressure. The tricuspid regurgitant velocity is 2.94 m/s, and with an assumed right atrial pressure of 18 mmHg, the estimated right ventricular systolic pressure is 52.6 mmHg. Left Atrium: Left atrial size was severely dilated. Right Atrium: Right atrial size was normal in size. Pericardium: There is no evidence  of pericardial effusion. Mitral Valve: The mitral valve is normal in structure. There is mild calcification of the mitral valve leaflet(s). Mild mitral annular calcification. Moderate to severe mitral valve regurgitation. No evidence of mitral valve stenosis. MV peak gradient, 8.8 mmHg. The mean mitral valve gradient is 3.5 mmHg. Tricuspid Valve: The tricuspid valve is normal in structure. Tricuspid valve regurgitation is moderate to severe. No evidence of tricuspid stenosis. Aortic Valve: The aortic valve has an indeterminant number of cusps. There is mild calcification of the aortic valve. Aortic valve regurgitation is mild. Aortic valve sclerosis is present, with no evidence of aortic valve stenosis. Aortic valve mean gradient measures 3.3 mmHg. Aortic valve peak gradient measures 8.1 mmHg. Aortic valve area, by VTI measures 2.40 cm. Pulmonic Valve: The pulmonic valve was normal in structure. Pulmonic valve regurgitation is mild. No evidence of pulmonic stenosis. Aorta: The aortic root is normal in size and structure. Venous: The inferior vena cava is normal in size with greater than 50% respiratory variability, suggesting right atrial pressure of 3 mmHg. IAS/Shunts: No atrial level shunt detected by color flow Doppler. Agitated saline contrast was given intravenously to evaluate for intracardiac shunting.  LEFT VENTRICLE PLAX 2D LVIDd:         4.40 cm LVIDs:         3.40 cm LV PW:         1.40 cm LV IVS:        1.40  cm LVOT diam:     1.90 cm LV SV:         61 LV SV Index:   33 LVOT Area:     2.84 cm  RIGHT VENTRICLE RV Basal diam:  3.10 cm RV Mid diam:    2.50 cm RV S prime:     11.00 cm/s LEFT ATRIUM              Index        RIGHT ATRIUM           Index LA diam:        4.60 cm  2.49 cm/m   RA Area:     18.40 cm LA Vol (A2C):   100.0 ml 54.21 ml/m  RA Volume:   40.70 ml  22.06 ml/m LA Vol (A4C):   86.6 ml  46.95 ml/m LA Biplane Vol: 103.0 ml 55.84 ml/m  AORTIC VALVE                    PULMONIC VALVE AV Area (Vmax):    2.33 cm     PV Vmax:       1.14 m/s AV Area (Vmean):   2.37 cm     PV Peak grad:  5.2 mmHg AV Area (VTI):     2.40 cm AV Vmax:           142.00 cm/s AV Vmean:          84.467 cm/s AV VTI:            0.253 m AV Peak Grad:      8.1 mmHg AV Mean Grad:      3.3 mmHg LVOT Vmax:         116.50 cm/s LVOT Vmean:        70.650 cm/s LVOT VTI:          0.214 m LVOT/AV VTI ratio: 0.84  AORTA Ao Root diam: 3.30 cm Ao Asc diam:  3.60 cm MITRAL VALVE  TRICUSPID VALVE MV Area (PHT): 2.70 cm     TR Peak grad:   34.6 mmHg MV Area VTI:   2.08 cm     TR Vmax:        294.00 cm/s MV Peak grad:  8.8 mmHg MV Mean grad:  3.5 mmHg     SHUNTS MV Vmax:       1.48 m/s     Systemic VTI:  0.21 m MV Vmean:      79.8 cm/s    Systemic Diam: 1.90 cm MV Decel Time: 281 msec MV E velocity: 127.00 cm/s Julien Nordmann MD Electronically signed by Julien Nordmann MD Signature Date/Time: 08/27/2022/12:07:46 PM    Final    DG Chest 2 View  Result Date: 08/26/2022 CLINICAL DATA:  Cough and shortness of breath x 3 days. EXAM: CHEST - 2 VIEW COMPARISON:  None Available. FINDINGS: The heart and mediastinal contours are within normal limits. Prominent main pulmonary artery. Atherosclerotic plaque. Hyperinflation of the lungs with flattening of the bilateral hemidiaphragms. No focal consolidation. Chronic coarsened interstitial markings with no overt pulmonary edema. Bilateral at least trace pleural effusions. No pneumothorax. No  acute osseous abnormality. IMPRESSION: 1. At least trace bilateral pleural effusions. 2. Prominent main pulmonary artery which may be due to positioning versus pulmonary hypertension. 3. Aortic Atherosclerosis (ICD10-I70.0) and Emphysema (ICD10-J43.9). Electronically Signed   By: Tish Frederickson M.D.   On: 08/26/2022 17:31    Microbiology: Results for orders placed or performed during the hospital encounter of 08/26/22  SARS Coronavirus 2 by RT PCR (hospital order, performed in [facility] hospital lab) *cepheid single result test* Anterior Nasal Swab     Status: None   Collection Time: 08/26/22  3:42 PM   Specimen: Anterior Nasal Swab  Result Value Ref Range Status   SARS Coronavirus 2 by RT PCR NEGATIVE NEGATIVE Final    Comment: (NOTE) SARS-CoV-2 target nucleic acids are NOT DETECTED.  The SARS-CoV-2 RNA is generally detectable in upper and lower respiratory specimens during the acute phase of infection. The lowest concentration of SARS-CoV-2 viral copies this assay can detect is 250 copies / mL. A negative result does not preclude SARS-CoV-2 infection and should not be used as the sole basis for treatment or other patient management decisions.  A negative result may occur with improper specimen collection / handling, submission of specimen other than nasopharyngeal swab, presence of viral mutation(s) within the areas targeted by this assay, and inadequate number of viral copies (<250 copies / mL). A negative result must be combined with clinical observations, patient history, and epidemiological information.  Fact Sheet for Patients:   RoadLapTop.co.za  Fact Sheet for Healthcare Providers: http://kim-miller.com/  This test is not yet approved or  cleared by the [place] FDA and has been authorized for detection and/or diagnosis of SARS-CoV-2 by FDA under an Emergency Use Authorization (EUA).  This EUA will remain in effect (meaning  this test can be used) for the duration of the COVID-19 declaration under Section 564(b)(1) of the Act, 21 U.S.C. section 360bbb-3(b)(1), unless the authorization is terminated or revoked sooner.  Performed at [facility], [address], [place], [place] [address]     Labs: CBC: Recent Labs  Lab 08/26/22 1817 08/27/22 0525  WBC 8.7 7.2  NEUTROABS 6.1  --   HGB 12.7 11.4*  HCT 38.2 34.9*  MCV 91.4 94.1  PLT 274 238   Basic Metabolic Panel: Recent Labs  Lab 08/26/22 1817 08/27/22 0525 08/28/22 0510  08/29/22 0454  NA 137 141 138 139  K 4.0 3.5 3.6 4.0  CL 104 113* 104 100  CO2 21* 25 26 25   GLUCOSE 116* 86 94 87  BUN 21 22 22  27*  CREATININE 1.02* 1.07* 1.05* 1.08*  CALCIUM 9.5 8.8* 9.3 9.5  MG  --  2.0  --   --    Liver Function Tests: Recent Labs  Lab 08/27/22 0525  AST 20  ALT 18  ALKPHOS 58  BILITOT 0.6  PROT 6.0*  ALBUMIN 3.2*   CBG: No results for input(s): "GLUCAP" in the last 168 hours.  Discharge time spent: greater than 30 minutes.  This record has been created using Conservation officer, historic buildings. Errors have been sought and corrected,but may not always be located. Such creation errors do not reflect on the standard of care.   Signed: Arnetha Courser, MD [facility] 08/29/2022

## 2022-08-29 NOTE — TOC Benefit Eligibility Note (Signed)
Pharmacy Patient Advocate Encounter  Insurance verification completed.    The patient is insured through  [employer] . Patient has Medicare and is not eligible for a copay card, but may be able to apply for patient assistance, if available.    Ran test claim for Eliquis  and the current 30 day co-pay is $47.00.   This test claim was processed through [facility]- copay amounts may vary at other pharmacies due to pharmacy/plan contracts, or as the patient moves through the different stages of their insurance plan.

## 2022-09-11 NOTE — Progress Notes (Signed)
Cardiology Office Note  Date:  09/12/2022   ID:  TRECIA Hess, DOB 1933-03-11, MRN [id number]  PCP:  Luciana Axe, NP   Chief Complaint  Patient presents with   [facility] follow up     Patient c/o loss of appetite, weakness & no energy. Medications reviewed by the patient verbally.     HPI:  [name] is a 89 year old woman with past medical history of  hypertension,  chronic kidney disease stage III,  Hospitalized August 27, 2022 with worsening shortness of breath for 3 weeks prior to admission, PND/orthopnea, Who presents for office follow-up for her atrial fibrillation with RVR, pulmonary edema, bilateral pleural effusions  Recent hospitalization reviewed August 2024 worsening shortness of breath,  unable to sleep,  family very concerned so they called EMS.   Denies significant leg swelling, no abdominal swelling   Seen in urgent care and transferred to emergency room August 20 for shortness of breath and cough.  She was hypoxic on ambulation primary care 88% on room air Testing negative Chest x-ray bilateral effusions worse on the left, pulmonary vascular congestion EKG performed showing atrial fibrillation with RVR left bundle branch block BNP over 600  While in the hospital,Rate controlled on metoprolol tartrate 25 twice daily, changed to metoprolol succinate 50 daily at discharge with Eliquis 5 twice daily Plan for outpatient cardioversion Discharged on Lasix 20 daily  On further discussion today, Reports that she feels weak, not as much energy  Denies significant shortness of breath, no PND orthopnea appreciates atrial fibrillation from tracking pulse oximeter, heart rate is variable  EKG personally reviewed by myself on todays visit EKG Interpretation Date/Time:  Friday September 12 2022 11:18:30 EDT Ventricular Rate:  109 PR Interval:    QRS Duration:  140 QT Interval:  376 QTC Calculation: 506 R Axis:   -48  Text Interpretation: Atrial fibrillation  with rapid ventricular response with premature ventricular or aberrantly conducted complexes Left axis deviation Left bundle branch block When compared with ECG of 26-Aug-2022 18:12, No significant change was found Confirmed by Julien Nordmann [phone]) on 09/12/2022 11:23:11 AM    PMH:   has a past medical history of Breast cancer (HCC) (06/05/1999), Hypertension, Personal history of radiation therapy, and Thyroid disease.  PSH:    Past Surgical History:  Procedure Laterality Date   BREAST BIOPSY Left 06/05/99   lumpectomy/rad   BREAST LUMPECTOMY Left 2001   CHOLECYSTECTOMY      Current Outpatient Medications  Medication Sig Dispense Refill   apixaban (ELIQUIS) 5 MG TABS tablet Take 1 tablet (5 mg total) by mouth 2 (two) times daily. 60 tablet 2   fluticasone (FLONASE) 50 MCG/ACT nasal spray Place 2 sprays into both nostrils daily.     furosemide (LASIX) 20 MG tablet Take 1 tablet (20 mg total) by mouth daily. 30 tablet 1   gabapentin (NEURONTIN) 300 MG capsule Take 1 capsule by mouth at bedtime.     levothyroxine (SYNTHROID) 75 MCG tablet Take 75 mcg by mouth daily before breakfast.     metoprolol tartrate (LOPRESSOR) 25 MG tablet Take 1 tablet (25 mg total) by mouth 2 (two) times daily. 60 tablet 1   Multiple Vitamin (MULTI-VITAMINS) TABS Take by mouth.     pravastatin (PRAVACHOL) 40 MG tablet Take 40 mg by mouth at bedtime.     No current facility-administered medications for this visit.     Allergies:   Patient has no known allergies.   Social History:  The patient  reports that she quit smoking about 53 years ago. Her smoking use included cigarettes. She started smoking about 63 years ago. She has a 10 pack-year smoking history. She has never used smokeless tobacco. She reports that she does not drink alcohol and does not use drugs.   Family History:   family history includes Heart attack (age of onset: 66) in her father; Hypertension in her mother; Other in her mother.    Review  of Systems: Review of Systems  Constitutional: Negative.   HENT: Negative.    Respiratory: Negative.    Cardiovascular: Negative.   Gastrointestinal: Negative.   Musculoskeletal: Negative.   Neurological: Negative.   Psychiatric/Behavioral: Negative.    All other systems reviewed and are negative.   PHYSICAL EXAM: VS:  BP 100/70 (BP Location: Right Arm, Patient Position: Sitting, Cuff Size: Normal)   Pulse 100   Ht 5\' 3"  (1.6 m)   Wt 147 lb (66.7 kg)   SpO2 98%   BMI 26.04 kg/m  , BMI Body mass index is 26.04 kg/m. GEN: Well nourished, well developed, in no acute distress HEENT: normal Neck: no JVD, carotid bruits, or masses Cardiac: RRR; no murmurs, rubs, or gallops,no edema  Respiratory:  clear to auscultation bilaterally, normal work of breathing GI: soft, nontender, nondistended, + BS MS: no deformity or atrophy Skin: warm and dry, no rash Neuro:  Strength and sensation are intact Psych: euthymic mood, full affect  Recent Labs: 08/26/2022: B Natriuretic Peptide 618.6; TSH 3.666 08/27/2022: ALT 18; Hemoglobin 11.4; Magnesium 2.0; Platelets 238 08/29/2022: BUN 27; Creatinine, Ser 1.08; Potassium 4.0; Sodium 139    Lipid Panel No results found for: "CHOL", "HDL", "LDLCALC", "TRIG"    Wt Readings from Last 3 Encounters:  09/12/22 147 lb (66.7 kg)  08/29/22 147 lb 14.9 oz (67.1 kg)  10/16/18 174 lb 2.6 oz (79 kg)     ASSESSMENT AND PLAN:  Problem List Items Addressed This Visit       Cardiology Problems   Atrial fibrillation with RVR (HCC) - Primary   Relevant Orders   EKG 12-Lead (Completed)   Other Visit Diagnoses     Tricuspid valve insufficiency, unspecified etiology       Relevant Orders   EKG 12-Lead (Completed)   Mitral valve insufficiency, unspecified etiology       Relevant Orders   EKG 12-Lead (Completed)   Chronic pulmonary edema       Essential hypertension       Relevant Orders   EKG 12-Lead (Completed)       Atrial fibrillation with  RVR Likely developed early August if not in July Hospitalized August 2024 started on metoprolol, Eliquis 2 weeks ago with Lasix daily -Reports symptoms relatively well-controlled but has fatigue, low energy -Discussed various treatment options for atrial fibrillation including rate control versus  Attempt to restore normal sinus rhythm I am concerned about severely dilated left atrium on echo and recurrence of A-fib following cardioversion We have suggested that we consider 2 more weeks of anticoagulation, at that time follow-up in clinic, we could initiate amiodarone for 2 weeks with cardioversion if atrial fibrillation persists  Valvular heart disease Moderate to severe tricuspid valve regurgitation, moderate to severe mitral valve regurgitation consistent with atrial fibrillation, pulmonary edema, pulmonary hypertension -Continue Lasix, salt restriction Extra Lasix as needed for shortness of breath, PND/orthopnea   Shortness of breath Admission to the hospital August 2024 with pulmonary edema, pulmonary hypertension, atrial fibrillation with RVR Continue Lasix daily   Essential  hypertension Blood pressure running low, Coreg, losartan, amlodipine on hold Continue metoprolol to tartrate 25 twice daily with Lasix daily     Total encounter time more than 40 minutes  Greater than 50% was spent in counseling and coordination of care with the patient    Signed, Dossie Arbour, M.D., Ph.D. [facility] [place], [place] [phone]

## 2022-09-12 ENCOUNTER — Encounter: Payer: Self-pay | Admitting: Cardiovascular Disease

## 2022-09-12 ENCOUNTER — Ambulatory Visit: Payer: Medicare HMO | Attending: Cardiovascular Disease | Admitting: Cardiovascular Disease

## 2022-09-12 VITALS — BP 100/70 | HR 100 | Ht 63.0 in | Wt 147.0 lb

## 2022-09-12 DIAGNOSIS — I340 Nonrheumatic mitral (valve) insufficiency: Secondary | ICD-10-CM | POA: Diagnosis not present

## 2022-09-12 DIAGNOSIS — J811 Chronic pulmonary edema: Secondary | ICD-10-CM | POA: Diagnosis not present

## 2022-09-12 DIAGNOSIS — I4891 Unspecified atrial fibrillation: Secondary | ICD-10-CM | POA: Diagnosis not present

## 2022-09-12 DIAGNOSIS — I071 Rheumatic tricuspid insufficiency: Secondary | ICD-10-CM

## 2022-09-12 DIAGNOSIS — I10 Essential (primary) hypertension: Secondary | ICD-10-CM

## 2022-09-12 NOTE — Patient Instructions (Signed)
Medication Instructions:  No changes  If you need a refill on your cardiac medications before your next appointment, please call your pharmacy.   Lab work: No new labs needed  Testing/Procedures: No new testing needed  Follow-Up: At [facility], you and your health needs are our priority.  As part of our continuing mission to provide you with exceptional heart care, we have created designated Provider Care Teams.  These Care Teams include your primary Cardiologist (physician) and Advanced Practice Providers (APPs -  Physician Assistants and Nurse Practitioners) who all work together to provide you with the care you need, when you need it.  You will need a follow up appointment in 2-3 weeks with Nichole Hess  Providers on your designated Care Team:   Nichole Ducking, NP Nichole Listen, PA-C Nichole Hess, [place]  COVID-19 Vaccine Information can be found at: [email or web address] For questions related to vaccine distribution or appointments, please email [email or web address] or call [phone].

## 2022-09-16 ENCOUNTER — Telehealth: Payer: Self-pay | Admitting: Cardiovascular Disease

## 2022-09-16 NOTE — Telephone Encounter (Signed)
Pt is requesting a callback to discuss medications and to get clarity on what to take and what not to take. Please advise

## 2022-09-16 NOTE — Telephone Encounter (Signed)
Left message to call back  

## 2022-09-23 ENCOUNTER — Other Ambulatory Visit: Payer: Self-pay

## 2022-09-23 ENCOUNTER — Emergency Department
Admission: EM | Admit: 2022-09-23 | Discharge: 2022-09-23 | Disposition: A | Discharge status: Home / Self Care | Payer: Medicare HMO | Attending: Emergency Medicine | Admitting: Emergency Medicine

## 2022-09-23 ENCOUNTER — Emergency Department: Payer: Medicare HMO

## 2022-09-23 DIAGNOSIS — R0601 Orthopnea: Secondary | ICD-10-CM | POA: Diagnosis present

## 2022-09-23 DIAGNOSIS — I509 Heart failure, unspecified: Secondary | ICD-10-CM | POA: Insufficient documentation

## 2022-09-23 DIAGNOSIS — Z7901 Long term (current) use of anticoagulants: Secondary | ICD-10-CM | POA: Insufficient documentation

## 2022-09-23 LAB — CBC
HCT: 41 % (ref 36.0–46.0)
Hemoglobin: 13.6 g/dL (ref 12.0–15.0)
MCH: 30.7 pg (ref 26.0–34.0)
MCHC: 33.2 g/dL (ref 30.0–36.0)
MCV: 92.6 fL (ref 80.0–100.0)
Platelets: 226 10*3/uL (ref 150–400)
RBC: 4.43 MIL/uL (ref 3.87–5.11)
RDW: 14.5 % (ref 11.5–15.5)
WBC: 7.7 10*3/uL (ref 4.0–10.5)
nRBC: 0 % (ref 0.0–0.2)

## 2022-09-23 LAB — BRAIN NATRIURETIC PEPTIDE: B Natriuretic Peptide: 1066.5 pg/mL — ABNORMAL HIGH (ref 0.0–100.0)

## 2022-09-23 LAB — BASIC METABOLIC PANEL
Anion gap: 11 (ref 5–15)
BUN: 29 mg/dL — ABNORMAL HIGH (ref 8–23)
CO2: 21 mmol/L — ABNORMAL LOW (ref 22–32)
Calcium: 9.3 mg/dL (ref 8.9–10.3)
Chloride: 99 mmol/L (ref 98–111)
Creatinine, Ser: 1.22 mg/dL — ABNORMAL HIGH (ref 0.44–1.00)
GFR, Estimated: 43 mL/min — ABNORMAL LOW (ref >60)
Glucose, Bld: 109 mg/dL — ABNORMAL HIGH (ref 70–99)
Potassium: 3.8 mmol/L (ref 3.5–5.1)
Sodium: 131 mmol/L — ABNORMAL LOW (ref 135–145)

## 2022-09-23 MED ORDER — METOPROLOL TARTRATE 25 MG PO TABS
25.0000 mg | ORAL_TABLET | Freq: Once | ORAL | Status: AC
  Administered 2022-09-23: 25 mg via ORAL
  Filled 2022-09-23: qty 1

## 2022-09-23 MED ORDER — APIXABAN 5 MG PO TABS
5.0000 mg | ORAL_TABLET | Freq: Two times a day (BID) | ORAL | Status: DC
  Administered 2022-09-23: 5 mg via ORAL
  Filled 2022-09-23: qty 1

## 2022-09-23 MED ORDER — FUROSEMIDE 10 MG/ML IJ SOLN
80.0000 mg | Freq: Once | INTRAMUSCULAR | Status: AC
  Administered 2022-09-23: 80 mg via INTRAVENOUS
  Filled 2022-09-23: qty 8

## 2022-09-23 NOTE — Discharge Instructions (Signed)
Call your heart doctor in the morning to further discuss your medications and management of your heart failure.  If you feel worse in any way come right back to the emergency department.

## 2022-09-23 NOTE — ED Triage Notes (Signed)
Pt comes with c/o increased sob and weight gain for about week. Pt having to take breaks in between speaking. Pt denies any pain. Pt also sob when walking.

## 2022-09-23 NOTE — ED Notes (Signed)
Pt ambulated to toilet with stand by assist and back to bed.

## 2022-09-23 NOTE — ED Provider Notes (Addendum)
[facility] Provider Note    Event Date/Time   First MD Initiated Contact with Patient 09/23/22 1558     (approximate)   History   Shortness of Breath and Weight Gain   HPI  Nichole Hess is a 89 y.o. female   Past medical history of atrial fibrillation on Eliquis, CHF here with orthopnea and exertional dyspnea worsening over the last couple of weeks.  Approximately 10 pound weight gain.  Denies respiratory infectious symptoms or chest pain.  No GI or GU symptoms.  Has been compliant with her diuretic.  Independent Historian contributed to assessment above: Her family members at bedside corroborate information past medical history as above  External Medical Documents Reviewed: Discharge summary from August 2024 documented past medical history and hospitalization course for atrial fibrillation with RVR as well as pulmonary edema      Physical Exam   Triage Vital Signs: ED Triage Vitals  Encounter Vitals Group     BP 09/23/22 1301 122/75     Systolic BP Percentile --      Diastolic BP Percentile --      Pulse Rate 09/23/22 1301 88     Resp 09/23/22 1301 (!) 22     Temp 09/23/22 1301 97.7 F (36.5 C)     Temp src --      SpO2 09/23/22 1301 96 %     Weight 09/23/22 1304 147 lb (66.7 kg)     Height 09/23/22 1304 5\' 3"  (1.6 m)     Head Circumference --      Peak Flow --      Pain Score 09/23/22 1301 0     Pain Loc --      Pain Education --      Exclude from Growth Chart --     Most recent vital signs: Vitals:   09/23/22 1900 09/23/22 1910  BP: (!) 120/98 (!) 120/98  Pulse: 68 97  Resp: (!) 24 (!) 25  Temp:  97.8 F (36.6 C)  SpO2: (!) 86% 98%    General: Awake, no distress.  CV:  Good peripheral perfusion.  Resp:  Normal effort.  Abd:  No distention.  Other:  No respiratory distress no hypoxemia, rales at bilateral lung bases, heart rate in the low 100s, normotensive and afebrile.  Mentation is normal, pleasant woman in no acute  distress.   ED Results / Procedures / Treatments   Labs (all labs ordered are listed, but only abnormal results are displayed) Labs Reviewed  BASIC METABOLIC PANEL - Abnormal; Notable for the following components:      Result Value   Sodium 131 (*)    CO2 21 (*)    Glucose, Bld 109 (*)    BUN 29 (*)    Creatinine, Ser 1.22 (*)    GFR, Estimated 43 (*)    All other components within normal limits  BRAIN NATRIURETIC PEPTIDE - Abnormal; Notable for the following components:   B Natriuretic Peptide 1,066.5 (*)    All other components within normal limits  CBC     I ordered and reviewed the above labs they are notable for proBNP is elevated at 1000.  EKG  ED ECG REPORT I, Pilar Jarvis, the attending physician, personally viewed and interpreted this ECG.   Date: 09/23/2022  EKG Time: 1308  Rate:104  Rhythm: AF  Axis: nl  Intervals: Nonspecific intraventricular conduction delay  ST&T Change: no stemi    RADIOLOGY I independently reviewed and  interpreted chest x-ray and see some diffuse opacities at the lower lung portions diffusely consistent with pulmonary edema I also reviewed radiologist's formal read.   PROCEDURES:  Critical Care performed: No  Procedures   MEDICATIONS ORDERED IN ED: Medications  apixaban (ELIQUIS) tablet 5 mg (5 mg Oral Given 09/23/22 1910)  furosemide (LASIX) injection 80 mg (80 mg Intravenous Given 09/23/22 1812)  metoprolol tartrate (LOPRESSOR) tablet 25 mg (25 mg Oral Given 09/23/22 1910)     IMPRESSION / MDM / ASSESSMENT AND PLAN / ED COURSE  I reviewed the triage vital signs and the nursing notes.                                Patient's presentation is most consistent with acute presentation with potential threat to life or bodily function.  Differential diagnosis includes, but is not limited to, CHF exacerbation, pulmonary edema, atrial fibrillation with RVR, considered but less likely ACS or PE or respiratory infection   The  patient is on the cardiac monitor to evaluate for evidence of arrhythmia and/or significant heart rate changes.  MDM:   Patient with weight gain orthopnea exertional dyspnea with rales and pulmonary edema on chest x-ray consistent with CHF exacerbation.  Mild symptoms, no hypoxemia or respiratory distress.  Considered admission but she would very much like to avoid hospitalization, so we will trial IV diuresis in the emergency department reassess symptoms for disposition.  She has close follow-up with cardiology in a couple of days later this week.  I doubt PE given anticoagulated status, doubt ACS given nonischemic EKG and no chest pain, doubt respiratory infection given no infectious symptoms.   -- Patient with large urine output, unmeasured because she went to the toilet.  Feels improved.  Would like to be discharged at this time, again I offered admission but she refuses at this time wanting to be discharged home with ongoing diuresis at home and follow-up with cardiologist as scheduled later this week.  Understands to return with any new or worsening symptoms.      FINAL CLINICAL IMPRESSION(S) / ED DIAGNOSES   Final diagnoses:  Acute on chronic congestive heart failure, unspecified heart failure type (HCC)     Rx / DC Orders   ED Discharge Orders     None        Note:  This document was prepared using Dragon voice recognition software and may include unintentional dictation errors.    Pilar Jarvis, MD 09/23/22 Herbie [place]    Pilar Jarvis, MD 09/23/22 2030

## 2022-09-23 NOTE — Progress Notes (Signed)
Cardiology Office Note  Date:  09/26/2022   ID:  Nichole Hess, DOB [birth year], MRN [id number]  PCP:  Luciana Axe, NP   Chief Complaint  Patient presents with   2-3 week follow up      Patient was at [facility] ER; shortness of breath. Medications reviewed by the patient verbally.     HPI:  [name] is a 89 year old woman with past medical history of  hypertension,  chronic kidney disease stage III,  Hospitalized August 27, 2022 with worsening shortness of breath for 3 weeks prior to admission, PND/orthopnea, Who presents for office follow-up for her atrial fibrillation with RVR, pulmonary edema, bilateral pleural effusions  Recently seen in clinic by myself September 12, 2022  In the hospital August 2024 with pulmonary vascular congestion, atrial fibrillation with RVR Treated with metoprolol, Eliquis, Lasix  In ER 9/17, worsening shortness of breath, treated with lasix IV Felt better and was discharged home  In follow-up today sleeping on 2 pillows, mild shortness of breath on exertion, low energy Interested in restoring normal sinus rhythm  Denies significant lower extremity edema EKG personally reviewed by myself on todays visit EKG Interpretation Date/Time:  Friday September 26 2022 11:51:51 EDT Ventricular Rate:  104 PR Interval:    QRS Duration:  144 QT Interval:  388 QTC Calculation: 510 R Axis:   -43  Text Interpretation: Atrial fibrillation with rapid ventricular response Left axis deviation Left bundle branch block When compared with ECG of 23-Sep-2022 13:08, QT has lengthened Confirmed by Julien Nordmann ([id number]) on 09/26/2022 12:08:16 PM    hospitalization August 2024 worsening shortness of breath,  unable to sleep,  family very concerned so they called EMS.   Denies significant leg swelling, no abdominal swelling   Seen in urgent care and transferred to emergency room August 20 for shortness of breath and cough.  She was hypoxic on ambulation primary  care 88% on room air Testing negative Chest x-ray bilateral effusions worse on the left, pulmonary vascular congestion EKG performed showing atrial fibrillation with RVR left bundle branch block BNP over 600  While in the hospital,Rate controlled on metoprolol tartrate 25 twice daily, changed to metoprolol succinate 50 daily at discharge with Eliquis 5 twice daily Plan for outpatient cardioversion Discharged on Lasix 20 daily   PMH:   has a past medical history of Breast cancer (HCC) (06/05/1999), Hypertension, Personal history of radiation therapy, and Thyroid disease.  PSH:    Past Surgical History:  Procedure Laterality Date   BREAST BIOPSY Left 06/05/99   lumpectomy/rad   BREAST LUMPECTOMY Left 2001   CHOLECYSTECTOMY      Current Outpatient Medications  Medication Sig Dispense Refill   apixaban (ELIQUIS) 5 MG TABS tablet Take 1 tablet (5 mg total) by mouth 2 (two) times daily. 60 tablet 2   fluticasone (FLONASE) 50 MCG/ACT nasal spray Place 2 sprays into both nostrils daily.     furosemide (LASIX) 20 MG tablet Take 1 tablet (20 mg total) by mouth daily. 30 tablet 1   gabapentin (NEURONTIN) 300 MG capsule Take 1 capsule by mouth at bedtime.     levothyroxine (SYNTHROID) 75 MCG tablet Take 75 mcg by mouth daily before breakfast.     metoprolol tartrate (LOPRESSOR) 25 MG tablet Take 1 tablet (25 mg total) by mouth 2 (two) times daily. 60 tablet 1   Multiple Vitamin (MULTI-VITAMINS) TABS Take by mouth.     pravastatin (PRAVACHOL) 40 MG tablet Take 40 mg by mouth at  bedtime.     No current facility-administered medications for this visit.     Allergies:   Patient has no known allergies.   Social History:  The patient  reports that she quit smoking about 53 years ago. Her smoking use included cigarettes. She started smoking about 63 years ago. She has a 10 pack-year smoking history. She has never used smokeless tobacco. She reports that she does not drink alcohol and does not use  drugs.   Family History:   family history includes Heart attack (age of onset: 19) in her father; Hypertension in her mother; Other in her mother.    Review of Systems: Review of Systems  Constitutional: Negative.   HENT: Negative.    Respiratory: Negative.    Cardiovascular: Negative.   Gastrointestinal: Negative.   Musculoskeletal: Negative.   Neurological: Negative.   Psychiatric/Behavioral: Negative.    All other systems reviewed and are negative.   PHYSICAL EXAM: VS:  BP 100/60 (BP Location: Left Arm, Patient Position: Sitting, Cuff Size: Normal)   Pulse (!) 104   Ht 5\' 3"  (1.6 m)   Wt 152 lb (68.9 kg)   SpO2 95%   BMI 26.93 kg/m  , BMI Body mass index is 26.93 kg/m. Constitutional:  oriented to person, place, and time. No distress.  HENT:  Head: Grossly normal Eyes:  no discharge. No scleral icterus.  Neck: No JVD, no carotid bruits  Cardiovascular: Regular rate and rhythm, no murmurs appreciated Pulmonary/Chest: Clear to auscultation bilaterally, no wheezes or rails Abdominal: Soft.  no distension.  no tenderness.  Musculoskeletal: Normal range of motion Neurological:  normal muscle tone. Coordination normal. No atrophy Skin: Skin warm and dry Psychiatric: normal affect, pleasant   Recent Labs: 08/26/2022: TSH 3.666 08/27/2022: ALT 18; Magnesium 2.0 09/23/2022: B Natriuretic Peptide 1,066.5; BUN 29; Creatinine, Ser 1.22; Hemoglobin 13.6; Platelets 226; Potassium 3.8; Sodium 131    Lipid Panel No results found for: "CHOL", "HDL", "LDLCALC", "TRIG"    Wt Readings from Last 3 Encounters:  09/26/22 152 lb (68.9 kg)  09/23/22 147 lb (66.7 kg)  09/12/22 147 lb (66.7 kg)     ASSESSMENT AND PLAN:  Problem List Items Addressed This Visit       Cardiology Problems   Atrial fibrillation with RVR (HCC) - Primary   Relevant Orders   EKG 12-Lead (Completed)   Dilated cardiomyopathy (HCC)   Relevant Orders   EKG 12-Lead (Completed)     Other   CKD (chronic  kidney disease) stage 3, GFR 30-59 ml/min (HCC)   SOB (shortness of breath)   Other Visit Diagnoses     Tricuspid valve insufficiency, unspecified etiology       Mitral valve insufficiency, unspecified etiology       Relevant Orders   EKG 12-Lead (Completed)   Chronic pulmonary edema       Relevant Orders   EKG 12-Lead (Completed)   Essential hypertension       Relevant Orders   EKG 12-Lead (Completed)        Atrial fibrillation with RVR Likely developed early August if not in July 2024 Elevated rate on today's visit Recommended she start amiodarone 400 twice daily 7 days then down to 200 twice daily Cardioversion in 2 weeks time if she remains in atrial fibrillation Remain on Eliquis 5 twice daily, does not meet criteria for reduced dose  Valvular heart disease Moderate to severe tricuspid valve regurgitation, moderate to severe mitral valve regurgitation consistent with atrial fibrillation, pulmonary edema,  pulmonary hypertension -Recommend to increase Lasix up to 40 daily, moderate fluid intake, extra Lasix 40 in the afternoon for any shortness of breath or abdominal swelling -Recommend she take potassium 10 mill equivalents with any Lasix dose   Shortness of breath Secondary to pulmonary edema and atrial fibrillation, MR, TR Lasix increased as above   Essential hypertension Blood pressure running low,  Continue metoprolol tartrate 25 twice daily, Lasix 40 daily     Total encounter time more than 40 minutes  Greater than 50% was spent in counseling and coordination of care with the patient    Signed, Dossie Arbour, M.D., Ph.D. [facility] [place], [place] [phone]

## 2022-09-23 NOTE — ED Notes (Signed)
Pt eating dinner tray at this time

## 2022-09-23 NOTE — ED Provider Triage Note (Signed)
Emergency Medicine Provider Triage Evaluation Note  Nichole Hess , a 89 y.o. female  was evaluated in triage.  Pt complains of SOB. Patient states her symptoms have gradually worsened after the last week. Patient has heart failure and afib.   Patient was informed by Dr. Elonda Husky to return to the ED if she had any difficulty breathing or weight gain.   Review of Systems  Positive: SOB Negative: Leg swelling  Physical Exam  BP 122/75   Pulse 88   Temp 97.7 F (36.5 C)   Resp (!) 22   Ht 5\' 3"  (1.6 m)   Wt 66.7 kg   SpO2 96%   BMI 26.04 kg/m  Gen:   Awake, no distress   Resp:  Normal effort, crackles in lung bases bilaterally MSK:   Moves extremities without difficulty  Other:    Medical Decision Making  Medically screening exam initiated at 1:05 PM.  Appropriate orders placed.  Nichole Hess was informed that the remainder of the evaluation will be completed by another provider, this initial triage assessment does not replace that evaluation, and the importance of remaining in the ED until their evaluation is complete.    Cameron Ali, PA-C 09/23/22 1308

## 2022-09-24 NOTE — Telephone Encounter (Signed)
Left a message for the patient to call back.  

## 2022-09-25 NOTE — Telephone Encounter (Signed)
Attempted to contact pt x 3.  Pt has an appointment scheduled for tomorrow 09/26/22.

## 2022-09-26 ENCOUNTER — Ambulatory Visit: Payer: Medicare HMO | Attending: Cardiovascular Disease | Admitting: Cardiovascular Disease

## 2022-09-26 ENCOUNTER — Encounter: Payer: Self-pay | Admitting: Cardiovascular Disease

## 2022-09-26 VITALS — BP 100/60 | HR 104 | Ht 63.0 in | Wt 152.0 lb

## 2022-09-26 DIAGNOSIS — J811 Chronic pulmonary edema: Secondary | ICD-10-CM | POA: Diagnosis not present

## 2022-09-26 DIAGNOSIS — Z79899 Other long term (current) drug therapy: Secondary | ICD-10-CM

## 2022-09-26 DIAGNOSIS — I420 Dilated cardiomyopathy: Secondary | ICD-10-CM

## 2022-09-26 DIAGNOSIS — I4891 Unspecified atrial fibrillation: Secondary | ICD-10-CM

## 2022-09-26 DIAGNOSIS — I10 Essential (primary) hypertension: Secondary | ICD-10-CM

## 2022-09-26 DIAGNOSIS — I071 Rheumatic tricuspid insufficiency: Secondary | ICD-10-CM

## 2022-09-26 DIAGNOSIS — R0602 Shortness of breath: Secondary | ICD-10-CM

## 2022-09-26 DIAGNOSIS — I340 Nonrheumatic mitral (valve) insufficiency: Secondary | ICD-10-CM

## 2022-09-26 DIAGNOSIS — N183 Chronic kidney disease, stage 3 unspecified: Secondary | ICD-10-CM

## 2022-09-26 LAB — CBC
Hematocrit: 40.5 % (ref 34.0–46.6)
Hemoglobin: 13 g/dL (ref 11.1–15.9)
MCH: 30.5 pg (ref 26.6–33.0)
MCHC: 32.1 g/dL (ref 31.5–35.7)
MCV: 95 fL (ref 79–97)
Platelets: 233 10*3/uL (ref 150–450)
RBC: 4.26 x10E6/uL (ref 3.77–5.28)
RDW: 13.5 % (ref 11.7–15.4)
WBC: 6.7 10*3/uL (ref 3.4–10.8)

## 2022-09-26 LAB — BASIC METABOLIC PANEL
BUN/Creatinine Ratio: 21 (ref 12–28)
BUN: 28 mg/dL — ABNORMAL HIGH (ref 8–27)
CO2: 22 mmol/L (ref 20–29)
Calcium: 10.1 mg/dL (ref 8.7–10.3)
Chloride: 96 mmol/L (ref 96–106)
Creatinine, Ser: 1.33 mg/dL — ABNORMAL HIGH (ref 0.57–1.00)
Glucose: 100 mg/dL — ABNORMAL HIGH (ref 70–99)
Potassium: 4.1 mmol/L (ref 3.5–5.2)
Sodium: 134 mmol/L (ref 134–144)
eGFR: 39 mL/min/{1.73_m2} — ABNORMAL LOW (ref >59)

## 2022-09-26 MED ORDER — AMIODARONE HCL 400 MG PO TABS: 400.0000 mg | ORAL_TABLET | Freq: Two times a day (BID) | ORAL | 0 refills | Status: DC

## 2022-09-26 MED ORDER — AMIODARONE HCL 200 MG PO TABS: 200.0000 mg | ORAL_TABLET | Freq: Two times a day (BID) | ORAL | 1 refills | Status: DC

## 2022-09-26 MED ORDER — FUROSEMIDE 20 MG PO TABS: 40.0000 mg | ORAL_TABLET | Freq: Every day | ORAL | 3 refills | Status: DC

## 2022-09-26 MED ORDER — POTASSIUM CHLORIDE ER 10 MEQ PO TBCR: 10.0000 meq | EXTENDED_RELEASE_TABLET | Freq: Every day | ORAL | 3 refills | Status: DC

## 2022-09-26 NOTE — Addendum Note (Signed)
Addended by: Jani Gravel on: 09/26/2022 01:10 PM   Modules accepted: Orders

## 2022-09-26 NOTE — Patient Instructions (Addendum)
Medication Instructions:  Please increase lasix up to 40 daily with extra 40 in the afternoon for shortness of breath, ABD swelling Take lasix with potassium 10 meq  Amiodarone 400 mg twice a day for 7 days Then 200 mg twice a day  Monitor pulse Cardioversion in 2 weeks  If you need a refill on your cardiac medications before your next appointment, please call your pharmacy.   Lab work: Your provider would like for you to have following labs drawn today CBC and BMP.      Testing/Procedures:     Dear Nichole Hess  You are scheduled for a Cardioversion on Thursday, October 3 with Dr. Mariah Milling.  Please arrive at the [facility] Entrance of [facility], [address], [place] [address] at 6:30 AM (This is 1 hour(s) prior to your procedure time).  Proceed to the Check-In Desk directly inside the entrance.  Procedure Parking: Use the entrance off of the [address] side of the hospital. Turn right upon entering and follow the driveway to parking that is directly in front of the [facility]. There is no valet parking available at this entrance, however there is an awning directly in front of the [facility] Center for drop off/ pick up for patients.    DIET:  Nothing to eat or drink after midnight except a sip of water with medications (see medication instructions below)  MEDICATION INSTRUCTIONS: !!IF ANY NEW MEDICATIONS ARE STARTED AFTER TODAY, PLEASE NOTIFY YOUR PROVIDER AS SOON AS POSSIBLE!!  FYI: Medications such as Semaglutide (Ozempic, [place]), Tirzepatide (Mounjaro, Zepbound), Dulaglutide (Trulicity), etc ("GLP1 agonists") AND Canagliflozin (Invokana), Dapagliflozin (Farxiga), Empagliflozin (Jardiance), Ertugliflozin (Steglatro), Bexagliflozin [employer]) or any combination with one of these drugs such as Invokamet (Canagliflozin/Metformin), Synjardy (Empagliflozin/Metformin), etc ("SGLT2 inhibitors") must be held around the time of a procedure. This is  not a comprehensive list of all of these drugs. Please review all of your medications and talk to your provider if you take any one of these. If you are not sure, ask your provider.  Continue taking your anticoagulant (blood thinner): Apixaban (Eliquis).  You will need to continue this after your procedure until you are told by your provider that it is safe to stop.    Hold Lasix morning of procedure  FYI:  For your safety, and to allow [place] to monitor your vital signs accurately during the surgery/procedure we request: If you have artificial nails, gel coating, SNS etc, please have those removed prior to your surgery/procedure. Not having the nail coverings /polish removed may result in cancellation or delay of your surgery/procedure.  You must have a responsible person to drive you home and stay in the waiting area during your procedure. Failure to do so could result in cancellation.  Bring your insurance cards.  *Special Note: Every effort is made to have your procedure done on time. Occasionally there are emergencies that occur at the hospital that may cause delays. Please be patient if a delay does occur.           Follow-Up: At [facility], you and your health needs are our priority.  As part of our continuing mission to provide you with exceptional heart care, we have created designated Provider Care Teams.  These Care Teams include your primary Cardiologist (physician) and Advanced Practice Providers (APPs -  Physician Assistants and Nurse Practitioners) who all work together to provide you with the care you need, when you need it.  You  will need a follow up appointment in 1 month  Providers on your designated Care Team:   Nicolasa Ducking, NP Eula Listen, PA-C Cadence Fransico Michael, [place]  COVID-19 Vaccine Information can be found at: [email or web address] For questions related to vaccine distribution or appointments, please  email [email or web address] or call [phone].

## 2022-10-05 ENCOUNTER — Other Ambulatory Visit: Payer: Self-pay

## 2022-10-05 ENCOUNTER — Inpatient Hospital Stay
Admission: EM | Admit: 2022-10-05 | Discharge: 2022-10-11 | DRG: 208 | Disposition: A | Discharge status: Home Health | Payer: Medicare HMO | Attending: Internal Medicine | Admitting: Internal Medicine

## 2022-10-05 ENCOUNTER — Emergency Department: Payer: Medicare HMO

## 2022-10-05 DIAGNOSIS — Z79899 Other long term (current) drug therapy: Secondary | ICD-10-CM

## 2022-10-05 DIAGNOSIS — I4819 Other persistent atrial fibrillation: Secondary | ICD-10-CM | POA: Diagnosis present

## 2022-10-05 DIAGNOSIS — R7989 Other specified abnormal findings of blood chemistry: Secondary | ICD-10-CM

## 2022-10-05 DIAGNOSIS — J810 Acute pulmonary edema: Secondary | ICD-10-CM | POA: Diagnosis not present

## 2022-10-05 DIAGNOSIS — N1831 Chronic kidney disease, stage 3a: Secondary | ICD-10-CM | POA: Diagnosis present

## 2022-10-05 DIAGNOSIS — I081 Rheumatic disorders of both mitral and tricuspid valves: Secondary | ICD-10-CM | POA: Diagnosis present

## 2022-10-05 DIAGNOSIS — R570 Cardiogenic shock: Secondary | ICD-10-CM | POA: Diagnosis present

## 2022-10-05 DIAGNOSIS — Z1152 Encounter for screening for COVID-19: Secondary | ICD-10-CM

## 2022-10-05 DIAGNOSIS — J9621 Acute and chronic respiratory failure with hypoxia: Secondary | ICD-10-CM | POA: Diagnosis present

## 2022-10-05 DIAGNOSIS — R001 Bradycardia, unspecified: Secondary | ICD-10-CM | POA: Diagnosis present

## 2022-10-05 DIAGNOSIS — Z6826 Body mass index (BMI) 26.0-26.9, adult: Secondary | ICD-10-CM

## 2022-10-05 DIAGNOSIS — Z79890 Hormone replacement therapy (postmenopausal): Secondary | ICD-10-CM

## 2022-10-05 DIAGNOSIS — Z7901 Long term (current) use of anticoagulants: Secondary | ICD-10-CM

## 2022-10-05 DIAGNOSIS — Z853 Personal history of malignant neoplasm of breast: Secondary | ICD-10-CM

## 2022-10-05 DIAGNOSIS — Z7189 Other specified counseling: Secondary | ICD-10-CM

## 2022-10-05 DIAGNOSIS — E785 Hyperlipidemia, unspecified: Secondary | ICD-10-CM | POA: Diagnosis present

## 2022-10-05 DIAGNOSIS — I509 Heart failure, unspecified: Principal | ICD-10-CM

## 2022-10-05 DIAGNOSIS — J9811 Atelectasis: Secondary | ICD-10-CM | POA: Diagnosis present

## 2022-10-05 DIAGNOSIS — E039 Hypothyroidism, unspecified: Secondary | ICD-10-CM | POA: Diagnosis present

## 2022-10-05 DIAGNOSIS — Z8249 Family history of ischemic heart disease and other diseases of the circulatory system: Secondary | ICD-10-CM

## 2022-10-05 DIAGNOSIS — I130 Hypertensive heart and chronic kidney disease with heart failure and stage 1 through stage 4 chronic kidney disease, or unspecified chronic kidney disease: Principal | ICD-10-CM | POA: Diagnosis present

## 2022-10-05 DIAGNOSIS — Z87891 Personal history of nicotine dependence: Secondary | ICD-10-CM

## 2022-10-05 DIAGNOSIS — E872 Acidosis, unspecified: Secondary | ICD-10-CM | POA: Diagnosis present

## 2022-10-05 DIAGNOSIS — J9601 Acute respiratory failure with hypoxia: Secondary | ICD-10-CM | POA: Diagnosis not present

## 2022-10-05 DIAGNOSIS — I5043 Acute on chronic combined systolic (congestive) and diastolic (congestive) heart failure: Secondary | ICD-10-CM | POA: Diagnosis present

## 2022-10-05 DIAGNOSIS — N179 Acute kidney failure, unspecified: Secondary | ICD-10-CM | POA: Diagnosis present

## 2022-10-05 DIAGNOSIS — E440 Moderate protein-calorie malnutrition: Secondary | ICD-10-CM | POA: Diagnosis present

## 2022-10-05 DIAGNOSIS — N390 Urinary tract infection, site not specified: Secondary | ICD-10-CM | POA: Diagnosis present

## 2022-10-05 DIAGNOSIS — Z923 Personal history of irradiation: Secondary | ICD-10-CM

## 2022-10-05 DIAGNOSIS — B9620 Unspecified Escherichia coli [E. coli] as the cause of diseases classified elsewhere: Secondary | ICD-10-CM | POA: Diagnosis present

## 2022-10-05 DIAGNOSIS — J449 Chronic obstructive pulmonary disease, unspecified: Secondary | ICD-10-CM | POA: Diagnosis present

## 2022-10-05 LAB — COMPREHENSIVE METABOLIC PANEL
ALT: 30 U/L (ref 0–44)
AST: 26 U/L (ref 15–41)
Albumin: 3.8 g/dL (ref 3.5–5.0)
Alkaline Phosphatase: 59 U/L (ref 38–126)
Anion gap: 11 (ref 5–15)
BUN: 30 mg/dL — ABNORMAL HIGH (ref 8–23)
CO2: 26 mmol/L (ref 22–32)
Calcium: 9.5 mg/dL (ref 8.9–10.3)
Chloride: 95 mmol/L — ABNORMAL LOW (ref 98–111)
Creatinine, Ser: 2.28 mg/dL — ABNORMAL HIGH (ref 0.44–1.00)
GFR, Estimated: 20 mL/min — ABNORMAL LOW (ref >60)
Glucose, Bld: 136 mg/dL — ABNORMAL HIGH (ref 70–99)
Potassium: 4.8 mmol/L (ref 3.5–5.1)
Sodium: 132 mmol/L — ABNORMAL LOW (ref 135–145)
Total Bilirubin: 1.3 mg/dL — ABNORMAL HIGH (ref 0.3–1.2)
Total Protein: 7 g/dL (ref 6.5–8.1)

## 2022-10-05 LAB — TROPONIN I (HIGH SENSITIVITY): Troponin I (High Sensitivity): 32 ng/L — ABNORMAL HIGH (ref <18)

## 2022-10-05 LAB — CBC WITH DIFFERENTIAL/PLATELET
Abs Immature Granulocytes: 0.04 10*3/uL (ref 0.00–0.07)
Basophils Absolute: 0.1 10*3/uL (ref 0.0–0.1)
Basophils Relative: 1 %
Eosinophils Absolute: 0.1 10*3/uL (ref 0.0–0.5)
Eosinophils Relative: 2 %
HCT: 45 % (ref 36.0–46.0)
Hemoglobin: 14.4 g/dL (ref 12.0–15.0)
Immature Granulocytes: 1 %
Lymphocytes Relative: 22 %
Lymphs Abs: 1.8 10*3/uL (ref 0.7–4.0)
MCH: 30.4 pg (ref 26.0–34.0)
MCHC: 32 g/dL (ref 30.0–36.0)
MCV: 95.1 fL (ref 80.0–100.0)
Monocytes Absolute: 0.8 10*3/uL (ref 0.1–1.0)
Monocytes Relative: 10 %
Neutro Abs: 5.3 10*3/uL (ref 1.7–7.7)
Neutrophils Relative %: 64 %
Platelets: 263 10*3/uL (ref 150–400)
RBC: 4.73 MIL/uL (ref 3.87–5.11)
RDW: 15 % (ref 11.5–15.5)
WBC: 8.1 10*3/uL (ref 4.0–10.5)
nRBC: 0 % (ref 0.0–0.2)

## 2022-10-05 LAB — PROTIME-INR
INR: 2.3 — ABNORMAL HIGH (ref 0.8–1.2)
Prothrombin Time: 25.2 s — ABNORMAL HIGH (ref 11.4–15.2)

## 2022-10-05 LAB — LACTIC ACID, PLASMA: Lactic Acid, Venous: 1.7 mmol/L (ref 0.5–1.9)

## 2022-10-05 LAB — BRAIN NATRIURETIC PEPTIDE: B Natriuretic Peptide: 1298 pg/mL — ABNORMAL HIGH (ref 0.0–100.0)

## 2022-10-05 MED ORDER — FUROSEMIDE 10 MG/ML IJ SOLN
40.0000 mg | Freq: Once | INTRAMUSCULAR | Status: AC
  Administered 2022-10-06: 40 mg via INTRAVENOUS
  Filled 2022-10-05: qty 4

## 2022-10-05 NOTE — ED Triage Notes (Addendum)
Pt to ED via POV c/o [facility] for about 1 month but worsened this afternoon. Pt was seen for same on sept 17th. Pt pt reports SpO2 was 88% at home. Ox2 88% in triage, placed on 2L [place]. Pt also reports that for past 2 days, HR has been running low. Pt has appt with cardiologist on Thursday. Denies CP, fevers, dizziness.Pt has hx of HTN.

## 2022-10-05 NOTE — ED Provider Notes (Signed)
[facility] Provider Note    Event Date/Time   First MD Initiated Contact with Patient 10/05/22 2252     (approximate)   History   Shortness of Breath   HPI Nichole Hess is a 89 y.o. female whose medical history includes atrial fibrillation on Eliquis and congestive heart failure followed by Dr. Mariah Milling.  Reportedly she has been on amiodarone and Eliquis and has an appointment in a few days for electric cardioversion.  She presents tonight for evaluation of gradually worsening shortness of breath over the last day.  She said she has been compliant with her fluid pill (furosemide 20 mg p.o. twice daily) and has also been taking an extra pill which she was told she could do if her shortness of breath is getting worse.  Despite this, she feels like she has had some leg swelling and over the last 1 to 2 days has been unable to lie flat and had to sleep sitting up in a chair last night.  She monitors her SpO2 at home and said that she got down as low as 82% tonight with significantly increased work of breathing so her daughter insisted that she come to the emergency department.  Upon arrival she was at 88% and was placed on supplemental oxygen and brought right to a room.  The patient denies having any recent chest pain nor abdominal pain.  She is unaware of any fever but said that her nose has been running since she has been to the emergency department.  She denies nausea, vomiting, and dysuria.     Physical Exam   Triage Vital Signs: ED Triage Vitals  Encounter Vitals Group     BP 10/05/22 2228 (!) 90/48     Systolic BP Percentile --      Diastolic BP Percentile --      Pulse Rate 10/05/22 2228 (!) 49     Resp 10/05/22 2228 (!) 24     Temp 10/05/22 2228 (!) 97.5 F (36.4 C)     Temp Source 10/05/22 2228 Oral     SpO2 10/05/22 2228 93 %     Weight --      Height 10/05/22 2222 1.6 m (5\' 3" )     Head Circumference --      Peak Flow --      Pain Score  10/05/22 2222 0     Pain Loc --      Pain Education --      Exclude from Growth Chart --     Most recent vital signs: Vitals:   10/05/22 2228  BP: (!) 90/48  Pulse: (!) 49  Resp: (!) 24  Temp: (!) 97.5 F (36.4 C)  SpO2: 93%    General: Awake, mild to moderate respiratory distress on supplemental oxygen.  Alert and oriented. CV:  Good peripheral perfusion despite bradycardia.  Regular rhythm but slow rate.  Normal heart sounds. Resp:  Increased respiratory effort with accessory muscle usage and intercostal retractions.  Lung sounds are clear with no wheezing. Abd:  No distention.  No tenderness to palpation of the abdomen. Other:  No focal neurological deficits appreciated.   ED Results / Procedures / Treatments   Labs (all labs ordered are listed, but only abnormal results are displayed) Labs Reviewed  CULTURE, BLOOD (ROUTINE X 2)  CULTURE, BLOOD (ROUTINE X 2)  RESP PANEL BY RT-PCR (RSV, FLU A&B, COVID)  RVPGX2  LACTIC ACID, PLASMA  LACTIC ACID, PLASMA  PROTIME-INR  URINALYSIS, W/ REFLEX TO CULTURE (INFECTION SUSPECTED)  BRAIN NATRIURETIC PEPTIDE  COMPREHENSIVE METABOLIC PANEL  CBC WITH DIFFERENTIAL/PLATELET  BLOOD GAS, VENOUS  TROPONIN I (HIGH SENSITIVITY)     EKG  ED ECG REPORT I, Loleta Rose, the attending physician, personally viewed and interpreted this ECG.  Date: 10/05/2022 EKG Time: 22: 27 Rate: 49 Rhythm: Sinus bradycardia with first-degree AV block QRS Axis: Left axis deviation Intervals: LVH with some QRS widening ST/T Wave abnormalities: Non-specific ST segment / T-wave changes, but no clear evidence of acute ischemia. Narrative Interpretation: no definitive evidence of acute ischemia; does not meet STEMI criteria.  Change from prior A-fib on EKG from 09/26/2022    RADIOLOGY I viewed and interpreted the patient's chest x-ray.  There is some interstitial prominence suggestive of edema versus diffuse infection, most likely edema.  Radiology  report confirms.   PROCEDURES:  Critical Care performed: Yes, see critical care procedure note(s)  .1-3 Lead EKG Interpretation  Performed by: Loleta Rose, MD Authorized by: Loleta Rose, MD     Interpretation: abnormal     ECG rate:  49   ECG rate assessment: bradycardic     Rhythm: sinus bradycardia     Ectopy: none     Conduction: normal   .Critical Care  Performed by: Loleta Rose, MD Authorized by: Loleta Rose, MD   Critical care provider statement:    Critical care time (minutes):  45   Critical care time was exclusive of:  Separately billable procedures and treating other patients   Critical care was necessary to treat or prevent imminent or life-threatening deterioration of the following conditions:  Cardiac failure and respiratory failure   Critical care was time spent personally by me on the following activities:  Development of treatment plan with patient or surrogate, evaluation of patient's response to treatment, examination of patient, obtaining history from patient or surrogate, ordering and performing treatments and interventions, ordering and review of laboratory studies, ordering and review of radiographic studies, pulse oximetry, re-evaluation of patient's condition and review of old charts     IMPRESSION / MDM / ASSESSMENT AND PLAN / ED COURSE  I reviewed the triage vital signs and the nursing notes.                              Differential diagnosis includes, but is not limited to, CHF exacerbation, sepsis, ACS or PE, metabolic or electrolyte abnormality, renal failure.  Patient's presentation is most consistent with acute presentation with potential threat to life or bodily function.  Labs/studies ordered: I ordered standard sepsis labs including the following: Respiratory viral panel PCR swab, blood cultures x1, pro time-INR (needed given the patient's history of A-fib on Eliquis), CMP, urinalysis, urine culture, lactic acid, CBC with  differential, high-sensitivity troponin, BNP  Interventions/Medications given:  Medications - No data to display  (Note:  hospital course my include additional interventions and/or labs/studies not listed above.)   Patient is awake and alert but in mild to moderate respiratory distress and requiring supplemental oxygen which is not typical for her.  Her vitals are notable for borderline hypotension as well as bradycardia with a heart rate consistently of 49.  EKG shows sinus rhythm and I suspect that the heart rate is a result of her recent treatment with amiodarone which may also explain her hypotension.  However she may also be volume depleted in the setting of increased diuresis as her shortness of breath is gotten  worse over the last couple of days.  Difficult mixed picture; I will hold off on fluid resuscitation now and I think it is less likely to be sepsis, but I am working up as a "possible sepsis" since she could have an infectious process such as community-acquired pneumonia which is leading to her worsening symptoms as well.  The patient is on the cardiac monitor to evaluate for evidence of arrhythmia and/or significant heart rate changes.  Holding off on emergent medication intervention at this time until the picture is more clear.   Clinical Course as of 10/06/22 4098  Wynelle Link Oct 05, 2022  2346 Labs notable for acute renal failure with creatinine of 2.28; her baseline seems to be around 1.2.  Her BNP is substantially elevated at nearly 1300.  CBC is normal.  This seems to be a mixed picture of acute kidney injury/acute renal failure with interstitial edema and CHF exacerbation.   [CF]  2349 Will treat with furosemide 40 mg IV initially to begin more aggressive diuresis given the patient's respiratory failure with hypoxia and interstitial edema.  Consulting the hospitalist for further management. [CF]  Mon Oct 06, 2022  0026 Consulted with Dr. Para March with the hospitalist service.   She expressed understandable concern that the patient may be developing or working her way towards cardiogenic shock given the mixed picture as previously described.  Dr. Para March attempted to reach out to Dr. Mayford Knife with cardiology [facility]) but was unable to get in touch with Dr. Mayford Knife.  Will also consult the PCCM service [CF]  [phone] Patient's blood pressure remains low but stable with a MAP between 65 and 73, most recent blood pressure was 96/52.  I consulted Webb Silversmith with the PCCM team and we discussed the case.  She will admit the patient. [CF]    Clinical Course User Index [CF] Loleta Rose, MD     FINAL CLINICAL IMPRESSION(S) / ED DIAGNOSES   Final diagnoses:  Acute on chronic congestive heart failure, unspecified heart failure type (HCC)  Acute pulmonary edema (HCC)  Acute respiratory failure with hypoxia (HCC)  Acute renal failure, unspecified acute renal failure type (HCC)  Elevated troponin level  Sinus bradycardia     Rx / DC Orders   ED Discharge Orders     None        Note:  This document was prepared using Dragon voice recognition software and may include unintentional dictation errors.   Loleta Rose, MD 10/06/22 [id number]

## 2022-10-06 ENCOUNTER — Other Ambulatory Visit: Payer: Self-pay

## 2022-10-06 ENCOUNTER — Inpatient Hospital Stay: Payer: Medicare HMO

## 2022-10-06 ENCOUNTER — Inpatient Hospital Stay (HOSPITAL_COMMUNITY)
Admit: 2022-10-06 | Discharge: 2022-10-06 | Disposition: A | Discharge status: Home / Self Care | Payer: Medicare HMO | Attending: Physician Assistant | Admitting: Physician Assistant

## 2022-10-06 DIAGNOSIS — Z79890 Hormone replacement therapy (postmenopausal): Secondary | ICD-10-CM | POA: Diagnosis not present

## 2022-10-06 DIAGNOSIS — B9620 Unspecified Escherichia coli [E. coli] as the cause of diseases classified elsewhere: Secondary | ICD-10-CM | POA: Diagnosis present

## 2022-10-06 DIAGNOSIS — J810 Acute pulmonary edema: Secondary | ICD-10-CM | POA: Diagnosis present

## 2022-10-06 DIAGNOSIS — E039 Hypothyroidism, unspecified: Secondary | ICD-10-CM | POA: Diagnosis present

## 2022-10-06 DIAGNOSIS — J9621 Acute and chronic respiratory failure with hypoxia: Secondary | ICD-10-CM | POA: Diagnosis present

## 2022-10-06 DIAGNOSIS — Z923 Personal history of irradiation: Secondary | ICD-10-CM | POA: Diagnosis not present

## 2022-10-06 DIAGNOSIS — I5031 Acute diastolic (congestive) heart failure: Secondary | ICD-10-CM | POA: Diagnosis not present

## 2022-10-06 DIAGNOSIS — J9811 Atelectasis: Secondary | ICD-10-CM | POA: Diagnosis present

## 2022-10-06 DIAGNOSIS — I5043 Acute on chronic combined systolic (congestive) and diastolic (congestive) heart failure: Secondary | ICD-10-CM | POA: Diagnosis present

## 2022-10-06 DIAGNOSIS — E785 Hyperlipidemia, unspecified: Secondary | ICD-10-CM | POA: Diagnosis present

## 2022-10-06 DIAGNOSIS — J9601 Acute respiratory failure with hypoxia: Secondary | ICD-10-CM | POA: Diagnosis present

## 2022-10-06 DIAGNOSIS — N179 Acute kidney failure, unspecified: Secondary | ICD-10-CM | POA: Diagnosis present

## 2022-10-06 DIAGNOSIS — I081 Rheumatic disorders of both mitral and tricuspid valves: Secondary | ICD-10-CM | POA: Diagnosis present

## 2022-10-06 DIAGNOSIS — E872 Acidosis, unspecified: Secondary | ICD-10-CM | POA: Diagnosis present

## 2022-10-06 DIAGNOSIS — R001 Bradycardia, unspecified: Secondary | ICD-10-CM | POA: Diagnosis present

## 2022-10-06 DIAGNOSIS — N1831 Chronic kidney disease, stage 3a: Secondary | ICD-10-CM | POA: Diagnosis present

## 2022-10-06 DIAGNOSIS — I4819 Other persistent atrial fibrillation: Secondary | ICD-10-CM

## 2022-10-06 DIAGNOSIS — Z7189 Other specified counseling: Secondary | ICD-10-CM | POA: Diagnosis not present

## 2022-10-06 DIAGNOSIS — Z87891 Personal history of nicotine dependence: Secondary | ICD-10-CM | POA: Diagnosis not present

## 2022-10-06 DIAGNOSIS — I340 Nonrheumatic mitral (valve) insufficiency: Secondary | ICD-10-CM

## 2022-10-06 DIAGNOSIS — Z8249 Family history of ischemic heart disease and other diseases of the circulatory system: Secondary | ICD-10-CM | POA: Diagnosis not present

## 2022-10-06 DIAGNOSIS — I5023 Acute on chronic systolic (congestive) heart failure: Secondary | ICD-10-CM

## 2022-10-06 DIAGNOSIS — Z7901 Long term (current) use of anticoagulants: Secondary | ICD-10-CM | POA: Diagnosis not present

## 2022-10-06 DIAGNOSIS — I5033 Acute on chronic diastolic (congestive) heart failure: Secondary | ICD-10-CM | POA: Diagnosis not present

## 2022-10-06 DIAGNOSIS — Z79899 Other long term (current) drug therapy: Secondary | ICD-10-CM | POA: Diagnosis not present

## 2022-10-06 DIAGNOSIS — N390 Urinary tract infection, site not specified: Secondary | ICD-10-CM | POA: Diagnosis present

## 2022-10-06 DIAGNOSIS — J449 Chronic obstructive pulmonary disease, unspecified: Secondary | ICD-10-CM | POA: Diagnosis present

## 2022-10-06 DIAGNOSIS — Z1152 Encounter for screening for COVID-19: Secondary | ICD-10-CM | POA: Diagnosis not present

## 2022-10-06 DIAGNOSIS — E440 Moderate protein-calorie malnutrition: Secondary | ICD-10-CM | POA: Diagnosis present

## 2022-10-06 DIAGNOSIS — R570 Cardiogenic shock: Secondary | ICD-10-CM | POA: Diagnosis present

## 2022-10-06 DIAGNOSIS — I130 Hypertensive heart and chronic kidney disease with heart failure and stage 1 through stage 4 chronic kidney disease, or unspecified chronic kidney disease: Secondary | ICD-10-CM | POA: Diagnosis present

## 2022-10-06 LAB — BLOOD GAS, ARTERIAL
Acid-base deficit: 1.1 mmol/L (ref 0.0–2.0)
Bicarbonate: 24.3 mmol/L (ref 20.0–28.0)
FIO2: 55 %
MECHVT: 450 mL
Mechanical Rate: 18
O2 Saturation: 79.8 %
PEEP: 5 cmH2O
Patient temperature: 37
pCO2 arterial: 42 mm[Hg] (ref 32–48)
pH, Arterial: 7.37 (ref 7.35–7.45)
pO2, Arterial: 51 mm[Hg] — ABNORMAL LOW (ref 83–108)

## 2022-10-06 LAB — GLUCOSE, CAPILLARY
Glucose-Capillary: 127 mg/dL — ABNORMAL HIGH (ref 70–99)
Glucose-Capillary: 133 mg/dL — ABNORMAL HIGH (ref 70–99)
Glucose-Capillary: 147 mg/dL — ABNORMAL HIGH (ref 70–99)
Glucose-Capillary: 149 mg/dL — ABNORMAL HIGH (ref 70–99)
Glucose-Capillary: 152 mg/dL — ABNORMAL HIGH (ref 70–99)
Glucose-Capillary: 164 mg/dL — ABNORMAL HIGH (ref 70–99)

## 2022-10-06 LAB — APTT
aPTT: 38 s — ABNORMAL HIGH (ref 24–36)
aPTT: >200 s (ref 24–36)

## 2022-10-06 LAB — TROPONIN I (HIGH SENSITIVITY): Troponin I (High Sensitivity): 32 ng/L — ABNORMAL HIGH (ref <18)

## 2022-10-06 LAB — MRSA NEXT GEN BY PCR, NASAL: MRSA by PCR Next Gen: NOT DETECTED

## 2022-10-06 LAB — PROCALCITONIN: Procalcitonin: <0.1 ng/mL

## 2022-10-06 LAB — RESPIRATORY PANEL BY PCR

## 2022-10-06 LAB — CBC
HCT: 45 % (ref 36.0–46.0)
Hemoglobin: 14.2 g/dL (ref 12.0–15.0)
MCH: 30.1 pg (ref 26.0–34.0)
MCHC: 31.6 g/dL (ref 30.0–36.0)
MCV: 95.5 fL (ref 80.0–100.0)
Platelets: 258 10*3/uL (ref 150–400)
RBC: 4.71 MIL/uL (ref 3.87–5.11)
RDW: 14.9 % (ref 11.5–15.5)
WBC: 12.6 10*3/uL — ABNORMAL HIGH (ref 4.0–10.5)
nRBC: 0 % (ref 0.0–0.2)

## 2022-10-06 LAB — BASIC METABOLIC PANEL
Anion gap: 12 (ref 5–15)
BUN: 32 mg/dL — ABNORMAL HIGH (ref 8–23)
CO2: 23 mmol/L (ref 22–32)
Calcium: 9.2 mg/dL (ref 8.9–10.3)
Chloride: 98 mmol/L (ref 98–111)
Creatinine, Ser: 2.38 mg/dL — ABNORMAL HIGH (ref 0.44–1.00)
GFR, Estimated: 19 mL/min — ABNORMAL LOW (ref >60)
Glucose, Bld: 135 mg/dL — ABNORMAL HIGH (ref 70–99)
Potassium: 5.1 mmol/L (ref 3.5–5.1)
Sodium: 133 mmol/L — ABNORMAL LOW (ref 135–145)

## 2022-10-06 LAB — URINALYSIS, W/ REFLEX TO CULTURE (INFECTION SUSPECTED)
Bilirubin Urine: NEGATIVE
Glucose, UA: NEGATIVE mg/dL
Ketones, ur: NEGATIVE mg/dL
Nitrite: NEGATIVE
Protein, ur: NEGATIVE mg/dL
RBC / HPF: >50 RBC/hpf (ref 0–5)
Specific Gravity, Urine: 1.006 (ref 1.005–1.030)
WBC, UA: >50 WBC/hpf (ref 0–5)
pH: 5 (ref 5.0–8.0)

## 2022-10-06 LAB — D-DIMER, QUANTITATIVE: D-Dimer, Quant: 1.71 ug{FEU}/mL — ABNORMAL HIGH (ref 0.00–0.50)

## 2022-10-06 LAB — TSH: TSH: 11.33 u[IU]/mL — ABNORMAL HIGH (ref 0.350–4.500)

## 2022-10-06 LAB — RESP PANEL BY RT-PCR (RSV, FLU A&B, COVID)  RVPGX2
Influenza A by PCR: NEGATIVE
Influenza B by PCR: NEGATIVE
Resp Syncytial Virus by PCR: NEGATIVE
SARS Coronavirus 2 by RT PCR: NEGATIVE

## 2022-10-06 LAB — LACTIC ACID, PLASMA: Lactic Acid, Venous: 2.3 mmol/L (ref 0.5–1.9)

## 2022-10-06 LAB — PROTIME-INR
INR: 2.7 — ABNORMAL HIGH (ref 0.8–1.2)
Prothrombin Time: 28.8 s — ABNORMAL HIGH (ref 11.4–15.2)

## 2022-10-06 LAB — STREP PNEUMONIAE URINARY ANTIGEN: Strep Pneumo Urinary Antigen: NEGATIVE

## 2022-10-06 LAB — T4, FREE: Free T4: 1.74 ng/dL — ABNORMAL HIGH (ref 0.61–1.12)

## 2022-10-06 MED ORDER — FAMOTIDINE 20 MG PO TABS
20.0000 mg | ORAL_TABLET | Freq: Every day | ORAL | Status: DC
  Administered 2022-10-06: 20 mg
  Filled 2022-10-06: qty 1

## 2022-10-06 MED ORDER — FENTANYL 2500MCG IN NS 250ML (10MCG/ML) PREMIX INFUSION
25.0000 ug/h | INTRAVENOUS | Status: DC
  Administered 2022-10-06: 50 ug/h via INTRAVENOUS
  Filled 2022-10-06: qty 250

## 2022-10-06 MED ORDER — IPRATROPIUM-ALBUTEROL 0.5-2.5 (3) MG/3ML IN SOLN: 3.0000 mL | RESPIRATORY_TRACT | Status: DC | PRN

## 2022-10-06 MED ORDER — FENTANYL BOLUS VIA INFUSION: 25.0000 ug | INTRAVENOUS | Status: DC | PRN

## 2022-10-06 MED ORDER — SODIUM CHLORIDE 0.9 % IV SOLN
250.0000 mL | INTRAVENOUS | Status: DC
  Administered 2022-10-06 – 2022-10-07 (×2): 250 mL via INTRAVENOUS

## 2022-10-06 MED ORDER — ATROPINE SULFATE 1 MG/10ML IJ SOSY: 1.0000 mg | PREFILLED_SYRINGE | INTRAMUSCULAR | Status: DC | PRN

## 2022-10-06 MED ORDER — HEPARIN (PORCINE) 25000 UT/250ML-% IV SOLN
700.0000 [IU]/h | INTRAVENOUS | Status: AC
  Administered 2022-10-08: 700 [IU]/h via INTRAVENOUS
  Filled 2022-10-06: qty 250

## 2022-10-06 MED ORDER — ORAL CARE MOUTH RINSE
15.0000 mL | OROMUCOSAL | Status: DC
  Administered 2022-10-06 – 2022-10-07 (×16): 15 mL via OROMUCOSAL

## 2022-10-06 MED ORDER — MORPHINE SULFATE (PF) 2 MG/ML IV SOLN
2.0000 mg | INTRAVENOUS | Status: DC | PRN
  Administered 2022-10-06: 2 mg via INTRAVENOUS

## 2022-10-06 MED ORDER — NOREPINEPHRINE 4 MG/250ML-% IV SOLN
2.0000 ug/min | INTRAVENOUS | Status: DC
  Administered 2022-10-06: 2 ug/min via INTRAVENOUS
  Administered 2022-10-06: 3 ug/min via INTRAVENOUS
  Filled 2022-10-06 (×2): qty 250

## 2022-10-06 MED ORDER — HEPARIN (PORCINE) 25000 UT/250ML-% IV SOLN
900.0000 [IU]/h | INTRAVENOUS | Status: DC
  Administered 2022-10-06: 900 [IU]/h via INTRAVENOUS
  Filled 2022-10-06: qty 250

## 2022-10-06 MED ORDER — IPRATROPIUM-ALBUTEROL 0.5-2.5 (3) MG/3ML IN SOLN
3.0000 mL | Freq: Four times a day (QID) | RESPIRATORY_TRACT | Status: DC
  Administered 2022-10-06: 3 mL via RESPIRATORY_TRACT

## 2022-10-06 MED ORDER — DOCUSATE SODIUM 100 MG PO CAPS: 100.0000 mg | ORAL_CAPSULE | Freq: Two times a day (BID) | ORAL | Status: DC | PRN

## 2022-10-06 MED ORDER — MORPHINE SULFATE (PF) 2 MG/ML IV SOLN
INTRAVENOUS | Status: AC
  Filled 2022-10-06: qty 1

## 2022-10-06 MED ORDER — DOCUSATE SODIUM 50 MG/5ML PO LIQD
100.0000 mg | Freq: Two times a day (BID) | ORAL | Status: DC
  Administered 2022-10-06 (×2): 100 mg
  Filled 2022-10-06 (×2): qty 10

## 2022-10-06 MED ORDER — IPRATROPIUM-ALBUTEROL 0.5-2.5 (3) MG/3ML IN SOLN
3.0000 mL | Freq: Four times a day (QID) | RESPIRATORY_TRACT | Status: DC
  Administered 2022-10-06 – 2022-10-07 (×4): 3 mL via RESPIRATORY_TRACT
  Filled 2022-10-06 (×5): qty 3

## 2022-10-06 MED ORDER — CHLORHEXIDINE GLUCONATE CLOTH 2 % EX PADS
6.0000 | MEDICATED_PAD | Freq: Every day | CUTANEOUS | Status: DC
  Administered 2022-10-07 – 2022-10-10 (×4): 6 via TOPICAL

## 2022-10-06 MED ORDER — VITAL AF 1.2 CAL PO LIQD
1000.0000 mL | ORAL | Status: DC
  Administered 2022-10-06: 1000 mL

## 2022-10-06 MED ORDER — FREE WATER
30.0000 mL | Status: DC
  Administered 2022-10-06 – 2022-10-07 (×4): 30 mL

## 2022-10-06 MED ORDER — DOBUTAMINE-DEXTROSE 4-5 MG/ML-% IV SOLN
1.0000 ug/kg/min | INTRAVENOUS | Status: DC
  Administered 2022-10-06: 5 ug/kg/min via INTRAVENOUS
  Filled 2022-10-06: qty 250

## 2022-10-06 MED ORDER — HYDROCORTISONE SOD SUC (PF) 100 MG IJ SOLR
100.0000 mg | Freq: Once | INTRAMUSCULAR | Status: AC
  Administered 2022-10-06: 100 mg via INTRAVENOUS

## 2022-10-06 MED ORDER — LEVOTHYROXINE SODIUM 100 MCG/5ML IV SOLN
75.0000 ug | Freq: Once | INTRAVENOUS | Status: AC
  Administered 2022-10-06: 75 ug via INTRAVENOUS
  Filled 2022-10-06: qty 5

## 2022-10-06 MED ORDER — HYDROCORTISONE SOD SUC (PF) 100 MG IJ SOLR
INTRAMUSCULAR | Status: AC
  Filled 2022-10-06: qty 2

## 2022-10-06 MED ORDER — ATROPINE SULFATE 1 MG/10ML IJ SOSY
PREFILLED_SYRINGE | INTRAMUSCULAR | Status: AC
  Filled 2022-10-06: qty 10

## 2022-10-06 MED ORDER — HYDROCORTISONE SOD SUC (PF) 100 MG IJ SOLR
50.0000 mg | Freq: Three times a day (TID) | INTRAMUSCULAR | Status: DC
  Administered 2022-10-06 – 2022-10-08 (×6): 50 mg via INTRAVENOUS
  Filled 2022-10-06 (×6): qty 2

## 2022-10-06 MED ORDER — FUROSEMIDE 10 MG/ML IJ SOLN
80.0000 mg | Freq: Once | INTRAMUSCULAR | Status: AC
  Administered 2022-10-06: 80 mg via INTRAVENOUS
  Filled 2022-10-06: qty 8

## 2022-10-06 MED ORDER — ROCURONIUM BROMIDE 10 MG/ML (PF) SYRINGE
100.0000 mg | PREFILLED_SYRINGE | Freq: Once | INTRAVENOUS | Status: AC
  Administered 2022-10-06: 100 mg via INTRAVENOUS

## 2022-10-06 MED ORDER — ROCURONIUM BROMIDE 50 MG/5ML IV SOLN
80.0000 mg | Freq: Once | INTRAVENOUS | Status: DC
Start: 2022-10-06 — End: 2022-10-06

## 2022-10-06 MED ORDER — SODIUM BICARBONATE 8.4 % IV SOLN
INTRAVENOUS | Status: AC
  Filled 2022-10-06: qty 50

## 2022-10-06 MED ORDER — HYDROCORTISONE SOD SUC (PF) 100 MG IJ SOLR: 50.0000 mg | Freq: Three times a day (TID) | INTRAMUSCULAR | Status: DC

## 2022-10-06 MED ORDER — ETOMIDATE 2 MG/ML IV SOLN
10.0000 mg | Freq: Once | INTRAVENOUS | Status: AC
  Administered 2022-10-06: 10 mg via INTRAVENOUS

## 2022-10-06 MED ORDER — POLYETHYLENE GLYCOL 3350 17 G PO PACK: 17.0000 g | PACK | Freq: Every day | ORAL | Status: DC | PRN

## 2022-10-06 MED ORDER — POLYETHYLENE GLYCOL 3350 17 G PO PACK
17.0000 g | PACK | Freq: Every day | ORAL | Status: DC
  Administered 2022-10-06: 17 g
  Filled 2022-10-06: qty 1

## 2022-10-06 MED ORDER — FENTANYL CITRATE PF 50 MCG/ML IJ SOSY: 100.0000 ug | PREFILLED_SYRINGE | Freq: Once | INTRAMUSCULAR | Status: DC

## 2022-10-06 MED ORDER — HYDROCORTISONE SOD SUC (PF) 100 MG IJ SOLR: 100.0000 mg | Freq: Two times a day (BID) | INTRAMUSCULAR | Status: DC

## 2022-10-06 MED ORDER — HEPARIN BOLUS VIA INFUSION
2000.0000 [IU] | Freq: Once | INTRAVENOUS | Status: DC
  Filled 2022-10-06: qty 2000

## 2022-10-06 MED ORDER — ORAL CARE MOUTH RINSE: 15.0000 mL | OROMUCOSAL | Status: DC | PRN

## 2022-10-06 NOTE — Hospital Course (Signed)
Atrial fibrillation on Eliquis, systolic CHF secondary to dilated cardiomyopathy with severe TR and MR (EF 45 to 50% 08/2022), HTN, CKD 3a, hypothyroidism and prediabetes, hospitalized from 8/20 -  08/29/2022 with new onset CHF and A-fib presenting with hypoxic respiratory failure, improved with treatment weaned off O2 prior to discharge who presents to the ED with ongoing shortness of breath that acutely worsened as well as concerned for low heart rate in the 40s for the past couple days.  Patient was evaluatedIn the ED a couple weeks prior on 9/17 with shortness of breath and fluid retention in spite of compliance with treatment.  She was mildly hypoxic at the time.  She was offered admission but declined, opting to follow-up with her cardiologist later that week.  Follow-up with cardiology on 9/20 she was noted to be tachycardic and she was started on amiodarone 400 mg twice daily for 7 days to titrate down to 200 twice daily with plans for cardioversion in 2 weeks.  Her Lasix was also increased to 40 mg daily she was continued on her metoprolol 25 twice daily. On the night of arrival she reports worsening shortness of breath over the last 1 day that did not improve with taking an extra dose of Lasix.  States she has been sleeping in a recliner for the last couple days due to shortness of breath on lying flat, with lower extremity edema and her O2 sats have been as low as 82 and her pulse has been as low as in the 40s. She denies chest pain, cough, fever or chills. ED course and data reviewed: BP 90/48 and 88/65 with pulse 47-49, tachypneic 24-34 with O2 sats 92-93 on 2 L, initially 88% in triage. Labs: Troponin 32 with BNP 1298 CBC WNL, CMP notable for creatinine of 2.28 up from baseline of 1.08 a month prior.  Sodium 132 Respiratory viral panel negative Lactic acid 1.7. EKG, personally viewed and interpreted showing sinus bradycardia with first-degree AV block at a rate of 49, LVH Chest x-ray shows small  bilateral effusions with basilar atelectasis.  Heart mildly enlarged with interstitial prominence which may reflect interstitial edema. Hospitalist consulted for admission.

## 2022-10-06 NOTE — Progress Notes (Addendum)
Patient arrived in the unit on BiPAP. She appeared more lethargic with increased work of breathing from prior assessment. She was hypotensive and bradycardic in the high 30's. Due to persistent symptomatic bradycardia with high risk for decompensation, patient received atropine with no improvement. She was placed on the zoll monitor with pacer pads on in preparation for pacing should she decompensate. Attempted to reach on call [facility] cardiologist Dr. Mayford Knife 3 times for further input but was unable to get in touch or receive call back. Due to worsening bradycardia and impending respiratory arrest, patient received push dose epi, sodium bicarb for BP and HR stabilization prior to intubation. Patient was successfully intubated without incident, she maintained HR in the upper 40's and MAP>65 on Levo and push dose epi which was stopped once patient's HR stabilized.   Webb Silversmith, DNP, CCRN, FNP-C, AGACNP-BC Acute Care & Family Nurse Practitioner  [facility]  See Amion for personal pager PCCM on call pager [phone] until 7 am

## 2022-10-06 NOTE — Consult Note (Signed)
ANTICOAGULATION CONSULT NOTE  Pharmacy Consult for Heparin Indication: atrial fibrillation  No Known Allergies  Patient Measurements: Height: 5\' 3"  (160 cm) Weight: 73.4 kg (161 lb 13.1 oz) IBW/kg (Calculated) : 52.4 Heparin Dosing Weight: 67.9 kg  Vital Signs: Temp: 97.1 F (36.2 C) (09/30 1230) Temp Source: Axillary (09/30 1230) BP: 126/58 (09/30 1245) Pulse Rate: 46 (09/30 1245)  Labs: Recent Labs    10/05/22 2257 10/06/22 0106 10/06/22 0516 10/06/22 1111  HGB 14.4  --  14.2  --   HCT 45.0  --  45.0  --   PLT 263  --  258  --   APTT  --  38*  --   --   LABPROT 25.2*  --   --  28.8*  INR 2.3*  --   --  2.7*  CREATININE 2.28*  --  2.38*  --   TROPONINIHS 32* 32*  --   --     Estimated Creatinine Clearance: 16 mL/min (A) (by C-G formula based on SCr of 2.38 mg/dL (H)).   Medical History: Past Medical History:  Diagnosis Date   Breast cancer (HCC) 06/05/1999   lumpectomy-positive   Hypertension    Personal history of radiation therapy    Thyroid disease     Pertinent Medications:  Apixaban 5mg  BID, last dose: 9/29@2000   Assessment: 89 y.o female with significant PMH of HTN, Ao atherosclerosis, hypothyroidism, and CKD III, HFmrEF, Atrial Fibrillation on Eliquis, and HLD who presented to the ED with chief complaints of progressive shortness of breath. Pharmacy has been consulted to initiate and monitor heparin infusion.  Baseline labs: aPTT 38sec, INR 2.7, Hgb 14.2, Plts 258  Goal of Therapy:  Heparin level 0.3-0.7 units/ml aPTT 66-102 seconds Monitor platelets by anticoagulation protocol: Yes   Plan:  Given elevated INR and high risk for GIB, will avoid boluses Start heparin infusion at 900 units/hr Check aPTT level in 8 hours and daily while on heparin Will use aPTT to guide dosing until HL correlates, then will transition to HL dosing Continue to monitor H&H and platelets  Dock Baccam A Delois Silvester 10/06/2022,12:59 PM

## 2022-10-06 NOTE — Plan of Care (Signed)
  Problem: Clinical Measurements: Goal: Respiratory complications will improve Outcome: Progressing Goal: Cardiovascular complication will be avoided Outcome: Progressing   Problem: Nutrition: Goal: Adequate nutrition will be maintained Outcome: Progressing   

## 2022-10-06 NOTE — ED Notes (Signed)
Patient assisted to bedside commode by RN. Patient sat onside of bed for 5 minutes d/t becoming more SOB and tachypneic. Patient instructed to take slow deep breaths and assisted feet onto bed.

## 2022-10-06 NOTE — Progress Notes (Addendum)
Initial Nutrition Assessment  DOCUMENTATION CODES:   Non-severe (moderate) malnutrition in context of chronic illness  INTERVENTION:   Vital 1.2@50ml /hr- Initiate at 11ml/hr and increase by 72ml/hr q 8 hours until goal rate is reached.   Free water flushes 30ml q4 hours to maintain tube patency   Regimen provides 1440kcal/day, 90g/day protein and 1151ml/day of free water.   Pt at high refeed risk; recommend monitor potassium, magnesium and phosphorus labs daily until stable  Daily weights   NUTRITION DIAGNOSIS:   Moderate Malnutrition related to chronic illness as evidenced by moderate fat depletion, moderate muscle depletion.  GOAL:   Provide needs based on ASPEN/SCCM guidelines  MONITOR:   Vent status, Labs, Weight trends, TF tolerance, I & O's, Skin  REASON FOR ASSESSMENT:   Ventilator    ASSESSMENT:   89 y/o female with h/o HTN, CKD III, breast cancer s/p lumpectomy, hypothyroidism, HLD, trigeminal neuralgia, trigeminal neuralgia and newly diagnosed afib who is admitted with AKI, bradycardia, CHF and cardiogenic shock.  Pt sedated and ventilated. OGT in place. Plan is to initiate tube feeds today. Pt is at high refeed risk. Per chart, pt is up ~10lbs from her UBW. Pt noted to have significant edema in her BLE. Pt with minimal urine output. Pt is receiving lasix.   Medications reviewed and include: colace, solu-cortef, miralax, heparin, levophed  Labs reviewed: Na 133(L), K 5.1 wnl, BUN 32(H), creat 2.38(H) BNP- 1298.0(H)- 9/29 Wbc- 12.6(H) Cbgs- 164, 133 x 24 hrs  AIC 5.6- 8/21  Patient is currently intubated on ventilator support MV: 8.9 L/min Temp (24hrs), Avg:97.1 F (36.2 C), Min:94.8 F (34.9 C), Max:98.7 F (37.1 C)  Propofol: none   MAP- >68mmHg   UOP- 12ml   NUTRITION - FOCUSED PHYSICAL EXAM:  Flowsheet Row Most Recent Value  Orbital Region Severe depletion  Upper Arm Region Severe depletion  Thoracic and Lumbar Region Moderate depletion   Buccal Region Moderate depletion  Temple Region Moderate depletion  Clavicle Bone Region Moderate depletion  Clavicle and Acromion Bone Region Moderate depletion  Scapular Bone Region Moderate depletion  Dorsal Hand Unable to assess  Patellar Region Moderate depletion  Anterior Thigh Region Moderate depletion  Posterior Calf Region Moderate depletion  Edema (RD Assessment) Moderate  Hair Reviewed  Eyes Reviewed  Mouth Reviewed  Skin Reviewed  Nails Reviewed   Diet Order:   Diet Order             Diet NPO time specified  Diet effective now                  EDUCATION NEEDS:   No education needs have been identified at this time  Skin:  Skin Assessment: Reviewed RN Assessment (ecchymosis)  Last BM:  pta  Height:   Ht Readings from Last 1 Encounters:  10/06/22 5\' 3"  (1.6 m)    Weight:   Wt Readings from Last 1 Encounters:  10/06/22 73.4 kg    Ideal Body Weight:  52.2 kg  BMI:  Body mass index is 28.66 kg/m.  Estimated Nutritional Needs:   Kcal:  1315kcal/day  Protein:  90-105g/day  Fluid:  1.3-1.5L/day  Betsey Holiday MS, RD, LDN Please refer to [facility] for RD and/or RD on-call/weekend/after hours pager

## 2022-10-06 NOTE — Consult Note (Signed)
ANTICOAGULATION CONSULT NOTE  Pharmacy Consult for Heparin Indication: atrial fibrillation  No Known Allergies  Patient Measurements: Height: 5\' 3"  (160 cm) Weight: 73.4 kg (161 lb 13.1 oz) IBW/kg (Calculated) : 52.4 Heparin Dosing Weight: 67.9 kg  Vital Signs: Temp: 99 F (37.2 C) (09/30 1900) Temp Source: Axillary (09/30 1900) BP: 118/60 (09/30 2215) Pulse Rate: 47 (09/30 2215)  Labs: Recent Labs    10/05/22 2257 10/06/22 0106 10/06/22 0516 10/06/22 1111 10/06/22 2049  HGB 14.4  --  14.2  --   --   HCT 45.0  --  45.0  --   --   PLT 263  --  258  --   --   APTT  --  38*  --   --  >200*  LABPROT 25.2*  --   --  28.8*  --   INR 2.3*  --   --  2.7*  --   CREATININE 2.28*  --  2.38*  --   --   TROPONINIHS 32* 32*  --   --   --     Estimated Creatinine Clearance: 16 mL/min (A) (by C-G formula based on SCr of 2.38 mg/dL (H)).   Medical History: Past Medical History:  Diagnosis Date   Breast cancer (HCC) 06/05/1999   lumpectomy-positive   Hypertension    Personal history of radiation therapy    Thyroid disease     Pertinent Medications:  Apixaban 5mg  BID, last dose: 9/29@2000   Assessment: 89 y.o female with significant PMH of HTN, Ao atherosclerosis, hypothyroidism, and CKD III, HFmrEF, Atrial Fibrillation on Eliquis, and HLD who presented to the ED with chief complaints of progressive shortness of breath. Pharmacy has been consulted to initiate and monitor heparin infusion.  Baseline labs: aPTT 38sec, INR 2.7, Hgb 14.2, Plts 258  Goal of Therapy:  Heparin level 0.3-0.7 units/ml aPTT 66-102 seconds Monitor platelets by anticoagulation protocol: Yes  09/30 2049 aPTT > 200   Plan: Contacted RN.  Hold infusion for 1 hour. Given elevated INR and high risk for GIB, will avoid boluses Restart heparin infusion at 700 units/hr Check aPTT level in 8 hours after restart Will use aPTT to guide dosing until HL correlates, then will transition to HL  dosing Continue to monitor H&H and platelets  Otelia Sergeant, PharmD, [facility] 10/06/2022 10:50 PM\

## 2022-10-06 NOTE — Progress Notes (Signed)
eLink Physician-Brief Progress Note Patient Name: Nichole Hess DOB: [birth year] MRN: [id number]   Date of Service  10/06/2022  HPI/Events of Note  89/F with history of CHF, afib, CKD 3, with acute worsening of shortness of breath. CXR with hyperinflated lung fields, also with findings conssitent with pulmonary edema. She was placed on BIPAP and admitted to the ICU.   BP 92/40 (MAP 57, HR 43, RR 28, SpO2 96% on BIPAP.  BIPAP 14/8 FiO2 100% -TV 420-500   Labs WBC 8.1, Hgb 14.4, plt 263 Na 132, K 4.8, CHO 26, BUN 65m Cr 2.28 (baseline 1-1.3) Influenza, RSV, COVID negative CXR - small bilateral effusions, interstitial edema, hyperinflated lung fields.   eICU Interventions  CHF exacerbation AKI on CKD Acute respiratory failure Shock, undifferentiated.  Bradycardia  - PT on BIPAP, but now hemodynamically unstable. Team at bedside, getting ready to intubate.     - Will maintain on low tidal volume ventilation strategy (TV 4-64ml/kg PBW), target plateau pressures <30     - Titrate FiO2, PEEP, to target SpO2 >90%     - Serial ABG, will make further vent changes as appropriate     - Would be cautious with sedation as BP already downtrending.  - Hold off of diuresis for now. Continue to monitor I/Os, daily weights.  - Will plan to start on levophed. Titrate to target SBP >90.  - If with worsening bradycardia, consider PRN atropine vs. adding epinephrine vs. Dopamine to help bring up HR.  - Hold Beta blockers for now.  - Plan for echo in AM. Trend troponin, serial EKG.  - Pt without known history of COPD. But flattened diaphragms on CXR. Will place on bronchodilators for now. Low threshold to start empiric steroids, antibiotics.          Macy Polio M DELA CRUZ 10/06/2022, 3:08 AM

## 2022-10-06 NOTE — Consult Note (Signed)
PHARMACY CONSULT NOTE - ELECTROLYTES  Pharmacy Consult for Electrolyte Monitoring and Replacement   Recent Labs: Height: 5\' 3"  (160 cm) Weight: 73.4 kg (161 lb 13.1 oz) IBW/kg (Calculated) : 52.4 Estimated Creatinine Clearance: 16 mL/min (A) (by C-G formula based on SCr of 2.38 mg/dL (H)). Potassium (mmol/L)  Date Value  10/06/2022 5.1  06/06/2011 4.0   Magnesium (mg/dL)  Date Value  16/10/9602 2.0   Calcium (mg/dL)  Date Value  54/09/8117 9.2   Calcium, Total (mg/dL)  Date Value  14/78/2956 9.3   Albumin (g/dL)  Date Value  21/30/8657 3.8  06/06/2011 3.6   Sodium (mmol/L)  Date Value  10/06/2022 133 (L)  09/26/2022 134  06/06/2011 138    Assessment  Nichole Hess is a 89 y.o. female presenting with shortness of breath. PMH significant for HTN, Ao atherosclerosis, hypothyroidism, and CKD III, HFmrEF, Atrial Fibrillation on Eliquis, and HLD  . Pharmacy has been consulted to monitor and replace electrolytes.  Diet: NPO Pertinent medications: Furosemide 40mg  IV x 1  Goal of Therapy: Electrolytes WNL  Plan:  No replacement indicated Check BMP, Mg, Phos with AM labs  Thank you for allowing pharmacy to be a part of this patient's care.  Bettey Costa, PharmD Clinical Pharmacist 10/06/2022 7:53 AM

## 2022-10-06 NOTE — Procedures (Signed)
Intubation Procedure Note  JOHNA KEARL  [id number]  01-15-1933  Date:10/06/22  Time:4:11 AM   Provider Performing:Apphia Cropley A Kyerra Vargo DNP  Procedure: Intubation (31500)  Indication(s) Respiratory Failure  Consent Unable to obtain consent due to emergent nature of procedure.  Anesthesia Etomidate and Rocuronium  Time Out Verified patient identification, verified procedure, site/side was marked, verified correct patient position, special equipment/implants available, medications/allergies/relevant history reviewed, required imaging and test results available.  Sterile Technique Usual hand hygeine, masks, and gloves were used  Procedure Description Patient positioned in bed supine.  Sedation given as noted above.  Patient was intubated with endotracheal tube using Glidescope.  View was Grade 1 full glottis .  Number of attempts was 1.  Colorimetric CO2 detector was consistent with tracheal placement.  Complications/Tolerance None; patient tolerated the procedure well. Chest X-ray is ordered to verify placement.  EBL Minimal  Specimen(s) None   Webb Silversmith, DNP, CCRN, FNP-C, AGACNP-BC Acute Care & Family Nurse Practitioner  [facility]  See Amion for personal pager PCCM on call pager [phone] until 7 am

## 2022-10-06 NOTE — Consult Note (Signed)
Cardiology Consultation:   Patient ID: [name]; [id number]; Dec 09, 1933   Admit date: 10/05/2022 Date of Consult: 10/06/2022  Primary Care Provider: Luciana Axe, NP Primary Cardiologist: Mariah Milling Primary Electrophysiologist:  None   Patient Profile:   Nichole Hess is a 89 y.o. female with a hx of HFmrEF, persistent Afib, moderate to severe mitral and tricuspid regurgitation, CKD stage III, HTN, aortic atherosclerosis, hypothyroidism, and breast cancer who is being seen today for the evaluation of sinus bradycardia and acute hypoxic respiratory failure at the request of Webb Silversmith, NP.  History of Present Illness:   Nichole Hess was admitted to [facility] in 08/2022 with newly diagnosed Afib with RVR and acute HFmrEF. She was diuresed and rate controlled. Echo during the admission showed an EF of 45-50%, moderate LVH, no RWMA, normal RV systolic function and ventricular cavity size, RVSP 52.6 mmHg, severely dilated left atrium, moderate to severe mitral and tricuspid regurgitation, mild aortic insufficiency, and aortic valve sclerosis without evidence of stenosis. During that admission, troponin was normal. BNP 618. Plan for outpatient DCCV. She was seen in the ED on 91/7/24 with progressive dyspnea. BNP 1066 at that time. She was diuresed in the ED with symptomatic improvement. She was last seen in our office on 09/26/22 noting mild dyspnea and 2-pillow orthopnea. She was interested in pursuing rhythm control and was started on amiodarone 400 mg bid x 7 days, then 200 mg bid. DCCV scheduled for 10/09/22.   She was admitted to [facility] on 9/29 with progressive dyspnea and lower extremity swelling. Pulse ox at home with readings in the low to upper 80s%. She also noted bradycardia at home. Upon arrival to the ED, she was hypoxic at 88% and placed on supplemental oxygen. She was in sinus bradycardia with rates in the 40s bpm. BP 90/48. Troponin 32 x 2. BNP 1298. D-dimer 1.71. BUN/SCr 30/2.28 to  32/2.38 (baseline around 1 to 1.2). WBC 8.1 to 12.6. PCT <0.10. TSH 11.33, free T4 1.74. Lactic acid 1.7 to 2.3. CXR with COPD, small bilateral pleural effusions with bibasilar atelectasis and possible interstitial edema. Overnight, she developed increased work of breathing and was placed on BiPAP. She remained hypotensive and bradycardic. She received atropine without improvement and was subsequently intubated. Repeat CXR this morning showed evidence of bilateral pleural effusions and widespread additional pulmonary opacity possibly reflective of edema vs bilateral PNA. Remains intubated and sedated on Levophed this morning. Minimal UOP with IV Lasix in the ED.     Past Medical History:  Diagnosis Date   Breast cancer (HCC) 06/05/1999   lumpectomy-positive   Hypertension    Personal history of radiation therapy    Thyroid disease     Past Surgical History:  Procedure Laterality Date   BREAST BIOPSY Left 06/05/99   lumpectomy/rad   BREAST LUMPECTOMY Left 2001   CHOLECYSTECTOMY       Home Meds: Prior to Admission medications   Medication Sig Start Date End Date Taking? Authorizing Provider  amiodarone (PACERONE) 400 MG tablet Take 1 tablet (400 mg total) by mouth 2 (two) times daily. Take for 7 days. 09/26/22  Yes Gollan, Tollie Pizza, MD  apixaban (ELIQUIS) 5 MG TABS tablet Take 1 tablet (5 mg total) by mouth 2 (two) times daily. 08/29/22  Yes Arnetha Courser, MD  fluticasone (FLONASE) 50 MCG/ACT nasal spray Place 2 sprays into both nostrils daily.   Yes [provider]  furosemide (LASIX) 20 MG tablet Take 2 tablets (40 mg total) by mouth  daily. May take an additional 40 mg ( 1 tablet) daily as needed for shortness of breath or abdominal swelling. 09/26/22  Yes Gollan, Tollie Pizza, MD  gabapentin (NEURONTIN) 300 MG capsule Take 300 mg by mouth at bedtime.   Yes [provider]  levothyroxine (SYNTHROID) 75 MCG tablet Take 75 mcg by mouth daily before breakfast.   Yes [provider]  metoprolol tartrate (LOPRESSOR) 25 MG tablet Take 1 tablet (25 mg total) by mouth 2 (two) times daily. 08/29/22  Yes Arnetha Courser, MD  Multiple Vitamin (MULTI-VITAMINS) TABS Take 1 tablet by mouth daily.   Yes [provider]  potassium chloride (KLOR-CON) 10 MEQ tablet Take 1 tablet (10 mEq total) by mouth daily. May take an additional dose 10 Meq ( 1 tablet) as needed with extra lasix. 09/26/22 12/25/22 Yes Gollan, Tollie Pizza, MD  pravastatin (PRAVACHOL) 40 MG tablet Take 40 mg by mouth at bedtime.   Yes [provider]  amiodarone (PACERONE) 200 MG tablet Take 1 tablet (200 mg total) by mouth 2 (two) times daily. Start taking after completing 7 days of 400 mg twice daily. 10/05/22   Antonieta Iba, MD    Inpatient Medications: Scheduled Meds:  [START ON 10/07/2022] Chlorhexidine Gluconate Cloth  6 each Topical Daily   docusate  100 mg Per Tube BID   fentaNYL (SUBLIMAZE) injection  100 mcg Intravenous Once   hydrocortisone sod succinate (SOLU-CORTEF) inj  50 mg Intravenous Q8H   hydrocortisone sodium succinate       ipratropium-albuterol  3 mL Nebulization Q6H   morphine (PF)       mouth rinse  15 mL Mouth Rinse Q2H   polyethylene glycol  17 g Per Tube Daily   Continuous Infusions:  sodium chloride Stopped (10/06/22 0307)   fentaNYL infusion INTRAVENOUS 50 mcg/hr (10/06/22 0800)   norepinephrine (LEVOPHED) Adult infusion 6 mcg/min (10/06/22 0800)   PRN Meds: atropine, docusate sodium, fentaNYL, hydrocortisone sodium succinate, ipratropium-albuterol, morphine injection, morphine (PF), mouth rinse, polyethylene glycol  Allergies:  No Known Allergies  Social History:   Social History   Socioeconomic History   Marital status: Divorced    Spouse name: Not on file   Number of children: Not on file   Years of education: Not on file   Highest education level: Not on file  Occupational History   Not on file  Tobacco Use   Smoking status: Former     Current packs/day: 0.00    Average packs/day: 1 pack/day for 10.0 years (10.0 ttl pk-yrs)    Types: Cigarettes    Start date: 10/14/1958    Quit date: 10/13/1968    Years since quitting: 54.0   Smokeless tobacco: Never  Vaping Use   Vaping status: Never Used  Substance and Sexual Activity   Alcohol use: No   Drug use: No   Sexual activity: Not on file  Other Topics Concern   Not on file  Social History Narrative   Lives with daughterHenreitta Leber - [phone] ( mobile ).   Social Determinants of Health   Financial Resource Strain: Low Risk  (07/30/2022)   Received [facility]   Overall Financial Resource Strain (CARDIA)    Difficulty of Paying Living Expenses: Not hard at all  Food Insecurity: No Food Insecurity (08/27/2022)   Hunger Vital Sign    Worried About Running Out of Food in the Last Year: Never true    Ran Out of Food in the  Last Year: Never true  Transportation Needs: No Transportation Needs (08/27/2022)   PRAPARE - Administrator, Civil Service (Medical): No    Lack of Transportation (Non-Medical): No  Physical Activity: Not on file  Stress: Not on file  Social Connections: Not on file  Intimate Partner Violence: Not At Risk (08/27/2022)   Humiliation, Afraid, Rape, and Kick questionnaire    Fear of Current or Ex-Partner: No    Emotionally Abused: No    Physically Abused: No    Sexually Abused: No     Family History:   Family History  Problem Relation Age of Onset   Other Mother        sepsis   Hypertension Mother    Heart attack Father 36   Breast cancer Neg Hx     ROS:  Review of Systems  Unable to perform ROS: Intubated      Physical Exam/Data:   Vitals:   10/06/22 0800 10/06/22 0815 10/06/22 0830 10/06/22 0845  BP: (!) 83/70 (!) 86/68 119/67 117/67  Pulse: (!) 43 (!) 44 (!) 43 (!) 46  Resp: 18 18 18 18   Temp:  (!) 96.9 F (36.1 C)    TempSrc:  Axillary    SpO2: 95% 94% 94% 94%  Weight:       Height:        Intake/Output Summary (Last 24 hours) at 10/06/2022 0904 Last data filed at 10/06/2022 0800 Gross per 24 hour  Intake 144.94 ml  Output 12 ml  Net 132.94 ml   Filed Weights   10/06/22 0500  Weight: 73.4 kg   Body mass index is 28.66 kg/m.   Physical Exam: General: Well developed, well nourished, in no acute distress. Head: Normocephalic, atraumatic, sclera non-icteric, no xanthomas, nares without discharge.  Neck: Negative for carotid bruits. JVD difficult to assess secondary to respiratory support apparatus. Lungs: Diminished (especially along the bilateral bases) and vented breath sounds bilaterally. Heart: Bradycardic with S1 S2. II/VI systolic murmur, no rubs, or gallops appreciated. Abdomen: Soft, non-tender, non-distended with normoactive bowel sounds. No hepatomegaly. No rebound/guarding. No obvious abdominal masses. Msk:  Strength and tone appear normal for age. Extremities: No clubbing or cyanosis. No edema. Distal pedal pulses are 2+ and equal bilaterally. Neuro: Intubated and sedated. Psych:  Intubated and sedated.   EKG:  The EKG was personally reviewed and demonstrates: sinus bradycardia, 49 bpm, 1st degree AV block, LBBB Telemetry:  Telemetry was personally reviewed and demonstrates: sinus bradycardia, 40s bpm, BBB  Weights: Filed Weights   10/06/22 0500  Weight: 73.4 kg    Relevant CV Studies:  2D echo 08/27/2022: 1. Left ventricular ejection fraction, by estimation, is 45 to 50%. The  left ventricle has mildly decreased function. The left ventricle has no  regional wall motion abnormalities. There is moderate left ventricular  hypertrophy. Left ventricular  diastolic parameters are indeterminate.   2. Right ventricular systolic function is normal. The right ventricular  size is normal. There is moderately elevated pulmonary artery systolic  pressure. The estimated right ventricular systolic pressure is 52.6 mmHg.   3. Left atrial size  was severely dilated.   4. The mitral valve is normal in structure. Moderate to severe mitral  valve regurgitation. No evidence of mitral stenosis.   5. Tricuspid valve regurgitation is moderate to severe.   6. The aortic valve has an indeterminant number of cusps. There is mild  calcification of the aortic valve. Aortic valve regurgitation is mild.  Aortic valve sclerosis  is present, with no evidence of aortic valve  stenosis.   7. The inferior vena cava is normal in size with greater than 50%  respiratory variability, suggesting right atrial pressure of 3 mmHg.   Laboratory Data:  Chemistry Recent Labs  Lab 10/05/22 2257 10/06/22 0516  NA 132* 133*  K 4.8 5.1  CL 95* 98  CO2 26 23  GLUCOSE 136* 135*  BUN 30* 32*  CREATININE 2.28* 2.38*  CALCIUM 9.5 9.2  GFRNONAA 20* 19*  ANIONGAP 11 12    Recent Labs  Lab 10/05/22 2257  PROT 7.0  ALBUMIN 3.8  AST 26  ALT 30  ALKPHOS 59  BILITOT 1.3*   Hematology Recent Labs  Lab 10/05/22 2257 10/06/22 0516  WBC 8.1 12.6*  RBC 4.73 4.71  HGB 14.4 14.2  HCT 45.0 45.0  MCV 95.1 95.5  MCH 30.4 30.1  MCHC 32.0 31.6  RDW 15.0 14.9  PLT 263 258   Cardiac EnzymesNo results for input(s): "TROPONINI" in the last 168 hours. No results for input(s): "TROPIPOC" in the last 168 hours.  BNP Recent Labs  Lab 10/05/22 2257  BNP 1,298.0*    DDimer  Recent Labs  Lab 10/06/22 0106  DDIMER 1.71*    Radiology/Studies:  DG Chest 1 View  Result Date: 10/06/2022 IMPRESSION: 1. Satisfactory ET tube. See abdomen radiograph regarding enteric tube and other findings. 2. Ventilation not significantly changed. Evidence of bilateral pleural effusions and widespread additional pulmonary opacity which could be edema or bilateral pneumonia. Electronically Signed   By: Odessa Fleming M.D.   On: 10/06/2022 04:50   DG Abd 1 View  Result Date: 10/06/2022 IMPRESSION: 1. Satisfactory enteric tube placement into the stomach. 2. Evidence of calcified  ABDOMINAL AORTIC ANEURYSM, estimated at 4 cm diameter. Ultrasound or CT would best confirm size, from which follow-up and management recommendations would be made. Aortic aneurysm NOS (ICD10-I71.9). Aortic Atherosclerosis (ICD10-I70.0). 3. Paucity of bowel gas. Electronically Signed   By: Odessa Fleming M.D.   On: 10/06/2022 04:49   DG Chest Port 1 View  Result Date: 10/05/2022 IMPRESSION: COPD. Small bilateral effusions with bibasilar atelectasis. Heart mildly enlarged with interstitial prominence which may reflect interstitial edema. Electronically Signed   By: Charlett Nose M.D.   On: 10/05/2022 23:03    Assessment and Plan:   1. Acute hypoxic respiratory failure with acute on chronic HFmrEF: -Remains intubated and sedated -Volume up -Received IV Lasix 40 mg around 12:20 AM today with minimal UOP -Give a one-time dose of IV Lasix 80 mg and assess urine response  2. Persistent Afib with pharmacological cardioversion and subsequent sinus bradycardia and hypotension: -Outside of hospital pharmacological cardioversion on amiodarone and Lopressor -Remains in sinus bradycardia in the 40s bpm and on Levophed, wean as tolerated  -No emergent indication for temp wire or PPM at this time -Allow for washout of Lopressor and amiodarone -If needed, could dopamine  -Monitor on tele -With pharmacologic cardioversion, start heparin gtt with recommendation for PTA OAC prior to discharge once it is clear she will not need inpatient procedures -Pacer pads at bedside   3. Acute on CKD stage III: -Cannot exclude congestion and component of ATN with hypotension and bradycardia -Monitor with diuresis   4. Elevated troponin with LBBB: -Minimal troponin elevation and flat trending, not consistent with ACS -Obtain limited echo to evaluate for new cardiomyopathy  5. Moderate to severe mitral and tricuspid regurgitation: -Management as above -Outpatient follow up     For questions  or updates, please contact  [facility] Please consult [email or web address] for contact info under Cardiology/STEMI.   Signed, Eula Listen, PA-C [facility] Pager: [phone] 10/06/2022, 9:04 AM

## 2022-10-06 NOTE — H&P (Addendum)
NAME:  Nichole Hess, MRN:  [id number], DOB:  04-24-1933, LOS: 0 ADMISSION DATE:  10/05/2022, CONSULTATION DATE:  10/06/22 REFERRING MD: Loleta Rose CHIEF COMPLAINT: SOB   HPI  89 y.o female with significant PMH of HTN, Ao atherosclerosis, hypothyroidism, and CKD III, HFmrEF, Atrial Fibrillation on Eliquis, and HLD who presented to the ED with chief complaints of progressive shortness of breath.  On review of the chart, patient was hospitalized at [facility] on 8/20 to 8/22 for new onset atrial fibrillation with RVR and acute HFmrEF in the setting of A-fib with RVR.  She was started on beta-blocker, Eliquis and discharged on Lasix.  Since discharge she followed with her cardiologist on 09/12/2022 who continued her metoprolol and Lasix but held Coreg, losartan and amlodipine due to hypotension.  Patient returned to the ED on 9/17 with shortness of breath and weight gain.  She was again diuresed and discharged home to follow-up with her cardiologist.  Patient saw her cardiologist again on 09/26/2022 who recommended starting amiodarone for rate control and possible cardioversion if she remained in A-fib.  She was also continued Eliquis 5 mg twice daily and her Lasix increased to 240 mg daily an additional 40 in the afternoon for shortness of breath or abdominal swelling.  Patient's developed worsening shortness of breath yesterday afternoon with associated orthopnea and increased work of breathing which prompted her to come to the ED. Denies fevers, chills, n/v or chest pain.   ED Course: Initial vital signs showed HR of beats/minute, BP 90/48 mm Hg, the RR 24 breaths/minute, and the oxygen saturation 93% on 2L and a temperature of 97.8F (36.4C).  Pertinent Labs/Diagnostics Findings: Na+/ K+:132/4.8 Glucose: 136 BUN/Cr.:30/2.28 PCT: negative <0.10  Lactic acid: 1.7~2.3 TSH:11.30, Free T4:1.74 COVID PCR: Negative,  troponin:32   BNP: 1298  CXR. See below  Past Medical History  HTN, Ao atherosclerosis,  hypothyroidism, and CKD III, HFmrEF, Atrial Fibrillation on Eliquis, and HLD   Significant Hospital Events   9/30: Admit to ICU with Acute on Chronic Respiratory Failure secondary to CHF exacerbation, failed BiPAP requiring intubation.  Consults:  Cardiology  Procedures:  9/30: Intubation  Significant Diagnostic Tests:  9/29: Chest Xray> IMPRESSION: COPD. Small bilateral effusions with bibasilar atelectasis. Heart mildly enlarged with interstitial prominence which may reflect interstitial edema.  Interim History / Subjective:    -  Micro Data:  9/29: SARS-CoV-2 PCR> negative 9/29: Influenza PCR> negative 9/30: Blood culture x2> 9/30: MRSA PCR>>  9/30: Strep pneumo urinary antigen> 9/30: Legionella urinary antigen>  Antimicrobials:  None  OBJECTIVE  Blood pressure (!) 96/52, pulse (!) 48, temperature 98.7 F (37.1 C), temperature source Rectal, resp. rate (!) 29, height 5\' 3"  (1.6 m), SpO2 100%.    FiO2 (%):  [100 %] 100 %  No intake or output data in the 24 hours ending 10/06/22 0153 There were no vitals filed for this visit.  Physical Examination  GENERAL: 89 year-old critically ill patient lying in the bed intubated and Sedated EYES: PEERLA. No scleral icterus. Extraocular muscles intact.  HEENT: Head atraumatic, normocephalic. Oropharynx and nasopharynx clear.  NECK:  No JVD, supple  LUNGS: Decreased breath sounds with moderate wheezing bilaterally.  Mild use of accessory muscles of respiration.  CARDIOVASCULAR: S1, S2 normal. No murmurs, rubs, or gallops.  ABDOMEN: Soft, NTND EXTREMITIES: trace bilateral lower extremity edema. Capillary refill < 3 seconds in all extremities. Pulses palpable distally. NEUROLOGIC: The patient is intubated and sedated. No focal neurological deficit appreciated. Cranial nerves are  intact.  SKIN: No obvious rash, lesion, or ulcer. Warm to touch Labs/imaging that I havepersonally reviewed  (right click and "Reselect all SmartList  Selections" daily)     Labs   CBC: Recent Labs  Lab 10/05/22 2257  WBC 8.1  NEUTROABS 5.3  HGB 14.4  HCT 45.0  MCV 95.1  PLT 263   Basic Metabolic Panel: Recent Labs  Lab 10/05/22 2257  NA 132*  K 4.8  CL 95*  CO2 26  GLUCOSE 136*  BUN 30*  CREATININE 2.28*  CALCIUM 9.5   GFR: Estimated Creatinine Clearance: 16.2 mL/min (A) (by C-G formula based on SCr of 2.28 mg/dL (H)). Recent Labs  Lab 10/05/22 2257 10/06/22 0106  WBC 8.1  --   LATICACIDVEN 1.7 2.3*    Liver Function Tests: Recent Labs  Lab 10/05/22 2257  AST 26  ALT 30  ALKPHOS 59  BILITOT 1.3*  PROT 7.0  ALBUMIN 3.8   No results for input(s): "LIPASE", "AMYLASE" in the last 168 hours. No results for input(s): "AMMONIA" in the last 168 hours.  ABG No results found for: "PHART", "PCO2ART", "PO2ART", "HCO3", "TCO2", "ACIDBASEDEF", "O2SAT"   Coagulation Profile: Recent Labs  Lab 10/05/22 2257  INR 2.3*   Cardiac Enzymes: No results for input(s): "CKTOTAL", "CKMB", "CKMBINDEX", "TROPONINI" in the last 168 hours.  HbA1C: Hgb A1c MFr Bld  Date/Time Value Ref Range Status  08/27/2022 05:25 AM 5.6 4.8 - 5.6 % Final    Comment:    (NOTE) Pre diabetes:          5.7%-6.4%  Diabetes:              >6.4%  Glycemic control for   <7.0% adults with diabetes     CBG: No results for input(s): "GLUCAP" in the last 168 hours.  Review of Systems:   Unable to be obtained secondary to the patient's intubated and sedated status.   Past Medical History  She,  has a past medical history of Breast cancer (HCC) (06/05/1999), Hypertension, Personal history of radiation therapy, and Thyroid disease.   Surgical History    Past Surgical History:  Procedure Laterality Date   BREAST BIOPSY Left 06/05/99   lumpectomy/rad   BREAST LUMPECTOMY Left 2001   CHOLECYSTECTOMY       Social History   reports that she quit smoking about 54 years ago. Her smoking use included cigarettes. She started smoking about  64 years ago. She has a 10 pack-year smoking history. She has never used smokeless tobacco. She reports that she does not drink alcohol and does not use drugs.   Family History   Her family history includes Heart attack (age of onset: 48) in her father; Hypertension in her mother; Other in her mother. There is no history of Breast cancer.   Allergies No Known Allergies   Home Medications  Prior to Admission medications   Medication Sig Start Date End Date Taking? Authorizing Provider  amiodarone (PACERONE) 400 MG tablet Take 1 tablet (400 mg total) by mouth 2 (two) times daily. Take for 7 days. 09/26/22  Yes Gollan, Tollie Pizza, MD  apixaban (ELIQUIS) 5 MG TABS tablet Take 1 tablet (5 mg total) by mouth 2 (two) times daily. 08/29/22  Yes Arnetha Courser, MD  fluticasone (FLONASE) 50 MCG/ACT nasal spray Place 2 sprays into both nostrils daily.   Yes [provider]  furosemide (LASIX) 20 MG tablet Take 2 tablets (40 mg total) by mouth daily. May take an additional 40 mg (  1 tablet) daily as needed for shortness of breath or abdominal swelling. 09/26/22  Yes Gollan, Tollie Pizza, MD  gabapentin (NEURONTIN) 300 MG capsule Take 300 mg by mouth at bedtime.   Yes [provider]  levothyroxine (SYNTHROID) 75 MCG tablet Take 75 mcg by mouth daily before breakfast.   Yes [provider]  metoprolol tartrate (LOPRESSOR) 25 MG tablet Take 1 tablet (25 mg total) by mouth 2 (two) times daily. 08/29/22  Yes Arnetha Courser, MD  Multiple Vitamin (MULTI-VITAMINS) TABS Take 1 tablet by mouth daily.   Yes [provider]  potassium chloride (KLOR-CON) 10 MEQ tablet Take 1 tablet (10 mEq total) by mouth daily. May take an additional dose 10 Meq ( 1 tablet) as needed with extra lasix. 09/26/22 12/25/22 Yes Gollan, Tollie Pizza, MD  pravastatin (PRAVACHOL) 40 MG tablet Take 40 mg by mouth at bedtime.   Yes [provider]  amiodarone (PACERONE) 200 MG tablet Take 1 tablet (200 mg total)  by mouth 2 (two) times daily. Start taking after completing 7 days of 400 mg twice daily. 10/05/22   Antonieta Iba, MD  Scheduled Meds:  docusate  100 mg Per Tube BID   fentaNYL (SUBLIMAZE) injection  100 mcg Intravenous Once   hydrocortisone sod succinate (SOLU-CORTEF) inj  50 mg Intravenous Q8H   hydrocortisone sodium succinate       ipratropium-albuterol  3 mL Nebulization Q6H   ipratropium-albuterol  3 mL Nebulization Q6H   morphine (PF)       polyethylene glycol  17 g Per Tube Daily   Continuous Infusions:  sodium chloride 250 mL (10/06/22 0138)   fentaNYL infusion INTRAVENOUS 50 mcg/hr (10/06/22 0531)   norepinephrine (LEVOPHED) Adult infusion 2 mcg/min (10/06/22 0141)   PRN Meds:.atropine, docusate sodium, fentaNYL, hydrocortisone sodium succinate, ipratropium-albuterol, morphine injection, morphine (PF), polyethylene glycol  Active Hospital Problem list   See systems below  Assessment & Plan:  Symptomatic Bradycardia #Low output Cardiogenic shock #Acute on Chronic Decompensated HF~BNP significantly elevated #Atrial Fibrillation PMHx: HTN, HLD -EF 45-50% by echo on 08/27/22 -Unclear precipitating factor however TSH is significantly elevated maybe worsening symptoms -Cautious IV fluids -Vasopressors for MAP goal>65 -Trend lactic acid until normalized -Trend HS Troponin until peaked  -BNP is 1298 -Check Coox and CVP once central line placed -Echocardiogram -Hold Amiodarone and Metoprolol for now -Diuresis as BP and renal function permits ~ holding due to shock and AKI -Cardiology consulted -Hold Eliquis while NPO and start Heparin   #Acute Hypoxic Respiratory Failure in setting of Acute Decompensated HF, bilateral pleural effusions -Full vent support, implement lung protective strategies -Plateau pressures less than 30 cm H20 -Wean FiO2 & PEEP as tolerated to maintain O2 sats >92% -Follow intermittent Chest X-ray & ABG as needed -Spontaneous Breathing Trials when  respiratory parameters met and mental status permits -Implement VAP Bundle -Prn Bronchodilators   #Acute Kidney Injury on CKD stage III #Lactic Acidosis -Monitor I&O's / urinary output -Follow BMP -Ensure adequate renal perfusion -Avoid nephrotoxic agents as able -Replace electrolytes as indicated    #Concern for Pneumonia -Monitor fever curve -Trend WBC's & Procalcitonin -Follow cultures as above -Check respiratory panel, strep and Legionella antigen -Hold empiric abx pending cultures & sensitivities    #Sedation needs in setting of mechanical ventilation -Maintain a RASS goal of 0 to -1 -Propofol if needed to maintain RASS goal ~ currently on NO sedation -Avoid sedating medications as able -Daily wake up assessment  #Severe Hypothyroidism Hx of Hypothyroidism on Synthroid  likely primary in the setting of poorly controlled hypothyroid patient. Patient's daughter reports  she may have been missing her doses of Synthroid  Na 125, Glu144, T4,<0.25, TSH 200.017:AMS (lethargy), hypothermia, hypotension, s/s of HF -hydrocortisone 100mg  q8h  -check cortisol level -Levothyroxine (T4) Loading dose 75 mcg IV followed by 50-100 mcg IV QD until able to take T4 orally.  -Avoid aggressive IVFs   Best practice:  Diet:  NPO Pain/Anxiety/Delirium protocol (if indicated): Yes (RASS goal -1) VAP protocol (if indicated): Yes DVT prophylaxis: Systemic AC GI prophylaxis: N/A Glucose control:  SSI Yes Central venous access:  N/A Arterial line:  N/A Foley:  Yes, and it is still needed Mobility:  bed rest  PT consulted: N/A Last date of multidisciplinary goals of care discussion [9/30] Code Status:  full code Disposition: ICU   = Goals of Care = Code Status Order: FULL  Primary Emergency Contact: JEFFRIES,SHERRY, Home Phone: [phone] Patient's dauighter who is her POA Wishes to pursue full aggressive treatment and intervention options, including CPR and intubation  Critical care  time: 45 minutes        Webb Silversmith DNP, CCRN, FNP-C, AGACNP-BC Acute Care & Family Nurse Practitioner  Pulmonary & Critical Care Medicine PCCM on call pager [phone]

## 2022-10-07 ENCOUNTER — Other Ambulatory Visit: Payer: Self-pay

## 2022-10-07 DIAGNOSIS — I5033 Acute on chronic diastolic (congestive) heart failure: Secondary | ICD-10-CM

## 2022-10-07 DIAGNOSIS — Z7189 Other specified counseling: Secondary | ICD-10-CM | POA: Diagnosis not present

## 2022-10-07 DIAGNOSIS — E440 Moderate protein-calorie malnutrition: Secondary | ICD-10-CM | POA: Insufficient documentation

## 2022-10-07 LAB — CBC
HCT: 37.7 % (ref 36.0–46.0)
Hemoglobin: 13 g/dL (ref 12.0–15.0)
MCH: 30.2 pg (ref 26.0–34.0)
MCHC: 34.5 g/dL (ref 30.0–36.0)
MCV: 87.5 fL (ref 80.0–100.0)
Platelets: 231 10*3/uL (ref 150–400)
RBC: 4.31 MIL/uL (ref 3.87–5.11)
RDW: 14.6 % (ref 11.5–15.5)
WBC: 12.4 10*3/uL — ABNORMAL HIGH (ref 4.0–10.5)
nRBC: 0 % (ref 0.0–0.2)

## 2022-10-07 LAB — RENAL FUNCTION PANEL
Albumin: 3.1 g/dL — ABNORMAL LOW (ref 3.5–5.0)
Anion gap: 11 (ref 5–15)
BUN: 42 mg/dL — ABNORMAL HIGH (ref 8–23)
CO2: 26 mmol/L (ref 22–32)
Calcium: 8.8 mg/dL — ABNORMAL LOW (ref 8.9–10.3)
Chloride: 98 mmol/L (ref 98–111)
Creatinine, Ser: 2.8 mg/dL — ABNORMAL HIGH (ref 0.44–1.00)
GFR, Estimated: 16 mL/min — ABNORMAL LOW (ref >60)
Glucose, Bld: 148 mg/dL — ABNORMAL HIGH (ref 70–99)
Phosphorus: 4.5 mg/dL (ref 2.5–4.6)
Potassium: 4.2 mmol/L (ref 3.5–5.1)
Sodium: 135 mmol/L (ref 135–145)

## 2022-10-07 LAB — ECHOCARDIOGRAM LIMITED
AV Mean grad: 8 mm[Hg]
AV Peak grad: 16.5 mm[Hg]
Ao pk vel: 2.03 m/s
Area-P 1/2: 2.93 cm2
Calc EF: 60.9 %
Height: 63 in
P 1/2 time: 718 ms
S' Lateral: 3.1 cm
Single Plane A2C EF: 59.1 %
Single Plane A4C EF: 60.1 %
Weight: 2589.08 [oz_av]

## 2022-10-07 LAB — LIPID PANEL
Cholesterol: 115 mg/dL (ref 0–200)
HDL: 42 mg/dL (ref >40)
LDL Cholesterol: 63 mg/dL (ref 0–99)
Total CHOL/HDL Ratio: 2.7 {ratio}
Triglycerides: 51 mg/dL (ref <150)
VLDL: 10 mg/dL (ref 0–40)

## 2022-10-07 LAB — APTT
aPTT: 126 s — ABNORMAL HIGH (ref 24–36)
aPTT: 68 s — ABNORMAL HIGH (ref 24–36)

## 2022-10-07 LAB — GLUCOSE, CAPILLARY
Glucose-Capillary: 127 mg/dL — ABNORMAL HIGH (ref 70–99)
Glucose-Capillary: 138 mg/dL — ABNORMAL HIGH (ref 70–99)
Glucose-Capillary: 121 mg/dL — ABNORMAL HIGH (ref 70–99)
Glucose-Capillary: 108 mg/dL — ABNORMAL HIGH (ref 70–99)
Glucose-Capillary: 108 mg/dL — ABNORMAL HIGH (ref 70–99)

## 2022-10-07 LAB — COOXEMETRY PANEL
Carboxyhemoglobin: 1.7 % — ABNORMAL HIGH (ref 0.5–1.5)
Methemoglobin: 0.7 % (ref 0.0–1.5)
O2 Saturation: 73.1 %
Total hemoglobin: 13.1 g/dL (ref 12.0–16.0)
Total oxygen content: 71.3 %

## 2022-10-07 LAB — HEPATIC FUNCTION PANEL
ALT: 112 U/L — ABNORMAL HIGH (ref 0–44)
AST: 96 U/L — ABNORMAL HIGH (ref 15–41)
Albumin: 3 g/dL — ABNORMAL LOW (ref 3.5–5.0)
Alkaline Phosphatase: 49 U/L (ref 38–126)
Bilirubin, Direct: 0.3 mg/dL — ABNORMAL HIGH (ref 0.0–0.2)
Indirect Bilirubin: 0.8 mg/dL (ref 0.3–0.9)
Total Bilirubin: 1.1 mg/dL (ref 0.3–1.2)
Total Protein: 6 g/dL — ABNORMAL LOW (ref 6.5–8.1)

## 2022-10-07 LAB — HEPARIN LEVEL (UNFRACTIONATED): Heparin Unfractionated: >1.1 [IU]/mL — ABNORMAL HIGH (ref 0.30–0.70)

## 2022-10-07 LAB — LACTIC ACID, PLASMA
Lactic Acid, Venous: 1.5 mmol/L (ref 0.5–1.9)
Lactic Acid, Venous: 1.2 mmol/L (ref 0.5–1.9)

## 2022-10-07 LAB — PROTIME-INR
INR: 2.4 — ABNORMAL HIGH (ref 0.8–1.2)
Prothrombin Time: 26.6 s — ABNORMAL HIGH (ref 11.4–15.2)

## 2022-10-07 LAB — MAGNESIUM: Magnesium: 2.1 mg/dL (ref 1.7–2.4)

## 2022-10-07 LAB — C-REACTIVE PROTEIN: CRP: 0.5 mg/dL (ref <1.0)

## 2022-10-07 MED ORDER — FUROSEMIDE 10 MG/ML IJ SOLN
80.0000 mg | Freq: Once | INTRAMUSCULAR | Status: AC
  Administered 2022-10-07: 80 mg via INTRAVENOUS
  Filled 2022-10-07: qty 8

## 2022-10-07 MED ORDER — FENTANYL CITRATE PF 50 MCG/ML IJ SOSY: 25.0000 ug | PREFILLED_SYRINGE | INTRAMUSCULAR | Status: DC | PRN

## 2022-10-07 MED ORDER — ALBUTEROL SULFATE (2.5 MG/3ML) 0.083% IN NEBU: 2.5000 mg | INHALATION_SOLUTION | RESPIRATORY_TRACT | Status: DC | PRN

## 2022-10-07 MED ORDER — ORAL CARE MOUTH RINSE
15.0000 mL | OROMUCOSAL | Status: DC
  Administered 2022-10-08 – 2022-10-11 (×9): 15 mL via OROMUCOSAL

## 2022-10-07 MED ORDER — ALBUTEROL SULFATE (2.5 MG/3ML) 0.083% IN NEBU
2.5000 mg | INHALATION_SOLUTION | Freq: Four times a day (QID) | RESPIRATORY_TRACT | Status: DC
  Administered 2022-10-07 – 2022-10-08 (×4): 2.5 mg via RESPIRATORY_TRACT
  Filled 2022-10-07 (×4): qty 3

## 2022-10-07 MED ORDER — MIDAZOLAM HCL 2 MG/2ML IJ SOLN: 1.0000 mg | INTRAMUSCULAR | Status: DC | PRN

## 2022-10-07 MED ORDER — ORAL CARE MOUTH RINSE: 15.0000 mL | OROMUCOSAL | Status: DC | PRN

## 2022-10-07 MED ORDER — SODIUM CHLORIDE 0.9 % IV SOLN
1.0000 g | INTRAVENOUS | Status: DC
  Administered 2022-10-07: 1 g via INTRAVENOUS
  Filled 2022-10-07 (×2): qty 10

## 2022-10-07 MED ORDER — SODIUM CHLORIDE 0.9% FLUSH
10.0000 mL | Freq: Two times a day (BID) | INTRAVENOUS | Status: DC
  Administered 2022-10-07 – 2022-10-11 (×9): 10 mL

## 2022-10-07 MED ORDER — SODIUM CHLORIDE 0.9 % IV SOLN: 1.0000 g | Freq: Two times a day (BID) | INTRAVENOUS | Status: DC

## 2022-10-07 MED ORDER — SODIUM CHLORIDE 0.9% FLUSH: 10.0000 mL | INTRAVENOUS | Status: DC | PRN

## 2022-10-07 NOTE — Consult Note (Addendum)
Advanced Heart Failure Team Consult Note   Primary Physician: Luciana Axe, NP PCP-Cardiologist:  Julien Nordmann, MD  Reason for Consultation: A/C diastolic HF>cardiogenic shock  HPI:    Nichole Hess is seen today for evaluation of A/C diastolic HF>cardiogenic shock at the request of Dr. Okey Dupre, cardiology.   Nichole Hess is a 89 y.o. female with HTN, Ao atherosclerosis, CKD III, HFmrEF, breast cancer, a fib on eliquis, hypothyroidism, and HLD.   Nichole Hess was recently admitted 8/24 with new onset a fib RVR and acute HFmfEF. Rate controlled at discharge with BB. Remained in a fib with higher rates at cardiology f/u, started on amiodarone and plans made for DCCV in October.   She presented to the ED 9/29 with SOB and BLE edema. EKG showed SB. SPO2 in 80s, required BiPAP. Lactic acid 1.7>2.3. HsTrop 32>32. BNP 1.2K. SCr 2.28, K 5.1. CXR with bilateral pleural effusions. Resp panel (-).  Due to worsening bradycardia (didn't respond to atropine) and hypotension patient was intubated and sedated. Since being admitted she was started on DBA and diuresed with IV lasix. Echo with EF 45-50%.   Awake but drowsy, follows commands on the vent.   Cardiac studies reviewed: Echo this admission, EF 45-50%, mild RV dysfunction, mild MR Echo 08/27/22: EF 45-50%, normal RV, LA severely dilated, mod-severe MR/TR.   Home Medications Prior to Admission medications   Medication Sig Start Date End Date Taking? Authorizing Provider  amiodarone (PACERONE) 400 MG tablet Take 1 tablet (400 mg total) by mouth 2 (two) times daily. Take for 7 days. 09/26/22  Yes Gollan, Tollie Pizza, MD  apixaban (ELIQUIS) 5 MG TABS tablet Take 1 tablet (5 mg total) by mouth 2 (two) times daily. 08/29/22  Yes Arnetha Courser, MD  fluticasone (FLONASE) 50 MCG/ACT nasal spray Place 2 sprays into both nostrils daily.   Yes [provider]  furosemide (LASIX) 20 MG tablet Take 2 tablets (40 mg total) by mouth daily. May take  an additional 40 mg ( 1 tablet) daily as needed for shortness of breath or abdominal swelling. 09/26/22  Yes Gollan, Tollie Pizza, MD  gabapentin (NEURONTIN) 300 MG capsule Take 300 mg by mouth at bedtime.   Yes [provider]  levothyroxine (SYNTHROID) 75 MCG tablet Take 75 mcg by mouth daily before breakfast.   Yes [provider]  metoprolol tartrate (LOPRESSOR) 25 MG tablet Take 1 tablet (25 mg total) by mouth 2 (two) times daily. 08/29/22  Yes Arnetha Courser, MD  Multiple Vitamin (MULTI-VITAMINS) TABS Take 1 tablet by mouth daily.   Yes [provider]  potassium chloride (KLOR-CON) 10 MEQ tablet Take 1 tablet (10 mEq total) by mouth daily. May take an additional dose 10 Meq ( 1 tablet) as needed with extra lasix. 09/26/22 12/25/22 Yes Gollan, Tollie Pizza, MD  pravastatin (PRAVACHOL) 40 MG tablet Take 40 mg by mouth at bedtime.   Yes [provider]  amiodarone (PACERONE) 200 MG tablet Take 1 tablet (200 mg total) by mouth 2 (two) times daily. Start taking after completing 7 days of 400 mg twice daily. 10/05/22   Antonieta Iba, MD    Past Medical History: Past Medical History:  Diagnosis Date   Breast cancer (HCC) 06/05/1999   lumpectomy-positive   Hypertension    Personal history of radiation therapy    Thyroid disease     Past Surgical History: Past Surgical History:  Procedure Laterality Date   BREAST BIOPSY Left 06/05/99  lumpectomy/rad   BREAST LUMPECTOMY Left 2001   CHOLECYSTECTOMY      Family History: Family History  Problem Relation Age of Onset   Other Mother        sepsis   Hypertension Mother    Heart attack Father 41   Breast cancer Neg Hx     Social History: Social History   Socioeconomic History   Marital status: Divorced    Spouse name: Not on file   Number of children: Not on file   Years of education: Not on file   Highest education level: Not on file  Occupational History   Not on file  Tobacco Use   Smoking  status: Former    Current packs/day: 0.00    Average packs/day: 1 pack/day for 10.0 years (10.0 ttl pk-yrs)    Types: Cigarettes    Start date: 10/14/1958    Quit date: 10/13/1968    Years since quitting: 54.0   Smokeless tobacco: Never  Vaping Use   Vaping status: Never Used  Substance and Sexual Activity   Alcohol use: No   Drug use: No   Sexual activity: Not on file  Other Topics Concern   Not on file  Social History Narrative   Lives with daughterHenreitta Hess - [phone] ( mobile ).   Social Determinants of Health   Financial Resource Strain: Low Risk  (07/30/2022)   Received from [facility]   Overall Financial Resource Strain (CARDIA)    Difficulty of Paying Living Expenses: Not hard at all  Food Insecurity: No Food Insecurity (08/27/2022)   Hunger Vital Sign    Worried About Running Out of Food in the Last Year: Never true    Ran Out of Food in the Last Year: Never true  Transportation Needs: No Transportation Needs (08/27/2022)   PRAPARE - Administrator, Civil Service (Medical): No    Lack of Transportation (Non-Medical): No  Physical Activity: Not on file  Stress: Not on file  Social Connections: Not on file    Allergies:  No Known Allergies  Objective:    Vital Signs:   Temp:  [96.9 F (36.1 C)-99 F (37.2 C)] 97.7 F (36.5 C) (10/01 0400) Pulse Rate:  [40-50] 45 (10/01 0700) Resp:  [16-20] 18 (10/01 0700) BP: (78-144)/(46-115) 94/46 (10/01 0700) SpO2:  [94 %-100 %] 96 % (10/01 0700) FiO2 (%):  [28 %-100 %] 28 % (10/01 0733) Weight:  [70.8 kg] 70.8 kg (10/01 0354) Last BM Date :  (PTA)  Weight change: Filed Weights   10/05/22 2222 10/06/22 0500 10/07/22 0354  Weight: 73.4 kg 73.4 kg 70.8 kg    Intake/Output:   Intake/Output Summary (Last 24 hours) at 10/07/2022 0754 Last data filed at 10/07/2022 0600 Gross per 24 hour  Intake 997.22 ml  Output 778 ml  Net 219.22 ml      Physical Exam    General:   intubated and sedated HEENT: +ETT, +OG Neck: supple. JVD difficult to see. Carotids 2+ bilat; no bruits. No lymphadenopathy or thyromegaly appreciated. Cor: PMI nondisplaced. brady rate & regular rhythm. No rubs, gallops or murmurs. Lungs: clear, diminished bases Abdomen: soft, nontender, nondistended. No hepatosplenomegaly. No bruits or masses. Good bowel sounds. GU: +foley Extremities: no cyanosis, clubbing, rash, +1 BLE edema  Neuro: follows commands  Telemetry   SB 50s (Personally reviewed)    EKG    SB 1st degree AVB 40s  Labs   Basic Metabolic Panel: Recent Labs  Lab 10/05/22 2257 10/06/22 0516 10/07/22 0408  NA 132* 133* 135  K 4.8 5.1 4.2  CL 95* 98 98  CO2 26 23 26   GLUCOSE 136* 135* 148*  BUN 30* 32* 42*  CREATININE 2.28* 2.38* 2.80*  CALCIUM 9.5 9.2 8.8*  MG  --   --  2.1  PHOS  --   --  4.5    Liver Function Tests: Recent Labs  Lab 10/05/22 2257 10/07/22 0408  AST 26 96*  ALT 30 112*  ALKPHOS 59 49  BILITOT 1.3* 1.1  PROT 7.0 6.0*  ALBUMIN 3.8 3.0*  3.1*   No results for input(s): "LIPASE", "AMYLASE" in the last 168 hours. No results for input(s): "AMMONIA" in the last 168 hours.  CBC: Recent Labs  Lab 10/05/22 2257 10/06/22 0516 10/07/22 0408  WBC 8.1 12.6* 12.4*  NEUTROABS 5.3  --   --   HGB 14.4 14.2 13.0  HCT 45.0 45.0 37.7  MCV 95.1 95.5 87.5  PLT 263 258 231    Cardiac Enzymes: No results for input(s): "CKTOTAL", "CKMB", "CKMBINDEX", "TROPONINI" in the last 168 hours.  BNP: BNP (last 3 results) Recent Labs    08/26/22 1817 09/23/22 1339 10/05/22 2257  BNP 618.6* 1,066.5* 1,298.0*    ProBNP (last 3 results) No results for input(s): "PROBNP" in the last 8760 hours.   CBG: Recent Labs  Lab 10/06/22 1641 10/06/22 1948 10/06/22 2342 10/07/22 0347 10/07/22 0732  GLUCAP 152* 147* 149* 127* 138*    Coagulation Studies: Recent Labs    10/05/22 2257 10/06/22 1111 10/07/22 0408  LABPROT 25.2* 28.8* 26.6*   INR 2.3* 2.7* 2.4*     Imaging   US Venous Img Lower Bilateral (DVT)  Result Date: 10/06/2022 CLINICAL DATA:  89 year old female with a history elevated D-dimer and leg pain EXAM: BILATERAL LOWER EXTREMITY VENOUS DOPPLER ULTRASOUND TECHNIQUE: Gray-scale sonography with graded compression, as well as color Doppler and duplex ultrasound were performed to evaluate the lower extremity deep venous systems from the level of the common femoral vein and including the common femoral, femoral, profunda femoral, popliteal and calf veins including the posterior tibial, peroneal and gastrocnemius veins when visible. The superficial great saphenous vein was also interrogated. Spectral Doppler was utilized to evaluate flow at rest and with distal augmentation maneuvers in the common femoral, femoral and popliteal veins. COMPARISON:  None Available. FINDINGS: RIGHT LOWER EXTREMITY Common Femoral Vein: No evidence of thrombus. Normal compressibility, respiratory phasicity and response to augmentation. Saphenofemoral Junction: No evidence of thrombus. Normal compressibility and flow on color Doppler imaging. Profunda Femoral Vein: No evidence of thrombus. Normal compressibility and flow on color Doppler imaging. Femoral Vein: No evidence of thrombus. Normal compressibility, respiratory phasicity and response to augmentation. Popliteal Vein: No evidence of thrombus. Normal compressibility, respiratory phasicity and response to augmentation. Calf Veins: No evidence of thrombus. Normal compressibility and flow on color Doppler imaging. Superficial Great Saphenous Vein: No evidence of thrombus. Normal compressibility and flow on color Doppler imaging. Other Findings:  None. LEFT LOWER EXTREMITY Common Femoral Vein: No evidence of thrombus. Normal compressibility, respiratory phasicity and response to augmentation. Saphenofemoral Junction: No evidence of thrombus. Normal compressibility and flow on color Doppler imaging.  Profunda Femoral Vein: No evidence of thrombus. Normal compressibility and flow on color Doppler imaging. Femoral Vein: No evidence of thrombus. Normal compressibility, respiratory phasicity and response to augmentation. Popliteal Vein: No evidence of thrombus. Normal compressibility, respiratory phasicity and response to augmentation. Calf Veins: No evidence of thrombus. Normal  compressibility and flow on color Doppler imaging. Superficial Great Saphenous Vein: No evidence of thrombus. Normal compressibility and flow on color Doppler imaging. Other Findings:  None. IMPRESSION: Directed duplex of the bilateral lower extremities negative for DVT Signed, Yvone Neu. Miachel Roux, RPVI [facility] Electronically Signed   By: Gilmer Mor D.O.   On: 10/06/2022 12:06     Medications:     Current Medications:  Chlorhexidine Gluconate Cloth  6 each Topical Daily   docusate  100 mg Per Tube BID   famotidine  20 mg Per Tube QHS   fentaNYL (SUBLIMAZE) injection  100 mcg Intravenous Once   free water  30 mL Per Tube Q4H   hydrocortisone sod succinate (SOLU-CORTEF) inj  50 mg Intravenous Q8H   ipratropium-albuterol  3 mL Nebulization Q6H   mouth rinse  15 mL Mouth Rinse Q2H   polyethylene glycol  17 g Per Tube Daily    Infusions:  sodium chloride Stopped (10/06/22 0307)   cefTRIAXone (ROCEPHIN)  IV     DOBUTamine 4.995 mcg/kg/min (10/07/22 0600)   feeding supplement (VITAL AF 1.2 CAL) 50 mL/hr at 10/07/22 0100   fentaNYL infusion INTRAVENOUS 90 mcg/hr (10/07/22 0600)   heparin 700 Units/hr (10/07/22 0600)   norepinephrine (LEVOPHED) Adult infusion Stopped (10/07/22 0401)      Patient Profile   Nichole Hess is a 89 y.o. female with HTN, Ao atherosclerosis, CKD III, HFmrEF, breast cancer, a fib on eliquis, hypothyroidism, and HLD. AHF team to see for A/C diastolic HF.   Assessment/Plan  Acute on chronic diastolic HF>Cardiogenic  shock - SCAI stage B/C - Echo this admission EF 45-50%, mild RV dysfunction, mild MR - Lactic acid 1.7>2.3. Will trend - NYHA IV on admission - volume mildly elevated but difficult to see, will give 80 IV lasix BID. Follow response - Place PICC, suspect she would not be HD candidate - Follow CVP and co-ox - Decrease DBA 5>2.5 mcg/kg/min - GDMT limited by CKD / hypotension  - strict I&O, daily weights  2. Acute respiratory failure - Intubated. Plan for extubation today - per primary  3. Bradycardia - stable in 50s - Keep off BB   4. Atrial fibrillation - chemically converted with amiodarone>>SB - Continue Heparin gtt, previously on eliquis - Off amiodarone/BB with bradycardia - Remains in SB, 40s-50s  5. AKI on CKD III - SCr baseline ~1 - SCr today 2.8 - follow with diuresis - avoid hypotension - suspect low output/cardiorenal   6. Hyperlipidemia - check lipid panel - Statin when AST/ALT improves  7. Hypothyroid - check free T3 - previously on synthroid - started amio 09/26/22 - 08/26/22 TSH 3.6, T4 1.27 - 10/06/22 TSH 11.3, T4 1.7   Length of Stay: 1  Alen Bleacher, NP  10/07/2022, 7:54 AM  Advanced Heart Failure Team Pager [phone] (M-F; 7a - 5p)  Please contact [facility] for night-coverage after hours (4p -7a ) and weekends on [email or web address]   Patient seen with NP, agree with the above note.   History as noted above.  Had admission in 8/24 with AF/RVR and newly-noted HF with mid-range EF.  She was diuresed and started on amiodarone, and plan was for outpatient cardioversion.  She was readmitted on 9/29 with CHF exacerbation and pulmonary edema on CXR.  She was noted to be in sinus bradycardia, rate in 40s. Echo was done again this admission, by my review EF 50%, mild LVH, normal RV, mild AI,  mild-moderate MR, mild-moderate TR.  Creatinine was up to 2.28, she was initially started on Bipap then intubated.  With sinus brady, amiodarone and metoprolol were stopped.   She was started on NE, this was weaned off and she was started on dobutamine 5.  Today, she is awake on vent with NSR in 50s.  Lactate, of note, was mildly elevated at 2.3.   CXR today with pulmonary edema.  She is on ceftriaxone but PCT was < 0.1.   General: Awake on vent Neck: JVP difficult, no thyromegaly or thyroid nodule.  Lungs: Decreased at bases.  CV: Nondisplaced PMI.  Heart regular S1/S2, no S3/S4, no murmur.  No peripheral edema.  No carotid bruit.  Normal pedal pulses.  Abdomen: Soft, nontender, no hepatosplenomegaly, no distention.  Skin: Intact without lesions or rashes.  Neurologic: Follows commands.  Extremities: No clubbing or cyanosis.  HEENT: Normal.   Assessment/Plan: 1. Atrial fibrillation: She has been in sinus bradycardia since admission, HR in 40s initially, now in 50s.  She is on dobutamine 5.  - Continue heparin gtt for now, eventually back to Eliquis.  - No nodal blockade for now, may eventually be able to tolerate low dose amiodarone but will have to see.  - Will cut back dobutamine to 2.5 mcg/kg/min for now and follow.  2. Acute on chronic HFmREF: I reviewed echo done this admission, this showed EF 50%, mild LVH, normal RV, mild AI, mild-moderate MR, mild-moderate TR.  She was admitted with volume overload/pulmonary edema, BNP 1298 and AKI. This morning, creatinine up to 2.8.  She had lactate up to 2.3 in setting of hypotension/bradycardia/hypoxemia, now down to 1.5 this morning.  She is on dobutamine 5 in setting of bradycardia, NE has been weaned off. HR in 50s. Volume status difficult to assess on exam, still with pulmonary edema on CXR.   - Decrease dobutamine to 2.5 and leave there for now.  Follow HR/BP.   - Will give Lasix 80 mg IV x 1 now.  - Place PICC to follow CVP/co-ox to tailor diuresis and dobutamine use more effectively.  She is elderly/frail, poor dialysis candidate so despite elevated creatinine think it is ok to place PICC.  3. AKI on CKD stage 3:  Baseline creatinine looks to be around 1.3.  Up to 2.8 in setting of hypotension/bradycardia.  Possible ATN.  Hopefully this will improve with time and maintenance of adequate BP.   4. Abnormal thyroid panel: TSH mildly elevated at 11, free T4 also elevated.   Mixed picture, ?contribution to bradycardia.  Per CCM.  5. Acute hypoxemic respiratory failure: In setting of pulmonary edema.  With PCT <0.1, doubt PNA.  - Careful diuresis.  - Currently on ceftriaxone.  6. Valvular heart disease: Echo this admission less impressive with mild-moderate MR and mild-moderate TR.   Marca Ancona 10/07/2022 10:59 AM

## 2022-10-07 NOTE — Consult Note (Signed)
PHARMACY CONSULT NOTE - ELECTROLYTES  Pharmacy Consult for Electrolyte Monitoring and Replacement   Recent Labs: Height: 5\' 3"  (160 cm) Weight: 70.8 kg (156 lb 1.4 oz) IBW/kg (Calculated) : 52.4 Estimated Creatinine Clearance: 13.4 mL/min (A) (by C-G formula based on SCr of 2.8 mg/dL (H)). Potassium (mmol/L)  Date Value  10/07/2022 4.2  06/06/2011 4.0   Magnesium (mg/dL)  Date Value  40/98/1191 2.1   Calcium (mg/dL)  Date Value  47/82/9562 8.8 (L)   Calcium, Total (mg/dL)  Date Value  13/08/6576 9.3   Albumin (g/dL)  Date Value  46/96/2952 3.1 (L)  10/07/2022 3.0 (L)  06/06/2011 3.6   Phosphorus (mg/dL)  Date Value  84/13/2440 4.5   Sodium (mmol/L)  Date Value  10/07/2022 135  09/26/2022 134  06/06/2011 138    Assessment  Nichole Hess is a 89 y.o. female presenting with shortness of breath. PMH significant for HTN, Ao atherosclerosis, hypothyroidism, and CKD III, HFmrEF, Atrial Fibrillation on Eliquis, and HLD  . Pharmacy has been consulted to monitor and replace electrolytes.  Diet: NPO Pertinent medications: Furosemide 80mg  IV x 1  Goal of Therapy: Electrolytes WNL  Plan:  No replacement indicated Check BMP, Mg, Phos with AM labs  Thank you for allowing pharmacy to be a part of this patient's care.  Bettey Costa, PharmD Clinical Pharmacist 10/07/2022 7:43 AM

## 2022-10-07 NOTE — Consult Note (Signed)
ANTICOAGULATION CONSULT NOTE  Pharmacy Consult for Heparin Indication: atrial fibrillation  No Known Allergies  Patient Measurements: Height: 5\' 3"  (160 cm) Weight: 70.8 kg (156 lb 1.4 oz) IBW/kg (Calculated) : 52.4 Heparin Dosing Weight: 67.9 kg  Vital Signs: Temp: 97.7 F (36.5 C) (10/01 0849) Temp Source: Axillary (10/01 0849) BP: 110/82 (10/01 1300) Pulse Rate: 47 (10/01 1300)  Labs: Recent Labs    10/05/22 2257 10/06/22 0106 10/06/22 0516 10/06/22 1111 10/06/22 2049 10/07/22 0408 10/07/22 0828  HGB 14.4  --  14.2  --   --  13.0  --   HCT 45.0  --  45.0  --   --  37.7  --   PLT 263  --  258  --   --  231  --   APTT  --  38*  --   --  >200*  --  126*  LABPROT 25.2*  --   --  28.8*  --  26.6*  --   INR 2.3*  --   --  2.7*  --  2.4*  --   HEPARINUNFRC  --   --   --   --   --   --  >1.10*  CREATININE 2.28*  --  2.38*  --   --  2.80*  --   TROPONINIHS 32* 32*  --   --   --   --   --     Estimated Creatinine Clearance: 13.4 mL/min (A) (by C-G formula based on SCr of 2.8 mg/dL (H)).   Medical History: Past Medical History:  Diagnosis Date   Breast cancer (HCC) 06/05/1999   lumpectomy-positive   Hypertension    Personal history of radiation therapy    Thyroid disease     Pertinent Medications:  Apixaban 5mg  BID, last dose: 9/29@2000   Assessment: 89 y.o female with significant PMH of HTN, Ao atherosclerosis, hypothyroidism, and CKD III, HFmrEF, Atrial Fibrillation on Eliquis, and HLD who presented to the ED with chief complaints of progressive shortness of breath. Pharmacy has been consulted to initiate and monitor heparin infusion.  Baseline labs: aPTT 38sec, INR 2.7, Hgb 14.2, Plts 258  Goal of Therapy:  Heparin level 0.3-0.7 units/ml aPTT 66-102 seconds Monitor platelets by anticoagulation protocol: Yes  09/30 2049 aPTT > 200 10/01 0828 aPTT 126, HL>1.10, supratherapeutic   Plan: Given elevated INR and high risk for GIB, will avoid  boluses Decrease heparin infusion at 550 units/hr Check aPTT level in 8 hours after rate change Will use aPTT to guide dosing until HL correlates, then will transition to HL dosing Continue to monitor H&H and platelets  Bettey Costa, PharmD Clinical Pharmacist 10/07/2022 2:02 PM

## 2022-10-07 NOTE — Progress Notes (Signed)
Peripherally Inserted Central Catheter Placement  The IV Nurse has discussed with the patient and/or persons authorized to consent for the patient, the purpose of this procedure and the potential benefits and risks involved with this procedure.  The benefits include less needle sticks, lab draws from the catheter, and the patient may be discharged home with the catheter. Risks include, but not limited to, infection, bleeding, blood clot (thrombus formation), and puncture of an artery; nerve damage and irregular heartbeat and possibility to perform a PICC exchange if needed/ordered by physician.  Alternatives to this procedure were also discussed.  Bard Power PICC patient education guide, fact sheet on infection prevention and patient information card has been provided to patient /or left at bedside.    PICC Placement Documentation  PICC Triple Lumen 10/07/22 Right Basilic 37 cm 0 cm (Active)  Indication for Insertion or Continuance of Line Vasoactive infusions 10/07/22 1130  Exposed Catheter (cm) 0 cm 10/07/22 1130  Site Assessment Clean, Dry, Intact 10/07/22 1130  Lumen #1 Status Flushed;Saline locked;Blood return noted 10/07/22 1130  Lumen #2 Status Flushed;Saline locked;Blood return noted 10/07/22 1130  Lumen #3 Status Flushed;Saline locked;Blood return noted 10/07/22 1130  Dressing Type Transparent;Securing device 10/07/22 1130  Dressing Status Antimicrobial disc in place;Clean, Dry, Intact 10/07/22 1130  Line Care Connections checked and tightened 10/07/22 1130  Line Adjustment (NICU/IV Team Only) No 10/07/22 1130  Dressing Intervention New dressing;Adhesive placed at insertion site (IV team only);Other (Comment) 10/07/22 1130  Dressing Change Due 10/14/22 10/07/22 1130    Patient's daughter, Charlaine Dalton, signed PICC consent, via telephone. Verified by 2 PICC RNs.   Annett Fabian 10/07/2022, 11:31 AM

## 2022-10-07 NOTE — Progress Notes (Signed)
NAME:  Nichole Hess, MRN:  [id number], DOB:  1933-03-14, LOS: 1 ADMISSION DATE:  10/05/2022, CONSULTATION DATE:  10/06/22 REFERRING MD: Loleta Rose CHIEF COMPLAINT: SOB   HPI  89 y.o female with significant PMH of HTN, Ao atherosclerosis, hypothyroidism, and CKD III, HFmrEF, Atrial Fibrillation on Eliquis, and HLD who presented to the ED with chief complaints of progressive shortness of breath.  On review of the chart, patient was hospitalized at [facility] on 8/20 to 8/22 for new onset atrial fibrillation with RVR and acute HFmrEF in the setting of A-fib with RVR.  She was started on beta-blocker, Eliquis and discharged on Lasix.  Since discharge she followed with her cardiologist on 09/12/2022 who continued her metoprolol and Lasix but held Coreg, losartan and amlodipine due to hypotension.  Patient returned to the ED on 9/17 with shortness of breath and weight gain.  She was again diuresed and discharged home to follow-up with her cardiologist.  Patient saw her cardiologist again on 09/26/2022 who recommended starting amiodarone for rate control and possible cardioversion if she remained in A-fib.  She was also continued Eliquis 5 mg twice daily and her Lasix increased to 240 mg daily an additional 40 in the afternoon for shortness of breath or abdominal swelling.  Patient's developed worsening shortness of breath yesterday afternoon with associated orthopnea and increased work of breathing which prompted her to come to the ED. Denies fevers, chills, n/v or chest pain.   10/07/22- patient failed weaning extubation trial with apnea.  We will perform CPAP trials today for chest physiotherapy.  Patient remains on Dobutamine drip.    Past Medical History  HTN, Ao atherosclerosis, hypothyroidism, and CKD III, HFmrEF, Atrial Fibrillation on Eliquis, and HLD   Significant Hospital Events   9/30: Admit to ICU with Acute on Chronic Respiratory Failure secondary to CHF exacerbation, failed BiPAP requiring  intubation.  Consults:  Cardiology  Procedures:  9/30: Intubation  Significant Diagnostic Tests:  9/29: Chest Xray> IMPRESSION: COPD. Small bilateral effusions with bibasilar atelectasis. Heart mildly enlarged with interstitial prominence which may reflect interstitial edema.  Interim History / Subjective:    -  Micro Data:  9/29: SARS-CoV-2 PCR> negative 9/29: Influenza PCR> negative 9/30: Blood culture x2> 9/30: MRSA PCR>>  9/30: Strep pneumo urinary antigen> 9/30: Legionella urinary antigen>  Antimicrobials:  None  OBJECTIVE  Blood pressure (!) 94/46, pulse (!) 45, temperature 97.7 F (36.5 C), temperature source Axillary, resp. rate 18, height 5\' 3"  (1.6 m), weight 70.8 kg, SpO2 96%.    Vent Mode: PRVC FiO2 (%):  [28 %-80 %] 28 % Set Rate:  [18 bmp] 18 bmp Vt Set:  [450 mL] 450 mL PEEP:  [5 cmH20] 5 cmH20 Plateau Pressure:  [15 cmH20-16 cmH20] 16 cmH20   Intake/Output Summary (Last 24 hours) at 10/07/2022 1023 Last data filed at 10/07/2022 0849 Gross per 24 hour  Intake 925.63 ml  Output 906 ml  Net 19.63 ml   Filed Weights   10/05/22 2222 10/06/22 0500 10/07/22 0354  Weight: 73.4 kg 73.4 kg 70.8 kg    Physical Examination  GENERAL: 89 year-old critically ill patient lying in the bed intubated and Sedated EYES: PEERLA. No scleral icterus. Extraocular muscles intact.  HEENT: Head atraumatic, normocephalic. Oropharynx and nasopharynx clear.  NECK:  No JVD, supple  LUNGS: Decreased breath sounds with moderate wheezing bilaterally.  Mild use of accessory muscles of respiration.  CARDIOVASCULAR: S1, S2 normal. No murmurs, rubs, or gallops.  ABDOMEN: Soft, NTND EXTREMITIES:  trace bilateral lower extremity edema. Capillary refill < 3 seconds in all extremities. Pulses palpable distally. NEUROLOGIC: The patient is intubated and sedated. No focal neurological deficit appreciated. Cranial nerves are intact.  SKIN: No obvious rash, lesion, or ulcer. Warm to  touch Labs/imaging that I havepersonally reviewed  (right click and "Reselect all SmartList Selections" daily)     Labs   CBC: Recent Labs  Lab 10/05/22 2257 10/06/22 0516 10/07/22 0408  WBC 8.1 12.6* 12.4*  NEUTROABS 5.3  --   --   HGB 14.4 14.2 13.0  HCT 45.0 45.0 37.7  MCV 95.1 95.5 87.5  PLT 263 258 231   Basic Metabolic Panel: Recent Labs  Lab 10/05/22 2257 10/06/22 0516 10/07/22 0408  NA 132* 133* 135  K 4.8 5.1 4.2  CL 95* 98 98  CO2 26 23 26   GLUCOSE 136* 135* 148*  BUN 30* 32* 42*  CREATININE 2.28* 2.38* 2.80*  CALCIUM 9.5 9.2 8.8*  MG  --   --  2.1  PHOS  --   --  4.5   GFR: Estimated Creatinine Clearance: 13.4 mL/min (A) (by C-G formula based on SCr of 2.8 mg/dL (H)). Recent Labs  Lab 10/05/22 2257 10/06/22 0106 10/06/22 0516 10/07/22 0408 10/07/22 0922  PROCALCITON  --  <0.10  --   --   --   WBC 8.1  --  12.6* 12.4*  --   LATICACIDVEN 1.7 2.3*  --   --  1.5    Liver Function Tests: Recent Labs  Lab 10/05/22 2257 10/07/22 0408  AST 26 96*  ALT 30 112*  ALKPHOS 59 49  BILITOT 1.3* 1.1  PROT 7.0 6.0*  ALBUMIN 3.8 3.0*  3.1*   No results for input(s): "LIPASE", "AMYLASE" in the last 168 hours. No results for input(s): "AMMONIA" in the last 168 hours.  ABG    Component Value Date/Time   PHART 7.37 10/06/2022 0501   PCO2ART 42 10/06/2022 0501   PO2ART 51 (L) 10/06/2022 0501   HCO3 24.3 10/06/2022 0501   ACIDBASEDEF 1.1 10/06/2022 0501   O2SAT 79.8 10/06/2022 0501     Coagulation Profile: Recent Labs  Lab 10/05/22 2257 10/06/22 1111 10/07/22 0408  INR 2.3* 2.7* 2.4*   Cardiac Enzymes: No results for input(s): "CKTOTAL", "CKMB", "CKMBINDEX", "TROPONINI" in the last 168 hours.  HbA1C: Hgb A1c MFr Bld  Date/Time Value Ref Range Status  08/27/2022 05:25 AM 5.6 4.8 - 5.6 % Final    Comment:    (NOTE) Pre diabetes:          5.7%-6.4%  Diabetes:              >6.4%  Glycemic control for   <7.0% adults with diabetes      CBG: Recent Labs  Lab 10/06/22 1641 10/06/22 1948 10/06/22 2342 10/07/22 0347 10/07/22 0732  GLUCAP 152* 147* 149* 127* 138*    Review of Systems:   Unable to be obtained secondary to the patient's intubated and sedated status.   Past Medical History  She,  has a past medical history of Breast cancer (HCC) (06/05/1999), Hypertension, Personal history of radiation therapy, and Thyroid disease.   Surgical History    Past Surgical History:  Procedure Laterality Date   BREAST BIOPSY Left 06/05/99   lumpectomy/rad   BREAST LUMPECTOMY Left 2001   CHOLECYSTECTOMY       Social History   reports that she quit smoking about 54 years ago. Her smoking use included cigarettes. She started smoking  about 64 years ago. She has a 10 pack-year smoking history. She has never used smokeless tobacco. She reports that she does not drink alcohol and does not use drugs.   Family History   Her family history includes Heart attack (age of onset: 56) in her father; Hypertension in her mother; Other in her mother. There is no history of Breast cancer.   Allergies No Known Allergies   Home Medications  Prior to Admission medications   Medication Sig Start Date End Date Taking? Authorizing Provider  amiodarone (PACERONE) 400 MG tablet Take 1 tablet (400 mg total) by mouth 2 (two) times daily. Take for 7 days. 09/26/22  Yes Gollan, Tollie Pizza, MD  apixaban (ELIQUIS) 5 MG TABS tablet Take 1 tablet (5 mg total) by mouth 2 (two) times daily. 08/29/22  Yes Arnetha Courser, MD  fluticasone (FLONASE) 50 MCG/ACT nasal spray Place 2 sprays into both nostrils daily.   Yes [provider]  furosemide (LASIX) 20 MG tablet Take 2 tablets (40 mg total) by mouth daily. May take an additional 40 mg ( 1 tablet) daily as needed for shortness of breath or abdominal swelling. 09/26/22  Yes Gollan, Tollie Pizza, MD  gabapentin (NEURONTIN) 300 MG capsule Take 300 mg by mouth at bedtime.   Yes [provider]   levothyroxine (SYNTHROID) 75 MCG tablet Take 75 mcg by mouth daily before breakfast.   Yes [provider]  metoprolol tartrate (LOPRESSOR) 25 MG tablet Take 1 tablet (25 mg total) by mouth 2 (two) times daily. 08/29/22  Yes Arnetha Courser, MD  Multiple Vitamin (MULTI-VITAMINS) TABS Take 1 tablet by mouth daily.   Yes [provider]  potassium chloride (KLOR-CON) 10 MEQ tablet Take 1 tablet (10 mEq total) by mouth daily. May take an additional dose 10 Meq ( 1 tablet) as needed with extra lasix. 09/26/22 12/25/22 Yes Gollan, Tollie Pizza, MD  pravastatin (PRAVACHOL) 40 MG tablet Take 40 mg by mouth at bedtime.   Yes [provider]  amiodarone (PACERONE) 200 MG tablet Take 1 tablet (200 mg total) by mouth 2 (two) times daily. Start taking after completing 7 days of 400 mg twice daily. 10/05/22   Antonieta Iba, MD  Scheduled Meds:  Chlorhexidine Gluconate Cloth  6 each Topical Daily   docusate  100 mg Per Tube BID   famotidine  20 mg Per Tube QHS   fentaNYL (SUBLIMAZE) injection  100 mcg Intravenous Once   free water  30 mL Per Tube Q4H   hydrocortisone sod succinate (SOLU-CORTEF) inj  50 mg Intravenous Q8H   ipratropium-albuterol  3 mL Nebulization Q6H   mouth rinse  15 mL Mouth Rinse Q2H   polyethylene glycol  17 g Per Tube Daily   Continuous Infusions:  sodium chloride 250 mL (10/07/22 1000)   cefTRIAXone (ROCEPHIN)  IV 1 g (10/07/22 1007)   DOBUTamine 2.5 mcg/kg/min (10/07/22 0944)   feeding supplement (VITAL AF 1.2 CAL) 50 mL/hr at 10/07/22 0100   fentaNYL infusion INTRAVENOUS 90 mcg/hr (10/07/22 0600)   heparin 700 Units/hr (10/07/22 0600)   norepinephrine (LEVOPHED) Adult infusion Stopped (10/07/22 0401)   PRN Meds:.atropine, docusate sodium, fentaNYL, ipratropium-albuterol, morphine injection, mouth rinse, polyethylene glycol  Active Hospital Problem list   See systems below  Assessment & Plan:  Symptomatic Bradycardia #Low output Cardiogenic  shock #Acute on Chronic Decompensated HF~BNP significantly elevated #Atrial Fibrillation PMHx: HTN, HLD -EF 45-50% by echo on 08/27/22 -Unclear precipitating factor however TSH is significantly elevated maybe worsening  symptoms -Cautious IV fluids -Vasopressors for MAP goal>65 -Trend lactic acid until normalized -Trend HS Troponin until peaked  -BNP is 1298 -Check Coox and CVP once central line placed -Echocardiogram -Hold Amiodarone and Metoprolol for now -Diuresis as BP and renal function permits ~ holding due to shock and AKI -Cardiology consulted -Hold Eliquis while NPO and start Heparin   #Acute Hypoxic Respiratory Failure in setting of Acute Decompensated HF, bilateral pleural effusions -Full vent support, implement lung protective strategies -Plateau pressures less than 30 cm H20 -Wean FiO2 & PEEP as tolerated to maintain O2 sats >92% -Follow intermittent Chest X-ray & ABG as needed -Spontaneous Breathing Trials when respiratory parameters met and mental status permits -Implement VAP Bundle -Prn Bronchodilators   #Acute Kidney Injury on CKD stage III #Lactic Acidosis -Monitor I&O's / urinary output -Follow BMP -Ensure adequate renal perfusion -Avoid nephrotoxic agents as able -Replace electrolytes as indicated    URINARY TRACT INFECTION     -on rocephin     #Sedation needs in setting of mechanical ventilation -Maintain a RASS goal of 0 to -1 -Propofol if needed to maintain RASS goal ~ currently on NO sedation -Avoid sedating medications as able -Daily wake up assessment  #Severe Hypothyroidism Hx of Hypothyroidism on Synthroid likely primary in the setting of poorly controlled hypothyroid patient. Patient's daughter reports  she may have been missing her doses of Synthroid  Na 125, Glu144, T4,<0.25, TSH 200.017:AMS (lethargy), hypothermia, hypotension, s/s of HF -hydrocortisone 100mg  q8h  -check cortisol level -Levothyroxine (T4) Loading dose 75 mcg IV followed  by 50-100 mcg IV QD until able to take T4 orally.  -Avoid aggressive IVFs   Best practice:  Diet:  NPO Pain/Anxiety/Delirium protocol (if indicated): Yes (RASS goal -1) VAP protocol (if indicated): Yes DVT prophylaxis: Systemic AC GI prophylaxis: N/A Glucose control:  SSI Yes Central venous access:  N/A Arterial line:  N/A Foley:  Yes, and it is still needed Mobility:  bed rest  PT consulted: N/A Last date of multidisciplinary goals of care discussion [9/30] Code Status:  full code Disposition: ICU   = Goals of Care = Code Status Order: FULL  Primary Emergency Contact: JEFFRIES,SHERRY Patient's dauighter who is her POA Wishes to pursue full aggressive treatment and intervention options, including CPR and intubation  Critical care provider statement:   Total critical care time: 33 minutes   Performed by: Karna Christmas MD   Critical care time was exclusive of separately billable procedures and treating other patients.   Critical care was necessary to treat or prevent imminent or life-threatening deterioration.   Critical care was time spent personally by me on the following activities: development of treatment plan with patient and/or surrogate as well as nursing, discussions with consultants, evaluation of patient's response to treatment, examination of patient, obtaining history from patient or surrogate, ordering and performing treatments and interventions, ordering and review of laboratory studies, ordering and review of radiographic studies, pulse oximetry and re-evaluation of patient's condition.    Vida Rigger, M.D.  Pulmonary & Critical Care Medicine

## 2022-10-07 NOTE — Progress Notes (Signed)
Updated pt's daughter Charlaine Dalton via telephone on plan of care.   All questions answered, she is appreciative of update.     Harlon Ditty, AGACNP-BC [facility] Prefer epic messenger for cross cover needs If after hours, please call E-link

## 2022-10-07 NOTE — Consult Note (Cosign Needed Addendum)
Consultation Note Date: 10/07/2022   Patient Name: Nichole Hess  DOB: November 22, 1933  MRN: [id number]  Age / Sex: 89 y.o., female  PCP: Luciana Axe, NP Referring Physician: Vida Rigger, MD  Reason for Consultation: Establishing goals of care  HPI/Patient Profile: PER EMR H&P, 89 y.o female with significant PMH of HTN, Ao atherosclerosis, hypothyroidism, and CKD III, HFmrEF, Atrial Fibrillation on Eliquis, and HLD who presented to the ED with chief complaints of progressive shortness of breath.   On review of the chart, patient was hospitalized at [facility] on 8/20 to 8/22 for new onset atrial fibrillation with RVR and acute HFmrEF in the setting of A-fib with RVR.  She was started on beta-blocker, Eliquis and discharged on Lasix.  Since discharge she followed with her cardiologist on 09/12/2022 who continued her metoprolol and Lasix but held Coreg, losartan and amlodipine due to hypotension.  Patient returned to the ED on 9/17 with shortness of breath and weight gain.  She was again diuresed and discharged home to follow-up with her cardiologist.  Patient saw her cardiologist again on 09/26/2022 who recommended starting amiodarone for rate control and possible cardioversion if she remained in A-fib.  She was also continued Eliquis 5 mg twice daily and her Lasix increased to 240 mg daily an additional 40 in the afternoon for shortness of breath or abdominal swelling.  Patient's developed worsening shortness of breath yesterday afternoon with associated orthopnea and increased work of breathing which prompted her to come to the ED. Denies fevers, chills, n/v or chest pain.  Clinical Assessment and Goals of Care: Notes and labs reviewed including notes and diagnostics from previous admission. In to see patient. She is currently on the ventilator. She opens her eyes upon speaking to her. She shakes and nods her head in  response to questions. She indicates she is divorced and has 1 child.   With inquiring about reintubation, patient indicates she would want to be reintubated if needed. With asking about CPR, she tries to speak. Unable to make out what she is saying. Inquired a second time, and patient does not answer with a shake or nod but again attempts to speak.   Nursing entered room. Per nursing, they are hoping to extubate today, soon. Patient indicates understanding of this.  Updated CCM regarding desire for reintubation. Per CCM, will extubate patient to BIPAP as soon as RT is available.  PMT will follow up tomorrow.     SUMMARY OF RECOMMENDATIONS   PMT will follow   Prognosis:  Unable to determine       Primary Diagnoses: Present on Admission:  Acute on chronic respiratory failure with hypoxemia (HCC)   I have reviewed the medical record, interviewed the patient and family, and examined the patient. The following aspects are pertinent.  Past Medical History:  Diagnosis Date   Breast cancer (HCC) 06/05/1999   lumpectomy-positive   Hypertension    Personal history of radiation therapy    Thyroid disease    Social History   Socioeconomic  History   Marital status: Divorced    Spouse name: Not on file   Number of children: Not on file   Years of education: Not on file   Highest education level: Not on file  Occupational History   Not on file  Tobacco Use   Smoking status: Former    Current packs/day: 0.00    Average packs/day: 1 pack/day for 10.0 years (10.0 ttl pk-yrs)    Types: Cigarettes    Start date: 10/14/1958    Quit date: 10/13/1968    Years since quitting: 54.0   Smokeless tobacco: Never  Vaping Use   Vaping status: Never Used  Substance and Sexual Activity   Alcohol use: No   Drug use: No   Sexual activity: Not on file  Other Topics Concern   Not on file  Social History Narrative   Lives with daughterHenreitta Leber - [phone] ( mobile ).   Social  Determinants of Health   Financial Resource Strain: Low Risk  (07/30/2022)   Received from [facility]   Overall Financial Resource Strain (CARDIA)    Difficulty of Paying Living Expenses: Not hard at all  Food Insecurity: No Food Insecurity (08/27/2022)   Hunger Vital Sign    Worried About Running Out of Food in the Last Year: Never true    Ran Out of Food in the Last Year: Never true  Transportation Needs: No Transportation Needs (08/27/2022)   PRAPARE - Administrator, Civil Service (Medical): No    Lack of Transportation (Non-Medical): No  Physical Activity: Not on file  Stress: Not on file  Social Connections: Not on file   Family History  Problem Relation Age of Onset   Other Mother        sepsis   Hypertension Mother    Heart attack Father 66   Breast cancer Neg Hx    Scheduled Meds:  albuterol  2.5 mg Nebulization Q6H   Chlorhexidine Gluconate Cloth  6 each Topical Daily   docusate  100 mg Per Tube BID   famotidine  20 mg Per Tube QHS   fentaNYL (SUBLIMAZE) injection  100 mcg Intravenous Once   free water  30 mL Per Tube Q4H   furosemide  80 mg Intravenous Once   hydrocortisone sod succinate (SOLU-CORTEF) inj  50 mg Intravenous Q8H   mouth rinse  15 mL Mouth Rinse Q2H   polyethylene glycol  17 g Per Tube Daily   sodium chloride flush  10-40 mL Intracatheter Q12H   Continuous Infusions:  sodium chloride Stopped (10/07/22 1056)   cefTRIAXone (ROCEPHIN)  IV Stopped (10/07/22 1037)   DOBUTamine 2.5 mcg/kg/min (10/07/22 1300)   feeding supplement (VITAL AF 1.2 CAL) Stopped (10/07/22 0810)   heparin 550 Units/hr (10/07/22 1300)   norepinephrine (LEVOPHED) Adult infusion Stopped (10/07/22 0401)   PRN Meds:.albuterol, atropine, docusate sodium, fentaNYL (SUBLIMAZE) injection, fentaNYL (SUBLIMAZE) injection, midazolam, mouth rinse, polyethylene glycol, sodium chloride flush Medications Prior to Admission:  Prior to Admission medications    Medication Sig Start Date End Date Taking? Authorizing Provider  amiodarone (PACERONE) 400 MG tablet Take 1 tablet (400 mg total) by mouth 2 (two) times daily. Take for 7 days. 09/26/22  Yes Gollan, Tollie Pizza, MD  apixaban (ELIQUIS) 5 MG TABS tablet Take 1 tablet (5 mg total) by mouth 2 (two) times daily. 08/29/22  Yes Arnetha Courser, MD  fluticasone (FLONASE) 50 MCG/ACT nasal spray Place 2 sprays into both nostrils daily.  Yes [provider]  furosemide (LASIX) 20 MG tablet Take 2 tablets (40 mg total) by mouth daily. May take an additional 40 mg ( 1 tablet) daily as needed for shortness of breath or abdominal swelling. 09/26/22  Yes Gollan, Tollie Pizza, MD  gabapentin (NEURONTIN) 300 MG capsule Take 300 mg by mouth at bedtime.   Yes [provider]  levothyroxine (SYNTHROID) 75 MCG tablet Take 75 mcg by mouth daily before breakfast.   Yes [provider]  metoprolol tartrate (LOPRESSOR) 25 MG tablet Take 1 tablet (25 mg total) by mouth 2 (two) times daily. 08/29/22  Yes Arnetha Courser, MD  Multiple Vitamin (MULTI-VITAMINS) TABS Take 1 tablet by mouth daily.   Yes [provider]  potassium chloride (KLOR-CON) 10 MEQ tablet Take 1 tablet (10 mEq total) by mouth daily. May take an additional dose 10 Meq ( 1 tablet) as needed with extra lasix. 09/26/22 12/25/22 Yes Gollan, Tollie Pizza, MD  pravastatin (PRAVACHOL) 40 MG tablet Take 40 mg by mouth at bedtime.   Yes [provider]  amiodarone (PACERONE) 200 MG tablet Take 1 tablet (200 mg total) by mouth 2 (two) times daily. Start taking after completing 7 days of 400 mg twice daily. 10/05/22   Antonieta Iba, MD   No Known Allergies Review of Systems  Unable to perform ROS   Physical Exam Pulmonary:     Comments: On ventilator.  Neurological:     Mental Status: She is alert.     Comments: Awake and shakes and nods head to questions.      Vital Signs: BP 135/80   Pulse (!) 50   Temp 97.7 F (36.5 C)  (Axillary)   Resp 18   Ht 5\' 3"  (1.6 m)   Wt 70.8 kg   SpO2 94%   BMI 27.65 kg/m  Pain Scale: CPOT   Pain Score: 0-No pain   SpO2: SpO2: 94 % O2 Device:SpO2: 94 % O2 Flow Rate: .O2 Flow Rate (L/min): 2 L/min  IO: Intake/output summary:  Intake/Output Summary (Last 24 hours) at 10/07/2022 1520 Last data filed at 10/07/2022 1436 Gross per 24 hour  Intake 1246.96 ml  Output 1145 ml  Net 101.96 ml    LBM: Last BM Date :  (PTA) Baseline Weight: Weight: 73.4 kg Most recent weight: Weight: 70.8 kg     Palliative Assessment/Data:   Signed by: Morton Stall, NP   Please contact Palliative Medicine Team phone at [phone] for questions and concerns.  For individual provider: See Loretha Stapler

## 2022-10-07 NOTE — Progress Notes (Signed)
Patient experienced emesis when stimulated during assessment. Airway suction performed at this time. No changes noted in vitals.

## 2022-10-07 NOTE — Progress Notes (Signed)
Pt. Is extubated to bipap without incident,no apparent resp. Distress noted at this time.

## 2022-10-07 NOTE — Consult Note (Signed)
ANTICOAGULATION CONSULT NOTE  Pharmacy Consult for Heparin Indication: atrial fibrillation  No Known Allergies  Patient Measurements: Height: 5\' 3"  (160 cm) Weight: 70.8 kg (156 lb 1.4 oz) IBW/kg (Calculated) : 52.4 Heparin Dosing Weight: 67.9 kg  Vital Signs: Temp: 97.6 F (36.4 C) (10/01 1900) Temp Source: Axillary (10/01 1900) BP: 103/55 (10/01 1900) Pulse Rate: 51 (10/01 1900)  Labs: Recent Labs    10/05/22 2257 10/05/22 2257 10/06/22 0106 10/06/22 0516 10/06/22 1111 10/06/22 2049 10/07/22 0408 10/07/22 0828 10/07/22 1928  HGB 14.4  --   --  14.2  --   --  13.0  --   --   HCT 45.0  --   --  45.0  --   --  37.7  --   --   PLT 263  --   --  258  --   --  231  --   --   APTT  --    < > 38*  --   --  >200*  --  126* 68*  LABPROT 25.2*  --   --   --  28.8*  --  26.6*  --   --   INR 2.3*  --   --   --  2.7*  --  2.4*  --   --   HEPARINUNFRC  --   --   --   --   --   --   --  >1.10*  --   CREATININE 2.28*  --   --  2.38*  --   --  2.80*  --   --   TROPONINIHS 32*  --  32*  --   --   --   --   --   --    < > = values in this interval not displayed.    Estimated Creatinine Clearance: 13.4 mL/min (A) (by C-G formula based on SCr of 2.8 mg/dL (H)).   Medical History: Past Medical History:  Diagnosis Date   Breast cancer (HCC) 06/05/1999   lumpectomy-positive   Hypertension    Personal history of radiation therapy    Thyroid disease     Pertinent Medications:  Apixaban 5mg  BID, last dose: 9/29@2000   Assessment: 89 y.o female with significant PMH of HTN, Ao atherosclerosis, hypothyroidism, and CKD III, HFmrEF, Atrial Fibrillation on Eliquis, and HLD who presented to the ED with chief complaints of progressive shortness of breath. Pharmacy has been consulted to initiate and monitor heparin infusion.  Baseline labs: aPTT 38sec, INR 2.7, Hgb 14.2, Plts 258  Goal of Therapy:  Heparin level 0.3-0.7 units/ml aPTT 66-102 seconds Monitor platelets by anticoagulation  protocol: Yes  09/30 2049 aPTT > 200 10/01 0828 aPTT 126, HL>1.10, supratherapeutic 10/01 1928 aPTT 68 sec, therapeutic x 1   Plan: aPTT therapeutic x 1 Given elevated INR and high risk for GIB, will avoid boluses Continue heparin infusion at 550 units/hr Check aPTT level in 8 hours to confirm Will use aPTT to guide dosing until HL correlates, then will transition to HL dosing Continue to monitor H&H and platelets  Barrie Folk, PharmD Clinical Pharmacist 10/07/2022 8:03 PM

## 2022-10-08 DIAGNOSIS — J9621 Acute and chronic respiratory failure with hypoxia: Secondary | ICD-10-CM | POA: Diagnosis not present

## 2022-10-08 DIAGNOSIS — I5033 Acute on chronic diastolic (congestive) heart failure: Secondary | ICD-10-CM | POA: Diagnosis not present

## 2022-10-08 DIAGNOSIS — Z7189 Other specified counseling: Secondary | ICD-10-CM | POA: Diagnosis not present

## 2022-10-08 LAB — GLUCOSE, CAPILLARY
Glucose-Capillary: 101 mg/dL — ABNORMAL HIGH (ref 70–99)
Glucose-Capillary: 102 mg/dL — ABNORMAL HIGH (ref 70–99)
Glucose-Capillary: 115 mg/dL — ABNORMAL HIGH (ref 70–99)
Glucose-Capillary: 116 mg/dL — ABNORMAL HIGH (ref 70–99)
Glucose-Capillary: 108 mg/dL — ABNORMAL HIGH (ref 70–99)

## 2022-10-08 LAB — RENAL FUNCTION PANEL
Albumin: 3.1 g/dL — ABNORMAL LOW (ref 3.5–5.0)
Anion gap: 13 (ref 5–15)
BUN: 45 mg/dL — ABNORMAL HIGH (ref 8–23)
CO2: 28 mmol/L (ref 22–32)
Calcium: 8.8 mg/dL — ABNORMAL LOW (ref 8.9–10.3)
Chloride: 95 mmol/L — ABNORMAL LOW (ref 98–111)
Creatinine, Ser: 2.37 mg/dL — ABNORMAL HIGH (ref 0.44–1.00)
GFR, Estimated: 19 mL/min — ABNORMAL LOW (ref >60)
Glucose, Bld: 102 mg/dL — ABNORMAL HIGH (ref 70–99)
Phosphorus: 4.2 mg/dL (ref 2.5–4.6)
Potassium: 3.7 mmol/L (ref 3.5–5.1)
Sodium: 136 mmol/L (ref 135–145)

## 2022-10-08 LAB — MAGNESIUM: Magnesium: 2 mg/dL (ref 1.7–2.4)

## 2022-10-08 LAB — APTT
aPTT: 61 s — ABNORMAL HIGH (ref 24–36)
aPTT: 82 s — ABNORMAL HIGH (ref 24–36)
aPTT: 87 s — ABNORMAL HIGH (ref 24–36)

## 2022-10-08 LAB — LEGIONELLA PNEUMOPHILA SEROGP 1 UR AG: L. pneumophila Serogp 1 Ur Ag: NEGATIVE

## 2022-10-08 LAB — CBC
HCT: 36.2 % (ref 36.0–46.0)
Hemoglobin: 12.3 g/dL (ref 12.0–15.0)
MCH: 31 pg (ref 26.0–34.0)
MCHC: 34 g/dL (ref 30.0–36.0)
MCV: 91.2 fL (ref 80.0–100.0)
Platelets: 239 10*3/uL (ref 150–400)
RBC: 3.97 MIL/uL (ref 3.87–5.11)
RDW: 14.8 % (ref 11.5–15.5)
WBC: 14 10*3/uL — ABNORMAL HIGH (ref 4.0–10.5)
nRBC: 0 % (ref 0.0–0.2)

## 2022-10-08 LAB — HEPARIN LEVEL (UNFRACTIONATED): Heparin Unfractionated: >1.1 [IU]/mL — ABNORMAL HIGH (ref 0.30–0.70)

## 2022-10-08 LAB — T3, FREE: T3, Free: 0.9 pg/mL — ABNORMAL LOW (ref 2.0–4.4)

## 2022-10-08 MED ORDER — PREDNISONE 20 MG PO TABS
20.0000 mg | ORAL_TABLET | Freq: Every day | ORAL | Status: DC
  Administered 2022-10-09: 20 mg via ORAL
  Filled 2022-10-08: qty 1

## 2022-10-08 MED ORDER — ORAL CARE MOUTH RINSE: 15.0000 mL | OROMUCOSAL | Status: DC | PRN

## 2022-10-08 MED ORDER — SODIUM CHLORIDE 0.9 % IV SOLN
2.0000 g | INTRAVENOUS | Status: DC
  Administered 2022-10-08 – 2022-10-09 (×2): 2 g via INTRAVENOUS
  Filled 2022-10-08 (×4): qty 20

## 2022-10-08 MED ORDER — HYDROCORTISONE SOD SUC (PF) 100 MG IJ SOLR
50.0000 mg | Freq: Once | INTRAMUSCULAR | Status: AC
  Administered 2022-10-08: 50 mg via INTRAVENOUS
  Filled 2022-10-08: qty 2

## 2022-10-08 MED ORDER — FUROSEMIDE 20 MG PO TABS
40.0000 mg | ORAL_TABLET | Freq: Every day | ORAL | Status: DC
  Administered 2022-10-08: 40 mg via ORAL
  Filled 2022-10-08: qty 2

## 2022-10-08 MED ORDER — ALBUTEROL SULFATE (2.5 MG/3ML) 0.083% IN NEBU
2.5000 mg | INHALATION_SOLUTION | Freq: Three times a day (TID) | RESPIRATORY_TRACT | Status: DC
  Administered 2022-10-08 – 2022-10-09 (×3): 2.5 mg via RESPIRATORY_TRACT
  Filled 2022-10-08 (×3): qty 3

## 2022-10-08 NOTE — Progress Notes (Signed)
NAME:  Nichole Hess, MRN:  [id number], DOB:  [birth year], LOS: 2 ADMISSION DATE:  10/05/2022, CONSULTATION DATE:  10/06/22 REFERRING MD: Loleta Rose CHIEF COMPLAINT: SOB   HPI  89 y.o female with significant PMH of HTN, Ao atherosclerosis, hypothyroidism, and CKD III, HFmrEF, Atrial Fibrillation on Eliquis, and HLD who presented to the ED with chief complaints of progressive shortness of breath.  On review of the chart, patient was hospitalized at [facility] on 8/20 to 8/22 for new onset atrial fibrillation with RVR and acute HFmrEF in the setting of A-fib with RVR.  She was started on beta-blocker, Eliquis and discharged on Lasix.  Since discharge she followed with her cardiologist on 09/12/2022 who continued her metoprolol and Lasix but held Coreg, losartan and amlodipine due to hypotension.  Patient returned to the ED on 9/17 with shortness of breath and weight gain.  She was again diuresed and discharged home to follow-up with her cardiologist.  Patient saw her cardiologist again on 09/26/2022 who recommended starting amiodarone for rate control and possible cardioversion if she remained in A-fib.  She was also continued Eliquis 5 mg twice daily and her Lasix increased to 240 mg daily an additional 40 in the afternoon for shortness of breath or abdominal swelling.  Patient's developed worsening shortness of breath yesterday afternoon with associated orthopnea and increased work of breathing which prompted her to come to the ED. Denies fevers, chills, n/v or chest pain.   10/07/22- patient failed weaning extubation trial with apnea.  We will perform CPAP trials today for chest physiotherapy.  Patient remains on Dobutamine drip.  10/08/22- patient s/p liberation from mechanical ventilation.  Off BIPAP, for SLP today.  Weaning off dobutamine , will remove PICC.  Narrowing abx to PO post swallow eval.  PT/OT.    Past Medical History  HTN, Ao atherosclerosis, hypothyroidism, and CKD III, HFmrEF, Atrial  Fibrillation on Eliquis, and HLD   Significant Hospital Events   9/30: Admit to ICU with Acute on Chronic Respiratory Failure secondary to CHF exacerbation, failed BiPAP requiring intubation.  Consults:  Cardiology  Procedures:  9/30: Intubation  Significant Diagnostic Tests:  9/29: Chest Xray> IMPRESSION: COPD. Small bilateral effusions with bibasilar atelectasis. Heart mildly enlarged with interstitial prominence which may reflect interstitial edema.  Interim History / Subjective:    -  Micro Data:  9/29: SARS-CoV-2 PCR> negative 9/29: Influenza PCR> negative 9/30: Blood culture x2> 9/30: MRSA PCR>>  9/30: Strep pneumo urinary antigen> 9/30: Legionella urinary antigen>  Antimicrobials:  None  OBJECTIVE  Blood pressure (!) 142/56, pulse (!) 59, temperature 97.8 F (36.6 C), temperature source Axillary, resp. rate (!) 25, height 5\' 3"  (1.6 m), weight 69.1 kg, SpO2 92%. CVP:  [3 mmHg-17 mmHg] 8 mmHg  Vent Mode: PSV FiO2 (%):  [28 %-30 %] 30 % PEEP:  [5 cmH20] 5 cmH20 Pressure Support:  [5 cmH20] 5 cmH20   Intake/Output Summary (Last 24 hours) at 10/08/2022 0809 Last data filed at 10/08/2022 0600 Gross per 24 hour  Intake 307.17 ml  Output 1730 ml  Net -1422.83 ml   Filed Weights   10/06/22 0500 10/07/22 0354 10/08/22 0500  Weight: 73.4 kg 70.8 kg 69.1 kg    Physical Examination  GENERAL: 89 year-old critically ill patient lying in the bed intubated and Sedated EYES: PEERLA. No scleral icterus. Extraocular muscles intact.  HEENT: Head atraumatic, normocephalic. Oropharynx and nasopharynx clear.  NECK:  No JVD, supple  LUNGS: Decreased breath sounds with moderate wheezing bilaterally.  Mild use of accessory muscles of respiration.  CARDIOVASCULAR: S1, S2 normal. No murmurs, rubs, or gallops.  ABDOMEN: Soft, NTND EXTREMITIES: trace bilateral lower extremity edema. Capillary refill < 3 seconds in all extremities. Pulses palpable distally. NEUROLOGIC: The  patient is intubated and sedated. No focal neurological deficit appreciated. Cranial nerves are intact.  SKIN: No obvious rash, lesion, or ulcer. Warm to touch Labs/imaging that I havepersonally reviewed  (right click and "Reselect all SmartList Selections" daily)     Labs   CBC: Recent Labs  Lab 10/05/22 2257 10/06/22 0516 10/07/22 0408 10/08/22 0414  WBC 8.1 12.6* 12.4* 14.0*  NEUTROABS 5.3  --   --   --   HGB 14.4 14.2 13.0 12.3  HCT 45.0 45.0 37.7 36.2  MCV 95.1 95.5 87.5 91.2  PLT 263 258 231 239   Basic Metabolic Panel: Recent Labs  Lab 10/05/22 2257 10/06/22 0516 10/07/22 0408 10/08/22 0414  NA 132* 133* 135 136  K 4.8 5.1 4.2 3.7  CL 95* 98 98 95*  CO2 26 23 26 28   GLUCOSE 136* 135* 148* 102*  BUN 30* 32* 42* 45*  CREATININE 2.28* 2.38* 2.80* 2.37*  CALCIUM 9.5 9.2 8.8* 8.8*  MG  --   --  2.1 2.0  PHOS  --   --  4.5 4.2   GFR: Estimated Creatinine Clearance: 15.6 mL/min (A) (by C-G formula based on SCr of 2.37 mg/dL (H)). Recent Labs  Lab 10/05/22 2257 10/06/22 0106 10/06/22 0516 10/07/22 0408 10/07/22 0922 10/07/22 1244 10/08/22 0414  PROCALCITON  --  <0.10  --   --   --   --   --   WBC 8.1  --  12.6* 12.4*  --   --  14.0*  LATICACIDVEN 1.7 2.3*  --   --  1.5 1.2  --     Liver Function Tests: Recent Labs  Lab 10/05/22 2257 10/07/22 0408 10/08/22 0414  AST 26 96*  --   ALT 30 112*  --   ALKPHOS 59 49  --   BILITOT 1.3* 1.1  --   PROT 7.0 6.0*  --   ALBUMIN 3.8 3.0*  3.1* 3.1*   No results for input(s): "LIPASE", "AMYLASE" in the last 168 hours. No results for input(s): "AMMONIA" in the last 168 hours.  ABG    Component Value Date/Time   PHART 7.37 10/06/2022 0501   PCO2ART 42 10/06/2022 0501   PO2ART 51 (L) 10/06/2022 0501   HCO3 24.3 10/06/2022 0501   ACIDBASEDEF 1.1 10/06/2022 0501   O2SAT 73.1 10/07/2022 1245     Coagulation Profile: Recent Labs  Lab 10/05/22 2257 10/06/22 1111 10/07/22 0408  INR 2.3* 2.7* 2.4*    Cardiac Enzymes: No results for input(s): "CKTOTAL", "CKMB", "CKMBINDEX", "TROPONINI" in the last 168 hours.  HbA1C: Hgb A1c MFr Bld  Date/Time Value Ref Range Status  08/27/2022 05:25 AM 5.6 4.8 - 5.6 % Final    Comment:    (NOTE) Pre diabetes:          5.7%-6.4%  Diabetes:              >6.4%  Glycemic control for   <7.0% adults with diabetes     CBG: Recent Labs  Lab 10/07/22 1702 10/07/22 2030 10/07/22 2313 10/08/22 0347 10/08/22 0732  GLUCAP 121* 108* 108* 101* 102*    Review of Systems:   Unable to be obtained secondary to the patient's intubated and sedated status.   Past Medical History  She,  has a past medical history of Breast cancer (HCC) (06/05/1999), Hypertension, Personal history of radiation therapy, and Thyroid disease.   Surgical History    Past Surgical History:  Procedure Laterality Date   BREAST BIOPSY Left 06/05/99   lumpectomy/rad   BREAST LUMPECTOMY Left 2001   CHOLECYSTECTOMY       Social History   reports that she quit smoking about 54 years ago. Her smoking use included cigarettes. She started smoking about 64 years ago. She has a 10 pack-year smoking history. She has never used smokeless tobacco. She reports that she does not drink alcohol and does not use drugs.   Family History   Her family history includes Heart attack (age of onset: 29) in her father; Hypertension in her mother; Other in her mother. There is no history of Breast cancer.   Allergies No Known Allergies   Home Medications  Prior to Admission medications   Medication Sig Start Date End Date Taking? Authorizing Provider  amiodarone (PACERONE) 400 MG tablet Take 1 tablet (400 mg total) by mouth 2 (two) times daily. Take for 7 days. 09/26/22  Yes Gollan, Tollie Pizza, MD  apixaban (ELIQUIS) 5 MG TABS tablet Take 1 tablet (5 mg total) by mouth 2 (two) times daily. 08/29/22  Yes Arnetha Courser, MD  fluticasone (FLONASE) 50 MCG/ACT nasal spray Place 2 sprays into both  nostrils daily.   Yes [provider]  furosemide (LASIX) 20 MG tablet Take 2 tablets (40 mg total) by mouth daily. May take an additional 40 mg ( 1 tablet) daily as needed for shortness of breath or abdominal swelling. 09/26/22  Yes Gollan, Tollie Pizza, MD  gabapentin (NEURONTIN) 300 MG capsule Take 300 mg by mouth at bedtime.   Yes [provider]  levothyroxine (SYNTHROID) 75 MCG tablet Take 75 mcg by mouth daily before breakfast.   Yes [provider]  metoprolol tartrate (LOPRESSOR) 25 MG tablet Take 1 tablet (25 mg total) by mouth 2 (two) times daily. 08/29/22  Yes Arnetha Courser, MD  Multiple Vitamin (MULTI-VITAMINS) TABS Take 1 tablet by mouth daily.   Yes [provider]  potassium chloride (KLOR-CON) 10 MEQ tablet Take 1 tablet (10 mEq total) by mouth daily. May take an additional dose 10 Meq ( 1 tablet) as needed with extra lasix. 09/26/22 12/25/22 Yes Gollan, Tollie Pizza, MD  pravastatin (PRAVACHOL) 40 MG tablet Take 40 mg by mouth at bedtime.   Yes [provider]  amiodarone (PACERONE) 200 MG tablet Take 1 tablet (200 mg total) by mouth 2 (two) times daily. Start taking after completing 7 days of 400 mg twice daily. 10/05/22   Antonieta Iba, MD  Scheduled Meds:  albuterol  2.5 mg Nebulization Q6H   Chlorhexidine Gluconate Cloth  6 each Topical Daily   hydrocortisone sod succinate (SOLU-CORTEF) inj  50 mg Intravenous Q8H   mouth rinse  15 mL Mouth Rinse 4 times per day   sodium chloride flush  10-40 mL Intracatheter Q12H   Continuous Infusions:  sodium chloride Stopped (10/07/22 1056)   cefTRIAXone (ROCEPHIN)  IV Stopped (10/07/22 1037)   DOBUTamine 2.5 mcg/kg/min (10/08/22 0600)   heparin 700 Units/hr (10/08/22 0600)   norepinephrine (LEVOPHED) Adult infusion Stopped (10/07/22 0401)   PRN Meds:.albuterol, atropine, docusate sodium, mouth rinse, polyethylene glycol, sodium chloride flush  Active Hospital Problem list   See systems  below  Assessment & Plan:  Symptomatic Bradycardia #Low output Cardiogenic shock #Acute on Chronic Decompensated HF~BNP significantly elevated #  Atrial Fibrillation PMHx: HTN, HLD -EF 45-50% by echo on 08/27/22 -Unclear precipitating factor however TSH is significantly elevated maybe worsening symptoms -Cautious IV fluids -Vasopressors for MAP goal>65 -Trend lactic acid until normalized -Trend HS Troponin until peaked  -BNP is 1298 -Check Coox and CVP once central line placed -Echocardiogram -Hold Amiodarone and Metoprolol for now -Diuresis as BP and renal function permits ~ holding due to shock and AKI -Cardiology consulted -Hold Eliquis while NPO and start Heparin   #Acute Hypoxic Respiratory Failure in setting of Acute Decompensated HF, bilateral pleural effusions -Full vent support, implement lung protective strategies -Plateau pressures less than 30 cm H20 -Wean FiO2 & PEEP as tolerated to maintain O2 sats >92% -Follow intermittent Chest X-ray & ABG as needed -Spontaneous Breathing Trials when respiratory parameters met and mental status permits -Implement VAP Bundle -Prn Bronchodilators   #Acute Kidney Injury on CKD stage III #Lactic Acidosis -Monitor I&O's / urinary output -Follow BMP -Ensure adequate renal perfusion -Avoid nephrotoxic agents as able -Replace electrolytes as indicated    URINARY TRACT INFECTION     -on rocephin     #Sedation needs in setting of mechanical ventilation -Maintain a RASS goal of 0 to -1 -Propofol if needed to maintain RASS goal ~ currently on NO sedation -Avoid sedating medications as able -Daily wake up assessment  #Severe Hypothyroidism Hx of Hypothyroidism on Synthroid likely primary in the setting of poorly controlled hypothyroid patient. Patient's daughter reports  she may have been missing her doses of Synthroid  Na 125, Glu144, T4,<0.25, TSH 200.017:AMS (lethargy), hypothermia, hypotension, s/s of HF -hydrocortisone 100mg   q8h  -check cortisol level -Levothyroxine (T4) Loading dose 75 mcg IV followed by 50-100 mcg IV QD until able to take T4 orally.  -Avoid aggressive IVFs   Best practice:  Diet:  NPO Pain/Anxiety/Delirium protocol (if indicated): Yes (RASS goal -1) VAP protocol (if indicated): Yes DVT prophylaxis: Systemic AC GI prophylaxis: N/A Glucose control:  SSI Yes Central venous access:  N/A Arterial line:  N/A Foley:  Yes, and it is still needed Mobility:  bed rest  PT consulted: N/A Last date of multidisciplinary goals of care discussion [9/30] Code Status:  full code Disposition: ICU   = Goals of Care = Code Status Order: FULL  Primary Emergency Contact: JEFFRIES,SHERRY Patient's dauighter who is her POA Wishes to pursue full aggressive treatment and intervention options, including CPR and intubation  Critical care provider statement:   Total critical care time: 33 minutes   Performed by: Karna Christmas MD   Critical care time was exclusive of separately billable procedures and treating other patients.   Critical care was necessary to treat or prevent imminent or life-threatening deterioration.   Critical care was time spent personally by me on the following activities: development of treatment plan with patient and/or surrogate as well as nursing, discussions with consultants, evaluation of patient's response to treatment, examination of patient, obtaining history from patient or surrogate, ordering and performing treatments and interventions, ordering and review of laboratory studies, ordering and review of radiographic studies, pulse oximetry and re-evaluation of patient's condition.    Vida Rigger, M.D.  Pulmonary & Critical Care Medicine

## 2022-10-08 NOTE — Progress Notes (Signed)
Patient ID: Nichole Hess, female   DOB: 1933-05-29, 89 y.o.   MRN: [id number]     Advanced Heart Failure Rounding Note  PCP-Cardiologist: Julien Nordmann, MD   Subjective:    Extubated yesterday.  She remains on dobutamine 2.5 mcg/kg/min with stable BP and CVP 6-7 on my read.  She diuresed yesterday with IV Lasix. Creatinine 2.8 => 2.37.  Lactate normalized yesterday at 1.2 with co-ox 73%.    Objective:   Weight Range: 69.1 kg Body mass index is 26.99 kg/m.   Vital Signs:   Temp:  [97.3 F (36.3 C)-97.8 F (36.6 C)] 97.8 F (36.6 C) (10/02 0400) Pulse Rate:  [42-59] 59 (10/02 0800) Resp:  [8-25] 25 (10/02 0800) BP: (90-143)/(39-103) 142/56 (10/02 0800) SpO2:  [92 %-100 %] 92 % (10/02 0800) FiO2 (%):  [28 %-30 %] 30 % (10/02 0240) Weight:  [69.1 kg] 69.1 kg (10/02 0500) Last BM Date :  (PTA)  Weight change: Filed Weights   10/06/22 0500 10/07/22 0354 10/08/22 0500  Weight: 73.4 kg 70.8 kg 69.1 kg    Intake/Output:   Intake/Output Summary (Last 24 hours) at 10/08/2022 0828 Last data filed at 10/08/2022 0600 Gross per 24 hour  Intake 307.17 ml  Output 1730 ml  Net -1422.83 ml      Physical Exam    General:  Well appearing. No resp difficulty HEENT: Normal Neck: Supple. JVP 7 cm. Carotids 2+ bilat; no bruits. No lymphadenopathy or thyromegaly appreciated. Cor: PMI nondisplaced. Regular rate & rhythm. No rubs, gallops or murmurs. Lungs: Occasional rhonchi Abdomen: Soft, nontender, nondistended. No hepatosplenomegaly. No bruits or masses. Good bowel sounds. Extremities: No cyanosis, clubbing, rash. Trace edema.  Neuro: Alert & orientedx3, cranial nerves grossly intact. moves all 4 extremities w/o difficulty. Affect pleasant   Telemetry   NSR 60s (personally reviewed)   Labs    CBC Recent Labs    10/05/22 2257 10/06/22 0516 10/07/22 0408 10/08/22 0414  WBC 8.1   < > 12.4* 14.0*  NEUTROABS 5.3  --   --   --   HGB 14.4   < > 13.0 12.3  HCT 45.0   < >  37.7 36.2  MCV 95.1   < > 87.5 91.2  PLT 263   < > 231 239   < > = values in this interval not displayed.   Basic Metabolic Panel Recent Labs    40/98/11 0408 10/08/22 0414  NA 135 136  K 4.2 3.7  CL 98 95*  CO2 26 28  GLUCOSE 148* 102*  BUN 42* 45*  CREATININE 2.80* 2.37*  CALCIUM 8.8* 8.8*  MG 2.1 2.0  PHOS 4.5 4.2   Liver Function Tests Recent Labs    10/05/22 2257 10/07/22 0408 10/08/22 0414  AST 26 96*  --   ALT 30 112*  --   ALKPHOS 59 49  --   BILITOT 1.3* 1.1  --   PROT 7.0 6.0*  --   ALBUMIN 3.8 3.0*  3.1* 3.1*   No results for input(s): "LIPASE", "AMYLASE" in the last 72 hours. Cardiac Enzymes No results for input(s): "CKTOTAL", "CKMB", "CKMBINDEX", "TROPONINI" in the last 72 hours.  BNP: BNP (last 3 results) Recent Labs    08/26/22 1817 09/23/22 1339 10/05/22 2257  BNP 618.6* 1,066.5* 1,298.0*    ProBNP (last 3 results) No results for input(s): "PROBNP" in the last 8760 hours.   D-Dimer Recent Labs    10/06/22 0106  DDIMER 1.71*   Hemoglobin A1C No  results for input(s): "HGBA1C" in the last 72 hours. Fasting Lipid Panel Recent Labs    10/07/22 0828  CHOL 115  HDL 42  LDLCALC 63  TRIG 51  CHOLHDL 2.7   Thyroid Function Tests Recent Labs    10/06/22 0106 10/07/22 0922  TSH 11.330*  --   T3FREE  --  0.9*    Other results:   Imaging    [place] EKG SITE RITE  Result Date: 10/07/2022 If Site Rite image not attached, placement could not be confirmed due to current cardiac rhythm.    Medications:     Scheduled Medications:  albuterol  2.5 mg Nebulization Q6H   Chlorhexidine Gluconate Cloth  6 each Topical Daily   furosemide  40 mg Oral Daily   hydrocortisone sod succinate (SOLU-CORTEF) inj  50 mg Intravenous Q8H   mouth rinse  15 mL Mouth Rinse 4 times per day   sodium chloride flush  10-40 mL Intracatheter Q12H    Infusions:  sodium chloride Stopped (10/07/22 1056)   cefTRIAXone (ROCEPHIN)  IV     DOBUTamine 2.5  mcg/kg/min (10/08/22 0600)   heparin 700 Units/hr (10/08/22 0600)   norepinephrine (LEVOPHED) Adult infusion Stopped (10/07/22 0401)    PRN Medications: albuterol, atropine, docusate sodium, mouth rinse, polyethylene glycol, sodium chloride flush   Assessment/Plan   1. Atrial fibrillation: She was in sinus bradycardia at admission, HR in 40s initially, now in 60s.  She is on dobutamine 2.5.  - Continue heparin gtt for now, eventually back to Eliquis.  - No nodal blockade for now, may eventually be able to tolerate low dose amiodarone but will have to see.  - Will cut back dobutamine to 1 mcg/kg/min now, stop later today as long as HR stable.  2. Acute on chronic HFmREF: I reviewed echo done this admission, this showed EF 50%, mild LVH, normal RV, mild AI, mild-moderate MR, mild-moderate TR.  She was admitted with volume overload/pulmonary edema, BNP 1298 and AKI. This morning, creatinine 2.8 => 2.37.  She had lactate up to 2.3 in setting of hypotension/bradycardia/hypoxemia, decreased to 1.2 yesterday.  She is on dobutamine 2.5 in setting of bradycardia, NE has been weaned off. HR in 60s. CVP down to 6-7 today.   - Decrease dobutamine to 1, stop later today if she remains stable.   - Start Lasix 40 mg po daily.  - Shock resolved, wean down hydrocortisone.  3. AKI on CKD stage 3: Baseline creatinine looks to be around 1.3.  Up to 2.8 in setting of hypotension/bradycardia.  Suspect ATN.  Hopefully this will improve with time and maintenance of adequate BP => creatinine down to 2.37 today.   4. Abnormal thyroid panel: TSH mildly elevated at 11, free T4 also elevated with low free T3.  Suspect sick euthyroid.  Recheck as outpatient.  5. Acute hypoxemic respiratory failure: In setting of pulmonary edema.  With PCT <0.1, doubt PNA. Resolved.  - Remains on ceftriaxone per CCM.  6. Valvular heart disease: Echo this admission less impressive with mild-moderate MR and mild-moderate TR.   CRITICAL  CARE Performed by: Marca Ancona  Total critical care time: 35 minutes  Critical care time was exclusive of separately billable procedures and treating other patients.  Critical care was necessary to treat or prevent imminent or life-threatening deterioration.  Critical care was time spent personally by me on the following activities: development of treatment plan with patient and/or surrogate as well as nursing, discussions with consultants, evaluation of patient's response to  treatment, examination of patient, obtaining history from patient or surrogate, ordering and performing treatments and interventions, ordering and review of laboratory studies, ordering and review of radiographic studies, pulse oximetry and re-evaluation of patient's condition.  Length of Stay: 2  Marca Ancona, MD  10/08/2022, 8:28 AM  Advanced Heart Failure Team Pager [phone] (M-F; 7a - 5p)  Please contact [facility] for night-coverage after hours (5p -7a ) and weekends on [email or web address]

## 2022-10-08 NOTE — Consult Note (Signed)
ANTICOAGULATION CONSULT NOTE  Pharmacy Consult for Heparin Indication: atrial fibrillation  No Known Allergies  Patient Measurements: Height: 5\' 3"  (160 cm) Weight: 70.8 kg (156 lb 1.4 oz) IBW/kg (Calculated) : 52.4 Heparin Dosing Weight: 67.9 kg  Vital Signs: Temp: 97.6 F (36.4 C) (10/02 0000) Temp Source: Axillary (10/02 0000) BP: 117/50 (10/02 0400) Pulse Rate: 55 (10/02 0400)  Labs: Recent Labs    10/05/22 2257 10/06/22 0106 10/06/22 0516 10/06/22 1111 10/06/22 2049 10/07/22 0408 10/07/22 0828 10/07/22 1928 10/08/22 0414  HGB 14.4  --  14.2  --   --  13.0  --   --  12.3  HCT 45.0  --  45.0  --   --  37.7  --   --  36.2  PLT 263  --  258  --   --  231  --   --  239  APTT  --  38*  --   --    < >  --  126* 68* 61*  LABPROT 25.2*  --   --  28.8*  --  26.6*  --   --   --   INR 2.3*  --   --  2.7*  --  2.4*  --   --   --   HEPARINUNFRC  --   --   --   --   --   --  >1.10*  --  >1.10*  CREATININE 2.28*  --  2.38*  --   --  2.80*  --   --  2.37*  TROPONINIHS 32* 32*  --   --   --   --   --   --   --    < > = values in this interval not displayed.    Estimated Creatinine Clearance: 15.8 mL/min (A) (by C-G formula based on SCr of 2.37 mg/dL (H)).   Medical History: Past Medical History:  Diagnosis Date   Breast cancer (HCC) 06/05/1999   lumpectomy-positive   Hypertension    Personal history of radiation therapy    Thyroid disease     Pertinent Medications:  Apixaban 5mg  BID, last dose: 9/29@2000   Assessment: 89 y.o female with significant PMH of HTN, Ao atherosclerosis, hypothyroidism, and CKD III, HFmrEF, Atrial Fibrillation on Eliquis, and HLD who presented to the ED with chief complaints of progressive shortness of breath. Pharmacy has been consulted to initiate and monitor heparin infusion.  Baseline labs: aPTT 38sec, INR 2.7, Hgb 14.2, Plts 258  Goal of Therapy:  Heparin level 0.3-0.7 units/ml aPTT 66-102 seconds Monitor platelets by  anticoagulation protocol: Yes  09/30 2049 aPTT > 200 10/01 0828 aPTT 126, HL>1.10, supratherapeutic 10/01 1928 aPTT 68 sec, therapeutic x 1 10/02 0414 aPTT 61, subtherapeutic / HL > 1.1, not correlating  Plan: Given elevated INR and high risk for GIB, will avoid boluses Increase heparin infusion to 700 units/hr Recheck aPTT level in 8 hours after rate change Will use aPTT to guide dosing until HL correlates, then will transition to HL dosing Continue to monitor H&H and platelets  Otelia Sergeant, PharmD, [facility] 10/08/2022 4:52 AM

## 2022-10-08 NOTE — Consult Note (Signed)
ANTICOAGULATION CONSULT NOTE  Pharmacy Consult for Heparin Indication: atrial fibrillation  No Known Allergies  Patient Measurements: Height: 5\' 3"  (160 cm) Weight: 69.1 kg (152 lb 5.4 oz) IBW/kg (Calculated) : 52.4 Heparin Dosing Weight: 67.9 kg  Vital Signs: Temp: 97.8 F (36.6 C) (10/02 0400) Temp Source: Axillary (10/02 0400) BP: 127/62 (10/02 1400) Pulse Rate: 52 (10/02 1400)  Labs: Recent Labs    10/05/22 2257 10/06/22 0106 10/06/22 0516 10/06/22 1111 10/06/22 2049 10/07/22 0408 10/07/22 0828 10/07/22 1928 10/08/22 0414 10/08/22 1249  HGB 14.4  --  14.2  --   --  13.0  --   --  12.3  --   HCT 45.0  --  45.0  --   --  37.7  --   --  36.2  --   PLT 263  --  258  --   --  231  --   --  239  --   APTT  --  38*  --   --    < >  --  126* 68* 61* 82*  LABPROT 25.2*  --   --  28.8*  --  26.6*  --   --   --   --   INR 2.3*  --   --  2.7*  --  2.4*  --   --   --   --   HEPARINUNFRC  --   --   --   --   --   --  >1.10*  --  >1.10*  --   CREATININE 2.28*  --  2.38*  --   --  2.80*  --   --  2.37*  --   TROPONINIHS 32* 32*  --   --   --   --   --   --   --   --    < > = values in this interval not displayed.    Estimated Creatinine Clearance: 15.6 mL/min (A) (by C-G formula based on SCr of 2.37 mg/dL (H)).   Medical History: Past Medical History:  Diagnosis Date   Breast cancer (HCC) 06/05/1999   lumpectomy-positive   Hypertension    Personal history of radiation therapy    Thyroid disease     Pertinent Medications:  Apixaban 5mg  BID, last dose: 9/29@2000   Assessment: 89 y.o female with significant PMH of HTN, Ao atherosclerosis, hypothyroidism, and CKD III, HFmrEF, Atrial Fibrillation on Eliquis, and HLD who presented to the ED with chief complaints of progressive shortness of breath. Pharmacy has been consulted to initiate and monitor heparin infusion.  Baseline labs: aPTT 38sec, INR 2.7, Hgb 14.2, Plts 258  Goal of Therapy:  Heparin level 0.3-0.7  units/ml aPTT 66-102 seconds Monitor platelets by anticoagulation protocol: Yes  09/30 2049 aPTT > 200 10/01 0828 aPTT 126, HL>1.10, supratherapeutic 10/01 1928 aPTT 68 sec, therapeutic x 1 10/02 0414 aPTT 61, subtherapeutic / HL > 1.1, not correlating 10/02 1249 aPTT 82, therapeutic x 1  Plan: Given elevated INR and high risk for GIB, will avoid boluses Continue heparin infusion at 700 units/hr Check confirmatory aPTT level in 8 hours Will use aPTT to guide dosing until HL correlates, then will transition to HL dosing Continue to monitor H&H and platelets  Bettey Costa, PharmD Clinical Pharmacist 10/08/2022 3:07 PM

## 2022-10-08 NOTE — Progress Notes (Signed)
Daily Progress Note   Patient Name: Nichole Hess       Date: 10/08/2022 DOB: 1933/08/10  Age: 89 y.o. MRN#: [id number] Attending Physician: Vida Rigger, MD Primary Care Physician: Luciana Axe, NP Admit Date: 10/05/2022  Reason for Consultation/Follow-up: Establishing goals of care  Subjective: Notes and labs reviewed.  In to see patient.  Currently she is sitting in bed speaking with staff.  Upon their departure she states she is feeling well.  She states she is divorced and has a longstanding significant other.  She states she has a daughter, and states all 3 of them live together in the same house.  She is extremely clear to tell me that her daughter would be her surrogate decision maker and not her longstanding significant other.  Broached goals of care and various decisions; she states she has never really given any of this any thought but will need to do so.  Visitors came to see patient.  Will revisit at a later time.  Length of Stay: 2  Current Medications: Scheduled Meds:   albuterol  2.5 mg Nebulization TID   Chlorhexidine Gluconate Cloth  6 each Topical Daily   furosemide  40 mg Oral Daily   hydrocortisone sod succinate (SOLU-CORTEF) inj  50 mg Intravenous Once   mouth rinse  15 mL Mouth Rinse 4 times per day   [START ON 10/09/2022] predniSONE  20 mg Oral Q breakfast   sodium chloride flush  10-40 mL Intracatheter Q12H    Continuous Infusions:  sodium chloride Stopped (10/07/22 1056)   cefTRIAXone (ROCEPHIN)  IV Stopped (10/08/22 0911)   heparin 700 Units/hr (10/08/22 1400)    PRN Meds: albuterol, atropine, docusate sodium, mouth rinse, mouth rinse, polyethylene glycol, sodium chloride flush  Physical Exam Pulmonary:     Effort: Pulmonary effort is normal.   Neurological:     Mental Status: She is alert.             Vital Signs: BP 127/62   Pulse (!) 52   Temp 97.8 F (36.6 C) (Axillary)   Resp (!) 21   Ht 5\' 3"  (1.6 m)   Wt 69.1 kg   SpO2 94%   BMI 26.99 kg/m  SpO2: SpO2: 94 % O2 Device: O2 Device: Nasal Cannula O2 Flow Rate: O2 Flow Rate (L/min):  2 L/min  Intake/output summary:  Intake/Output Summary (Last 24 hours) at 10/08/2022 1626 Last data filed at 10/08/2022 1400 Gross per 24 hour  Intake 283.06 ml  Output 1015 ml  Net -731.94 ml   LBM: Last BM Date :  (PTA) Baseline Weight: Weight: 73.4 kg Most recent weight: Weight: 69.1 kg   Patient Active Problem List   Diagnosis Date Noted   Malnutrition of moderate degree 10/07/2022   Goals of care, counseling/discussion 10/07/2022   Acute on chronic respiratory failure with hypoxemia (HCC) 10/06/2022   Acute pulmonary edema (HCC) 08/27/2022   Dilated cardiomyopathy (HCC) 08/27/2022   Nonrheumatic mitral valve regurgitation 08/27/2022   Nonrheumatic tricuspid valve regurgitation 08/27/2022   Acute HFrEF (heart failure with reduced ejection fraction) (HCC) 08/27/2022   CKD (chronic kidney disease) stage 3, GFR 30-59 ml/min (HCC) 08/26/2022   SOB (shortness of breath) 08/26/2022   Acute respiratory failure with hypoxia (HCC) 08/26/2022   Atrial fibrillation with RVR (HCC) 08/26/2022   Hypertension    Prediabetes 12/15/2021   Hypertension, essential, benign 06/30/2013   Hypothyroidism 06/30/2013    Palliative Care Assessment & Plan     Recommendations/Plan:  PMT will follow   Code Status:    Code Status Orders  (From admission, onward)           Start     Ordered   10/06/22 0107  Full code  Continuous       Question:  By:  Answer:  Consent: discussion documented in EHR   10/06/22 0112           Code Status History     Date Active Date Inactive Code Status Order ID Comments User Context   08/26/2022 2040 08/29/2022 1959 Full Code [id number]   Gertha Calkin, MD ED       Prognosis:  Unable to determine  Thank you for allowing the Palliative Medicine Team to assist in the care of this patient.    Morton Stall, NP  Please contact Palliative Medicine Team phone at [phone] for questions and concerns.

## 2022-10-08 NOTE — Consult Note (Signed)
PHARMACY CONSULT NOTE - ELECTROLYTES  Pharmacy Consult for Electrolyte Monitoring and Replacement   Recent Labs: Height: 5\' 3"  (160 cm) Weight: 69.1 kg (152 lb 5.4 oz) IBW/kg (Calculated) : 52.4 Estimated Creatinine Clearance: 15.6 mL/min (A) (by C-G formula based on SCr of 2.37 mg/dL (H)). Potassium (mmol/L)  Date Value  10/08/2022 3.7  06/06/2011 4.0   Magnesium (mg/dL)  Date Value  54/09/8117 2.0   Calcium (mg/dL)  Date Value  14/78/2956 8.8 (L)   Calcium, Total (mg/dL)  Date Value  21/30/8657 9.3   Albumin (g/dL)  Date Value  84/69/6295 3.1 (L)  06/06/2011 3.6   Phosphorus (mg/dL)  Date Value  28/41/3244 4.2   Sodium (mmol/L)  Date Value  10/08/2022 136  09/26/2022 134  06/06/2011 138    Assessment  Nichole Hess is a 89 y.o. female presenting with shortness of breath. PMH significant for HTN, Ao atherosclerosis, hypothyroidism, and CKD III, HFmrEF, Atrial Fibrillation on Eliquis, and HLD  . Pharmacy has been consulted to monitor and replace electrolytes.  Diet: NPO Pertinent medications: Furosemide 80mg  IV x 1  Goal of Therapy: Electrolytes WNL  Plan:  No replacement indicated Check BMP, Mg, Phos with AM labs  Thank you for allowing pharmacy to be a part of this patient's care.  Bettey Costa, PharmD Clinical Pharmacist 10/08/2022 8:04 AM

## 2022-10-08 NOTE — Consult Note (Signed)
ANTICOAGULATION CONSULT NOTE  Pharmacy Consult for Heparin Indication: atrial fibrillation  No Known Allergies  Patient Measurements: Height: 5\' 3"  (160 cm) Weight: 69.1 kg (152 lb 5.4 oz) IBW/kg (Calculated) : 52.4 Heparin Dosing Weight: 67.9 kg  Vital Signs: Temp: 97.6 F (36.4 C) (10/02 1930) Temp Source: Oral (10/02 1930) BP: 135/63 (10/02 2200) Pulse Rate: 56 (10/02 2200)  Labs: Recent Labs    10/05/22 2257 10/06/22 0106 10/06/22 0516 10/06/22 1111 10/06/22 2049 10/07/22 0408 10/07/22 0828 10/07/22 1928 10/08/22 0414 10/08/22 1249 10/08/22 2042  HGB 14.4  --  14.2  --   --  13.0  --   --  12.3  --   --   HCT 45.0  --  45.0  --   --  37.7  --   --  36.2  --   --   PLT 263  --  258  --   --  231  --   --  239  --   --   APTT  --  38*  --   --    < >  --  126*   < > 61* 82* 87*  LABPROT 25.2*  --   --  28.8*  --  26.6*  --   --   --   --   --   INR 2.3*  --   --  2.7*  --  2.4*  --   --   --   --   --   HEPARINUNFRC  --   --   --   --   --   --  >1.10*  --  >1.10*  --   --   CREATININE 2.28*  --  2.38*  --   --  2.80*  --   --  2.37*  --   --   TROPONINIHS 32* 32*  --   --   --   --   --   --   --   --   --    < > = values in this interval not displayed.    Estimated Creatinine Clearance: 15.6 mL/min (A) (by C-G formula based on SCr of 2.37 mg/dL (H)).   Medical History: Past Medical History:  Diagnosis Date   Breast cancer (HCC) 06/05/1999   lumpectomy-positive   Hypertension    Personal history of radiation therapy    Thyroid disease     Pertinent Medications:  Apixaban 5mg  BID, last dose: 9/29@2000   Assessment: 89 y.o female with significant PMH of HTN, Ao atherosclerosis, hypothyroidism, and CKD III, HFmrEF, Atrial Fibrillation on Eliquis, and HLD who presented to the ED with chief complaints of progressive shortness of breath. Pharmacy has been consulted to initiate and monitor heparin infusion.  Baseline labs: aPTT 38sec, INR 2.7, Hgb 14.2,  Plts 258  Goal of Therapy:  Heparin level 0.3-0.7 units/ml aPTT 66-102 seconds Monitor platelets by anticoagulation protocol: Yes  09/30 2049 aPTT > 200 10/01 0828 aPTT 126, HL>1.10, supratherapeutic 10/01 1928 aPTT 68 sec, therapeutic x 1 10/02 0414 aPTT 61, subtherapeutic / HL > 1.1, not correlating 10/02 1249 aPTT 82, therapeutic x 1 10/02 2042 aPTT 87, therapeutic x 2  Plan: Given elevated INR and high risk for GIB, will avoid boluses Continue heparin infusion at 700 units/hr Recheck aPTT level daily w/ AM labs while therapeutic Will use aPTT to guide dosing until HL correlates, then will transition to HL dosing Continue to monitor H&H and platelets  Otelia Sergeant, PharmD,  MBA 10/08/2022 10:05 PM

## 2022-10-09 ENCOUNTER — Ambulatory Visit: Admit: 2022-10-09 | Payer: Medicare HMO | Admitting: Cardiovascular Disease

## 2022-10-09 DIAGNOSIS — I5033 Acute on chronic diastolic (congestive) heart failure: Secondary | ICD-10-CM | POA: Diagnosis not present

## 2022-10-09 LAB — GLUCOSE, CAPILLARY
Glucose-Capillary: 108 mg/dL — ABNORMAL HIGH (ref 70–99)
Glucose-Capillary: 111 mg/dL — ABNORMAL HIGH (ref 70–99)
Glucose-Capillary: 119 mg/dL — ABNORMAL HIGH (ref 70–99)
Glucose-Capillary: 125 mg/dL — ABNORMAL HIGH (ref 70–99)
Glucose-Capillary: 91 mg/dL (ref 70–99)
Glucose-Capillary: 96 mg/dL (ref 70–99)

## 2022-10-09 LAB — HEPATIC FUNCTION PANEL
ALT: 107 U/L — ABNORMAL HIGH (ref 0–44)
AST: 60 U/L — ABNORMAL HIGH (ref 15–41)
Albumin: 2.9 g/dL — ABNORMAL LOW (ref 3.5–5.0)
Alkaline Phosphatase: 44 U/L (ref 38–126)
Bilirubin, Direct: 0.2 mg/dL (ref 0.0–0.2)
Indirect Bilirubin: 0.6 mg/dL (ref 0.3–0.9)
Total Bilirubin: 0.8 mg/dL (ref 0.3–1.2)
Total Protein: 6 g/dL — ABNORMAL LOW (ref 6.5–8.1)

## 2022-10-09 LAB — RENAL FUNCTION PANEL
Albumin: 2.9 g/dL — ABNORMAL LOW (ref 3.5–5.0)
Anion gap: 10 (ref 5–15)
BUN: 40 mg/dL — ABNORMAL HIGH (ref 8–23)
CO2: 28 mmol/L (ref 22–32)
Calcium: 8.6 mg/dL — ABNORMAL LOW (ref 8.9–10.3)
Chloride: 97 mmol/L — ABNORMAL LOW (ref 98–111)
Creatinine, Ser: 1.77 mg/dL — ABNORMAL HIGH (ref 0.44–1.00)
GFR, Estimated: 27 mL/min — ABNORMAL LOW (ref >60)
Glucose, Bld: 92 mg/dL (ref 70–99)
Phosphorus: 3.6 mg/dL (ref 2.5–4.6)
Potassium: 3.4 mmol/L — ABNORMAL LOW (ref 3.5–5.1)
Sodium: 135 mmol/L (ref 135–145)

## 2022-10-09 LAB — URINE CULTURE

## 2022-10-09 LAB — CBC
HCT: 34.1 % — ABNORMAL LOW (ref 36.0–46.0)
Hemoglobin: 11.6 g/dL — ABNORMAL LOW (ref 12.0–15.0)
MCH: 30.6 pg (ref 26.0–34.0)
MCHC: 34 g/dL (ref 30.0–36.0)
MCV: 90 fL (ref 80.0–100.0)
Platelets: 245 10*3/uL (ref 150–400)
RBC: 3.79 MIL/uL — ABNORMAL LOW (ref 3.87–5.11)
RDW: 15 % (ref 11.5–15.5)
WBC: 10.7 10*3/uL — ABNORMAL HIGH (ref 4.0–10.5)
nRBC: 0 % (ref 0.0–0.2)

## 2022-10-09 LAB — APTT: aPTT: 90 s — ABNORMAL HIGH (ref 24–36)

## 2022-10-09 LAB — HEPARIN LEVEL (UNFRACTIONATED): Heparin Unfractionated: >1.1 [IU]/mL — ABNORMAL HIGH (ref 0.30–0.70)

## 2022-10-09 LAB — MAGNESIUM: Magnesium: 2.1 mg/dL (ref 1.7–2.4)

## 2022-10-09 SURGERY — CARDIOVERSION
Anesthesia: General

## 2022-10-09 MED ORDER — FUROSEMIDE 40 MG PO TABS: 40.0000 mg | ORAL_TABLET | Freq: Every day | ORAL | Status: DC

## 2022-10-09 MED ORDER — ADULT MULTIVITAMIN W/MINERALS CH
1.0000 | ORAL_TABLET | Freq: Every day | ORAL | Status: DC
  Administered 2022-10-10 – 2022-10-11 (×2): 1 via ORAL
  Filled 2022-10-09 (×2): qty 1

## 2022-10-09 MED ORDER — APIXABAN 2.5 MG PO TABS
2.5000 mg | ORAL_TABLET | Freq: Two times a day (BID) | ORAL | Status: DC
  Administered 2022-10-09 – 2022-10-11 (×5): 2.5 mg via ORAL
  Filled 2022-10-09 (×5): qty 1

## 2022-10-09 MED ORDER — FUROSEMIDE 10 MG/ML IJ SOLN
80.0000 mg | Freq: Once | INTRAMUSCULAR | Status: AC
  Administered 2022-10-09: 80 mg via INTRAVENOUS
  Filled 2022-10-09: qty 8

## 2022-10-09 MED ORDER — POTASSIUM CHLORIDE CRYS ER 20 MEQ PO TBCR
40.0000 meq | EXTENDED_RELEASE_TABLET | ORAL | Status: AC
  Administered 2022-10-09 (×2): 40 meq via ORAL
  Filled 2022-10-09 (×2): qty 2

## 2022-10-09 MED ORDER — ENSURE ENLIVE PO LIQD
237.0000 mL | Freq: Three times a day (TID) | ORAL | Status: DC
  Administered 2022-10-09 – 2022-10-11 (×4): 237 mL via ORAL

## 2022-10-09 MED ORDER — ALBUTEROL SULFATE (2.5 MG/3ML) 0.083% IN NEBU
2.5000 mg | INHALATION_SOLUTION | Freq: Two times a day (BID) | RESPIRATORY_TRACT | Status: DC
  Administered 2022-10-09 – 2022-10-10 (×2): 2.5 mg via RESPIRATORY_TRACT
  Filled 2022-10-09: qty 3

## 2022-10-09 MED ORDER — FUROSEMIDE 10 MG/ML IJ SOLN
80.0000 mg | Freq: Once | INTRAMUSCULAR | Status: AC
  Administered 2022-10-10: 80 mg via INTRAVENOUS
  Filled 2022-10-09: qty 8

## 2022-10-09 NOTE — Consult Note (Signed)
PHARMACY CONSULT NOTE - ELECTROLYTES  Pharmacy Consult for Electrolyte Monitoring and Replacement   Recent Labs: Height: 5\' 3"  (160 cm) Weight: 68.8 kg (151 lb 10.8 oz) IBW/kg (Calculated) : 52.4 Estimated Creatinine Clearance: 20.9 mL/min (A) (by C-G formula based on SCr of 1.77 mg/dL (H)). Potassium (mmol/L)  Date Value  10/09/2022 3.4 (L)  06/06/2011 4.0   Magnesium (mg/dL)  Date Value  02/72/5366 2.1   Calcium (mg/dL)  Date Value  44/03/4740 8.6 (L)   Calcium, Total (mg/dL)  Date Value  59/56/3875 9.3   Albumin (g/dL)  Date Value  64/33/2951 2.9 (L)  10/09/2022 2.9 (L)  06/06/2011 3.6   Phosphorus (mg/dL)  Date Value  88/41/6606 3.6   Sodium (mmol/L)  Date Value  10/09/2022 135  09/26/2022 134  06/06/2011 138    Assessment  Nichole Hess is a 89 y.o. female presenting with shortness of breath. PMH significant for HTN, Ao atherosclerosis, hypothyroidism, and CKD III, HFmrEF, Atrial Fibrillation on Eliquis, and HLD  . Pharmacy has been consulted to monitor and replace electrolytes.  Diet: regular, extubated 10/2 Pertinent medications: Furosemide 80mg  IV x 2  Goal of Therapy: Electrolytes WNL  Plan:  K 3.4: Kcl x 2 Check BMP, Mg, Phos with AM labs  Thank you for allowing pharmacy to be a part of this patient's care.  Bettey Costa, PharmD Clinical Pharmacist 10/09/2022 8:14 AM

## 2022-10-09 NOTE — Evaluation (Signed)
Occupational Therapy Evaluation Patient Details Name: Nichole Hess MRN: [id number] DOB: [birth year] Today's Date: 10/09/2022   History of Present Illness 89 y.o female with significant PMH of HTN, Ao atherosclerosis, hypothyroidism, and CKD III, HFmrEF, Atrial Fibrillation on Eliquis, and HLD who presented to the ED with chief complaints of progressive shortness of breath. Admit to ICU with Acute on Chronic Respiratory Failure secondary to CHF exacerbation, failed BiPAP requiring intubation.   Clinical Impression   Ms Patras was seen for OT evaluation this date. Prior to hospital admission, pt was MOD I using SPC. Pt lives with daughter. Pt currently requires CGA for BSC t/f. MOD I pericare sitting. MIN A don underwear, asist for threading over LLE. Left in chair with PT in room. Pt would benefit from skilled OT to address noted impairments and functional limitations (see below for any additional details). Upon hospital discharge, recommend no OT follow up.    If plan is discharge home, recommend the following: A little help with walking and/or transfers;A little help with bathing/dressing/bathroom    Functional Status Assessment  Patient has had a recent decline in their functional status and demonstrates the ability to make significant improvements in function in a reasonable and predictable amount of time.  Equipment Recommendations  BSC/3in1    Recommendations for Other Services       Precautions / Restrictions Precautions Precautions: Fall Restrictions Weight Bearing Restrictions: No      Mobility Bed Mobility Overal bed mobility: Modified Independent                  Transfers Overall transfer level: Needs assistance Equipment used: None Transfers: Sit to/from Stand, Bed to chair/wheelchair/BSC Sit to Stand: Contact guard assist     Step pivot transfers: Contact guard assist            Balance Overall balance assessment: Needs assistance Sitting-balance  support: Feet supported, No upper extremity supported Sitting balance-Leahy Scale: Good     Standing balance support: No upper extremity supported, During functional activity Standing balance-Leahy Scale: Fair Standing balance comment: one anterior LOB with dressing, corrects with single UE support                           ADL either performed or assessed with clinical judgement   ADL Overall ADL's : Needs assistance/impaired                                       General ADL Comments: CGA for BSC t/f. MOD I pericare sitting. MIN A don underwear, asist for threading over LLE.      Pertinent Vitals/Pain Pain Assessment Pain Assessment: No/denies pain     Extremity/Trunk Assessment Upper Extremity Assessment Upper Extremity Assessment: [facility]   Lower Extremity Assessment Lower Extremity Assessment: Generalized weakness       Communication Communication Communication: No apparent difficulties   Cognition Arousal: Alert Behavior During Therapy: WFL for tasks assessed/performed Overall Cognitive Status: Within Functional Limits for tasks assessed                                                  Home Living Family/patient expects to be discharged to:: Private residence Living Arrangements: Children Available Help at Discharge:  Family Type of Home: House Home Access: Stairs to enter [employer] of Steps: 3 Entrance Stairs-Rails: Left Home Layout: One level     Bathroom Shower/Tub: Tub/shower unit         Home Equipment: Cane - single point;Shower seat   Additional Comments: Can't remember if she has RW or not      Prior Functioning/Environment Prior Level of Function : Independent/Modified Independent             Mobility Comments: PRN use of SPC around the house, household ambulator ADLs Comments: Per prior chart documentation: daughter assists with driving/cooking/cleaning.        OT Problem  List: Decreased strength;Decreased range of motion;Decreased activity tolerance      OT Treatment/Interventions: Self-care/ADL training;Therapeutic exercise;Energy conservation;DME and/or AE instruction;Therapeutic activities;Patient/family education;Balance training    OT Goals(Current goals can be found in the care plan section) Acute Rehab OT Goals Patient Stated Goal: to go home OT Goal Formulation: With patient Time For Goal Achievement: 10/23/22 Potential to Achieve Goals: Good ADL Goals Pt Will Perform Grooming: Independently;standing Pt Will Perform Lower Body Dressing: sit to/from stand;Independently Pt Will Transfer to Toilet: with modified independence;ambulating;regular height toilet  OT Frequency: Min 1X/week    Co-evaluation              AM-PAC OT "6 Clicks" Daily Activity     Outcome Measure Help from another person eating meals?: None Help from another person taking care of personal grooming?: A Little Help from another person toileting, which includes using toliet, bedpan, or urinal?: A Little Help from another person bathing (including washing, rinsing, drying)?: A Little Help from another person to put on and taking off regular upper body clothing?: None Help from another person to put on and taking off regular lower body clothing?: A Little 6 Click Score: 20   End of Session Nurse Communication: Mobility status  Activity Tolerance: Patient tolerated treatment well Patient left: in chair;with call bell/phone within reach  OT Visit Diagnosis: Unsteadiness on feet (R26.81);Other abnormalities of gait and mobility (R26.89)                Time: 0865-7846 OT Time Calculation (min): 36 min Charges:  OT General Charges $OT Visit: 1 Visit OT Evaluation $OT Eval Moderate Complexity: 1 Mod OT Treatments $Self Care/Home Management : 23-37 mins  Kathie Dike, M.S. OTR/L  10/09/22, 12:53 PM  ascom [phone]

## 2022-10-09 NOTE — Consult Note (Signed)
ANTICOAGULATION CONSULT NOTE  Pharmacy Consult for Heparin Indication: atrial fibrillation  No Known Allergies  Patient Measurements: Height: 5\' 3"  (160 cm) Weight: 68.8 kg (151 lb 10.8 oz) IBW/kg (Calculated) : 52.4 Heparin Dosing Weight: 67.9 kg  Vital Signs: Temp: 97.6 F (36.4 C) (10/02 1930) Temp Source: Oral (10/02 1930) BP: 138/64 (10/03 0500) Pulse Rate: 52 (10/03 0500)  Labs: Recent Labs    10/06/22 1111 10/06/22 2049 10/07/22 0408 10/07/22 0828 10/07/22 1928 10/08/22 0414 10/08/22 1249 10/08/22 2042 10/09/22 0613  HGB  --    < > 13.0  --   --  12.3  --   --  11.6*  HCT  --   --  37.7  --   --  36.2  --   --  34.1*  PLT  --   --  231  --   --  239  --   --  245  APTT  --    < >  --  126*   < > 61* 82* 87* 90*  LABPROT 28.8*  --  26.6*  --   --   --   --   --   --   INR 2.7*  --  2.4*  --   --   --   --   --   --   HEPARINUNFRC  --   --   --  >1.10*  --  >1.10*  --   --  >1.10*  CREATININE  --   --  2.80*  --   --  2.37*  --   --  1.77*   < > = values in this interval not displayed.    Estimated Creatinine Clearance: 20.9 mL/min (A) (by C-G formula based on SCr of 1.77 mg/dL (H)).   Medical History: Past Medical History:  Diagnosis Date   Breast cancer (HCC) 06/05/1999   lumpectomy-positive   Hypertension    Personal history of radiation therapy    Thyroid disease     Pertinent Medications:  Apixaban 5mg  BID, last dose: 9/29@2000   Assessment: 89 y.o female with significant PMH of HTN, Ao atherosclerosis, hypothyroidism, and CKD III, HFmrEF, Atrial Fibrillation on Eliquis, and HLD who presented to the ED with chief complaints of progressive shortness of breath. Pharmacy has been consulted to initiate and monitor heparin infusion.  Baseline labs: aPTT 38sec, INR 2.7, Hgb 14.2, Plts 258  Goal of Therapy:  Heparin level 0.3-0.7 units/ml aPTT 66-102 seconds Monitor platelets by anticoagulation protocol: Yes  09/30 2049 aPTT > 200 10/01 0828  aPTT 126, HL>1.10, supratherapeutic 10/01 1928 aPTT 68 sec, therapeutic x 1 10/02 0414 aPTT 61, subtherapeutic / HL > 1.1, not correlating 10/02 1249 aPTT 82, therapeutic x 1 10/02 2042 aPTT 87, therapeutic x 2 10/03 0613 aPTT 90, therapeutic x 3 / HL > 1.1, not correlating  Plan: Given elevated INR and high risk for GIB, will avoid boluses Continue heparin infusion at 700 units/hr Recheck aPTT level daily w/ AM labs while therapeutic Will use aPTT to guide dosing until HL correlates, then will transition to HL dosing Continue to monitor H&H and platelets  Otelia Sergeant, PharmD, MBA 10/09/2022 7:00 AM

## 2022-10-09 NOTE — Progress Notes (Signed)
NAME:  Nichole Hess, MRN:  [id number], DOB:  06/28/1933, LOS: 3 ADMISSION DATE:  10/05/2022, CONSULTATION DATE:  10/06/22 REFERRING MD: Loleta Rose CHIEF COMPLAINT: SOB   HPI  89 y.o female with significant PMH of HTN, Ao atherosclerosis, hypothyroidism, and CKD III, HFmrEF, Atrial Fibrillation on Eliquis, and HLD who presented to the ED with chief complaints of progressive shortness of breath.  On review of the chart, patient was hospitalized at [facility] on 8/20 to 8/22 for new onset atrial fibrillation with RVR and acute HFmrEF in the setting of A-fib with RVR.  She was started on beta-blocker, Eliquis and discharged on Lasix.  Since discharge she followed with her cardiologist on 09/12/2022 who continued her metoprolol and Lasix but held Coreg, losartan and amlodipine due to hypotension.  Patient returned to the ED on 9/17 with shortness of breath and weight gain.  She was again diuresed and discharged home to follow-up with her cardiologist.  Patient saw her cardiologist again on 09/26/2022 who recommended starting amiodarone for rate control and possible cardioversion if she remained in A-fib.  She was also continued Eliquis 5 mg twice daily and her Lasix increased to 240 mg daily an additional 40 in the afternoon for shortness of breath or abdominal swelling.  Patient's developed worsening shortness of breath yesterday afternoon with associated orthopnea and increased work of breathing which prompted her to come to the ED. Denies fevers, chills, n/v or chest pain.   10/07/22- patient failed weaning extubation trial with apnea.  We will perform CPAP trials today for chest physiotherapy.  Patient remains on Dobutamine drip.  10/08/22- patient s/p liberation from mechanical ventilation.  Off BIPAP, for SLP today.  Weaning off dobutamine , will remove PICC.  Narrowing abx to PO post swallow eval.  PT/OT.   10/09/22-  patient stable overnight, Optimizing for TRH transfer  Past Medical History  HTN, Ao  atherosclerosis, hypothyroidism, and CKD III, HFmrEF, Atrial Fibrillation on Eliquis, and HLD   Significant Hospital Events   9/30: Admit to ICU with Acute on Chronic Respiratory Failure secondary to CHF exacerbation, failed BiPAP requiring intubation.  Consults:  Cardiology  Procedures:  9/30: Intubation  Significant Diagnostic Tests:  9/29: Chest Xray> IMPRESSION: COPD. Small bilateral effusions with bibasilar atelectasis. Heart mildly enlarged with interstitial prominence which may reflect interstitial edema.  Interim History / Subjective:    -  Micro Data:  9/29: SARS-CoV-2 PCR> negative 9/29: Influenza PCR> negative 9/30: Blood culture x2> 9/30: MRSA PCR>>  9/30: Strep pneumo urinary antigen> 9/30: Legionella urinary antigen>  Antimicrobials:  None  OBJECTIVE  Blood pressure (!) 156/67, pulse (!) 57, temperature 97.6 F (36.4 C), temperature source Oral, resp. rate 16, height 5\' 3"  (1.6 m), weight 68.8 kg, SpO2 97%. CVP:  [11 mmHg-42 mmHg] 11 mmHg      Intake/Output Summary (Last 24 hours) at 10/09/2022 1024 Last data filed at 10/09/2022 0817 Gross per 24 hour  Intake 659.14 ml  Output 1030 ml  Net -370.86 ml   Filed Weights   10/07/22 0354 10/08/22 0500 10/09/22 0500  Weight: 70.8 kg 69.1 kg 68.8 kg    Physical Examination  GENERAL: 89 year-old critically ill patient lying in the bed intubated and Sedated EYES: PEERLA. No scleral icterus. Extraocular muscles intact.  HEENT: Head atraumatic, normocephalic. Oropharynx and nasopharynx clear.  NECK:  No JVD, supple  LUNGS: Decreased breath sounds with moderate wheezing bilaterally.  Mild use of accessory muscles of respiration.  CARDIOVASCULAR: S1, S2 normal. No  murmurs, rubs, or gallops.  ABDOMEN: Soft, NTND EXTREMITIES: trace bilateral lower extremity edema. Capillary refill < 3 seconds in all extremities. Pulses palpable distally. NEUROLOGIC: The patient is intubated and sedated. No focal neurological  deficit appreciated. Cranial nerves are intact.  SKIN: No obvious rash, lesion, or ulcer. Warm to touch Labs/imaging that I havepersonally reviewed  (right click and "Reselect all SmartList Selections" daily)     Labs   CBC: Recent Labs  Lab 10/05/22 2257 10/06/22 0516 10/07/22 0408 10/08/22 0414 10/09/22 0613  WBC 8.1 12.6* 12.4* 14.0* 10.7*  NEUTROABS 5.3  --   --   --   --   HGB 14.4 14.2 13.0 12.3 11.6*  HCT 45.0 45.0 37.7 36.2 34.1*  MCV 95.1 95.5 87.5 91.2 90.0  PLT 263 258 231 239 245   Basic Metabolic Panel: Recent Labs  Lab 10/05/22 2257 10/06/22 0516 10/07/22 0408 10/08/22 0414 10/09/22 0613  NA 132* 133* 135 136 135  K 4.8 5.1 4.2 3.7 3.4*  CL 95* 98 98 95* 97*  CO2 26 23 26 28 28   GLUCOSE 136* 135* 148* 102* 92  BUN 30* 32* 42* 45* 40*  CREATININE 2.28* 2.38* 2.80* 2.37* 1.77*  CALCIUM 9.5 9.2 8.8* 8.8* 8.6*  MG  --   --  2.1 2.0 2.1  PHOS  --   --  4.5 4.2 3.6   GFR: Estimated Creatinine Clearance: 20.9 mL/min (A) (by C-G formula based on SCr of 1.77 mg/dL (H)). Recent Labs  Lab 10/05/22 2257 10/06/22 0106 10/06/22 0516 10/07/22 0408 10/07/22 0922 10/07/22 1244 10/08/22 0414 10/09/22 0613  PROCALCITON  --  <0.10  --   --   --   --   --   --   WBC 8.1  --  12.6* 12.4*  --   --  14.0* 10.7*  LATICACIDVEN 1.7 2.3*  --   --  1.5 1.2  --   --     Liver Function Tests: Recent Labs  Lab 10/05/22 2257 10/07/22 0408 10/08/22 0414 10/09/22 0613  AST 26 96*  --  60*  ALT 30 112*  --  107*  ALKPHOS 59 49  --  44  BILITOT 1.3* 1.1  --  0.8  PROT 7.0 6.0*  --  6.0*  ALBUMIN 3.8 3.0*  3.1* 3.1* 2.9*  2.9*   No results for input(s): "LIPASE", "AMYLASE" in the last 168 hours. No results for input(s): "AMMONIA" in the last 168 hours.  ABG    Component Value Date/Time   PHART 7.37 10/06/2022 0501   PCO2ART 42 10/06/2022 0501   PO2ART 51 (L) 10/06/2022 0501   HCO3 24.3 10/06/2022 0501   ACIDBASEDEF 1.1 10/06/2022 0501   O2SAT 73.1  10/07/2022 1245     Coagulation Profile: Recent Labs  Lab 10/05/22 2257 10/06/22 1111 10/07/22 0408  INR 2.3* 2.7* 2.4*   Cardiac Enzymes: No results for input(s): "CKTOTAL", "CKMB", "CKMBINDEX", "TROPONINI" in the last 168 hours.  HbA1C: Hgb A1c MFr Bld  Date/Time Value Ref Range Status  08/27/2022 05:25 AM 5.6 4.8 - 5.6 % Final    Comment:    (NOTE) Pre diabetes:          5.7%-6.4%  Diabetes:              >6.4%  Glycemic control for   <7.0% adults with diabetes     CBG: Recent Labs  Lab 10/08/22 1557 10/08/22 2012 10/09/22 0054 10/09/22 0433 10/09/22 0754  GLUCAP 116* 108* 91  111* 96    Review of Systems:   Unable to be obtained secondary to the patient's intubated and sedated status.   Past Medical History  She,  has a past medical history of Breast cancer (HCC) (06/05/1999), Hypertension, Personal history of radiation therapy, and Thyroid disease.   Surgical History    Past Surgical History:  Procedure Laterality Date   BREAST BIOPSY Left 06/05/99   lumpectomy/rad   BREAST LUMPECTOMY Left 2001   CHOLECYSTECTOMY       Social History   reports that she quit smoking about 54 years ago. Her smoking use included cigarettes. She started smoking about 64 years ago. She has a 10 pack-year smoking history. She has never used smokeless tobacco. She reports that she does not drink alcohol and does not use drugs.   Family History   Her family history includes Heart attack (age of onset: 43) in her father; Hypertension in her mother; Other in her mother. There is no history of Breast cancer.   Allergies No Known Allergies   Home Medications  Prior to Admission medications   Medication Sig Start Date End Date Taking? Authorizing Provider  amiodarone (PACERONE) 400 MG tablet Take 1 tablet (400 mg total) by mouth 2 (two) times daily. Take for 7 days. 09/26/22  Yes Gollan, Tollie Pizza, MD  apixaban (ELIQUIS) 5 MG TABS tablet Take 1 tablet (5 mg total) by mouth 2  (two) times daily. 08/29/22  Yes Arnetha Courser, MD  fluticasone (FLONASE) 50 MCG/ACT nasal spray Place 2 sprays into both nostrils daily.   Yes [provider]  furosemide (LASIX) 20 MG tablet Take 2 tablets (40 mg total) by mouth daily. May take an additional 40 mg ( 1 tablet) daily as needed for shortness of breath or abdominal swelling. 09/26/22  Yes Gollan, Tollie Pizza, MD  gabapentin (NEURONTIN) 300 MG capsule Take 300 mg by mouth at bedtime.   Yes [provider]  levothyroxine (SYNTHROID) 75 MCG tablet Take 75 mcg by mouth daily before breakfast.   Yes [provider]  metoprolol tartrate (LOPRESSOR) 25 MG tablet Take 1 tablet (25 mg total) by mouth 2 (two) times daily. 08/29/22  Yes Arnetha Courser, MD  Multiple Vitamin (MULTI-VITAMINS) TABS Take 1 tablet by mouth daily.   Yes [provider]  potassium chloride (KLOR-CON) 10 MEQ tablet Take 1 tablet (10 mEq total) by mouth daily. May take an additional dose 10 Meq ( 1 tablet) as needed with extra lasix. 09/26/22 12/25/22 Yes Gollan, Tollie Pizza, MD  pravastatin (PRAVACHOL) 40 MG tablet Take 40 mg by mouth at bedtime.   Yes [provider]  amiodarone (PACERONE) 200 MG tablet Take 1 tablet (200 mg total) by mouth 2 (two) times daily. Start taking after completing 7 days of 400 mg twice daily. 10/05/22   Antonieta Iba, MD  Scheduled Meds:  albuterol  2.5 mg Nebulization TID   apixaban  2.5 mg Oral BID   Chlorhexidine Gluconate Cloth  6 each Topical Daily   furosemide  80 mg Intravenous Once   [START ON 10/10/2022] furosemide  40 mg Oral Daily   mouth rinse  15 mL Mouth Rinse 4 times per day   potassium chloride  40 mEq Oral Q4H   predniSONE  20 mg Oral Q breakfast   sodium chloride flush  10-40 mL Intracatheter Q12H   Continuous Infusions:  sodium chloride Stopped (10/07/22 1056)   cefTRIAXone (ROCEPHIN)  IV 2 g (10/09/22 0817)   PRN Meds:.albuterol,  atropine, docusate sodium, mouth rinse, mouth  rinse, polyethylene glycol, sodium chloride flush  Active Hospital Problem list   See systems below  Assessment & Plan:  Symptomatic Bradycardia #Low output Cardiogenic shock #Acute on Chronic Decompensated HF~BNP significantly elevated #Atrial Fibrillation PMHx: HTN, HLD -EF 45-50% by echo on 08/27/22 -Unclear precipitating factor however TSH is significantly elevated maybe worsening symptoms -Cautious IV fluids -Vasopressors for MAP goal>65 -Trend lactic acid until normalized -Trend HS Troponin until peaked  -BNP is 1298 -Check Coox and CVP once central line placed -Echocardiogram -Hold Amiodarone and Metoprolol for now -Diuresis as BP and renal function permits ~ holding due to shock and AKI -Cardiology consulted -Hold Eliquis while NPO and start Heparin   #Acute Hypoxic Respiratory Failure in setting of Acute Decompensated HF, bilateral pleural effusions -s/p extubation  -Prn Bronchodilators   #Acute Kidney Injury on CKD stage III #Lactic Acidosis -Monitor I&O's / urinary output -Follow BMP -Ensure adequate renal perfusion -Avoid nephrotoxic agents as able -Replace electrolytes as indicated    URINARY TRACT INFECTION     -on rocephin     #Sedation needs in setting of mechanical ventilation -Maintain a RASS goal of 0 to -1 -Propofol if needed to maintain RASS goal ~ currently on NO sedation -Avoid sedating medications as able -Daily wake up assessment  #Severe Hypothyroidism Hx of Hypothyroidism on Synthroid likely primary in the setting of poorly controlled hypothyroid patient. Patient's daughter reports  she may have been missing her doses of Synthroid  Na 125, Glu144, T4,<0.25, TSH 200.017:AMS (lethargy), hypothermia, hypotension, s/s of HF -hydrocortisone 100mg  q8h  -check cortisol level -Levothyroxine (T4) Loading dose 75 mcg IV followed by 50-100 mcg IV QD until able to take T4 orally.  -Avoid aggressive IVFs   Best practice:  Diet:   NPO Pain/Anxiety/Delirium protocol (if indicated): Yes (RASS goal -1) VAP protocol (if indicated): Yes DVT prophylaxis: Systemic AC GI prophylaxis: N/A Glucose control:  SSI Yes Central venous access:  N/A Arterial line:  N/A Foley:  Yes, and it is still needed Mobility:  bed rest  PT consulted: N/A Last date of multidisciplinary goals of care discussion [9/30] Code Status:  full code Disposition: ICU   = Goals of Care = Code Status Order: FULL  Primary Emergency Contact: JEFFRIES,SHERRY Patient's dauighter who is her POA Wishes to pursue full aggressive treatment and intervention options, including CPR and intubation  Critical care provider statement:   Total critical care time: 33 minutes   Performed by: Karna Christmas MD   Critical care time was exclusive of separately billable procedures and treating other patients.   Critical care was necessary to treat or prevent imminent or life-threatening deterioration.   Critical care was time spent personally by me on the following activities: development of treatment plan with patient and/or surrogate as well as nursing, discussions with consultants, evaluation of patient's response to treatment, examination of patient, obtaining history from patient or surrogate, ordering and performing treatments and interventions, ordering and review of laboratory studies, ordering and review of radiographic studies, pulse oximetry and re-evaluation of patient's condition.    Vida Rigger, M.D.  Pulmonary & Critical Care Medicine

## 2022-10-09 NOTE — Evaluation (Addendum)
Physical Therapy Evaluation Patient Details Name: Nichole Hess MRN: [id number] DOB: 29-Aug-1933 Today's Date: 10/09/2022  History of Present Illness  89 y.o female with significant PMH of HTN, Ao atherosclerosis, hypothyroidism, and CKD III, HFmrEF, Atrial Fibrillation on Eliquis, and HLD who presented to the ED with chief complaints of progressive shortness of breath. Admit to ICU with Acute on Chronic Respiratory Failure secondary to CHF exacerbation, failed BiPAP requiring intubation.  Clinical Impression  Pt was pleasant and motivated to participate during the session and put forth good effort throughout. Pt found in recliner finishing up working with OT. She is supervision for STS transfers, no cues needed for hand placement. Pt is also supervision for ambulation, but needs +2 for line/lead management and chair follow. Overall taking confident, steady steps. Pt needing light cues for RW placement and upright posture. No imbalance or SOB noted, vitals remained WFL throughout session. Pt will benefit from continued PT services upon discharge to safely address deficits listed in patient problem list for decreased caregiver assistance and eventual return to PLOF.          If plan is discharge home, recommend the following: A little help with walking and/or transfers;A little help with bathing/dressing/bathroom;Assistance with cooking/housework;Assist for transportation;Help with stairs or ramp for entrance   Can travel by private vehicle        Equipment Recommendations None recommended by PT;Other (comment);Rolling walker (2 wheels) (May benefit from RW; pt unsure of she has one)  Recommendations for Other Services       Functional Status Assessment Patient has had a recent decline in their functional status and demonstrates the ability to make significant improvements in function in a reasonable and predictable amount of time.     Precautions / Restrictions Precautions Precautions:  Fall Restrictions Weight Bearing Restrictions: No      Mobility  Bed Mobility               General bed mobility comments: Pt found in recliner finishing up with OT upon entry.    Transfers Overall transfer level: Needs assistance Equipment used: Rolling walker (2 wheels) Transfers: Sit to/from Stand Sit to Stand: Supervision           General transfer comment: slow but needs no physical assist    Ambulation/Gait Ambulation/Gait assistance: Supervision, +2 safety/equipment Gait Distance (Feet): 75 Feet Assistive device: Rolling walker (2 wheels) Gait Pattern/deviations: Step-through pattern, Decreased step length - right, Decreased step length - left, Decreased stride length, Trunk flexed Gait velocity: decreased     General Gait Details: Supervision assist, but needs +2 for line/lead management and chair follow. VC's provided for her to staty within the walker, she tends to push RW out in front.  Stairs            Wheelchair Mobility     Tilt Bed    Modified Rankin (Stroke Patients Only)       Balance Overall balance assessment: Needs assistance Sitting-balance support: Feet supported, Bilateral upper extremity supported Sitting balance-Leahy Scale: Good Sitting balance - Comments: sitting in recliner, back off of recliner     Standing balance-Leahy Scale: Good Standing balance comment: static with RW is stable                             Pertinent Vitals/Pain Pain Assessment Pain Assessment: Faces Faces Pain Scale: No hurt    Home Living Family/patient expects to be discharged to:: Private residence  Living Arrangements: Children Available Help at Discharge: Family Type of Home: House Home Access: Stairs to enter Entrance Stairs-Rails: Left Entrance Stairs-Number of Steps: 3   Home Layout: One level Home Equipment: Cane - single point;Shower seat Additional Comments: Can't remember if she has RW or not    Prior  Function Prior Level of Function : Independent/Modified Independent             Mobility Comments: PRN use of SPC around the house, household ambulator ADLs Comments: Per prior chart documentation: daughter assists with driving/cooking/cleaning.     Extremity/Trunk Assessment   Upper Extremity Assessment Upper Extremity Assessment: Defer to OT evaluation    Lower Extremity Assessment Lower Extremity Assessment: Generalized weakness       Communication   Communication Communication: No apparent difficulties  Cognition Arousal: Alert Behavior During Therapy: WFL for tasks assessed/performed Overall Cognitive Status: Within Functional Limits for tasks assessed                                          General Comments      Exercises     Assessment/Plan    PT Assessment Patient needs continued PT services  PT Problem List Decreased strength;Decreased mobility;Decreased range of motion;Decreased coordination       PT Treatment Interventions DME instruction;Gait training;Stair training;Functional mobility training;Therapeutic activities;Therapeutic exercise;Balance training    PT Goals (Current goals can be found in the Care Plan section)  Acute Rehab PT Goals Patient Stated Goal: get home PT Goal Formulation: With patient Time For Goal Achievement: 10/22/22 Potential to Achieve Goals: Good    Frequency Min 1X/week     Co-evaluation               AM-PAC PT "6 Clicks" Mobility  Outcome Measure Help needed turning from your back to your side while in a flat bed without using bedrails?: A Little Help needed moving from lying on your back to sitting on the side of a flat bed without using bedrails?: A Little Help needed moving to and from a bed to a chair (including a wheelchair)?: A Little Help needed standing up from a chair using your arms (e.g., wheelchair or bedside chair)?: A Little Help needed to walk in hospital room?: A  Little Help needed climbing 3-5 steps with a railing? : A Little 6 Click Score: 18    End of Session Equipment Utilized During Treatment: Gait belt Activity Tolerance: Patient tolerated treatment well Patient left: in chair;with call bell/phone within reach Nurse Communication: Mobility status PT Visit Diagnosis: Difficulty in walking, not elsewhere classified (R26.2);Other abnormalities of gait and mobility (R26.89)    Time: 7829-5621 PT Time Calculation (min) (ACUTE ONLY): 24 min   Charges:                 Cecile Sheerer, SPT 10/09/22, 1:05 PM This entire session was performed under direct supervision and direction of a licensed therapist/therapist assistant. I have personally read, edited and approve of the note as written.   Loran Senters, DPT

## 2022-10-09 NOTE — Progress Notes (Signed)
Nutrition Follow Up Note   DOCUMENTATION CODES:   Non-severe (moderate) malnutrition in context of chronic illness  INTERVENTION:   Ensure Enlive po TID, each supplement provides 350 kcal and 20 grams of protein.  Magic cup TID with meals, each supplement provides 290 kcal and 9 grams of protein  MVI po daily   NUTRITION DIAGNOSIS:   Moderate Malnutrition related to chronic illness as evidenced by moderate fat depletion, moderate muscle depletion. -ongoing   GOAL:   Patient will meet greater than or equal to 90% of their needs -previously met with tube feeds   MONITOR:   PO intake, Supplement acceptance, Labs, Weight trends, I & O's, Skin  ASSESSMENT:   89 y/o female with h/o HTN, CKD III, breast cancer s/p lumpectomy, hypothyroidism, HLD, trigeminal neuralgia, trigeminal neuralgia and newly diagnosed afib who is admitted with AKI, UTI, bradycardia, CHF and cardiogenic shock.  Pt extubated 10/2. Pt working with therapy at time of RD visit this morning. Pt initiated on a regular diet. Pt ate 20% of her breakfast this morning. RD will had supplements and MVI to help pt meet her estimated needs. Per chart, pt is down ~10lbs since admission but appears to be back at her UBW.   Medications reviewed and include: lasix, prednisone, ceftriaxone  Labs reviewed: K 3.4(L), BUN 40(H), creat 1.77(H), P 3.6 wnl, Mg 2.1 wnl BNP- 1298.0(H)- 9/29 Wbc- 10.7(H) Cbgs- 125, 96, 111, 91 x 24 hrs   Diet Order:   Diet Order             Diet regular Room service appropriate? Yes; Fluid consistency: Thin  Diet effective now                  EDUCATION NEEDS:   No education needs have been identified at this time  Skin:  Skin Assessment: Reviewed RN Assessment (ecchymosis)  Last BM:  1-/3- type 2  Height:   Ht Readings from Last 1 Encounters:  10/06/22 5\' 3"  (1.6 m)    Weight:   Wt Readings from Last 1 Encounters:  10/09/22 68.8 kg    Ideal Body Weight:  52.2 kg  BMI:   Body mass index is 26.87 kg/m.  Estimated Nutritional Needs:   Kcal:  1400-1600kcal/day  Protein:  70-80g/day  Fluid:  1.3-1.5L/day  Betsey Holiday MS, RD, LDN Please refer to [facility] for RD and/or RD on-call/weekend/after hours pager

## 2022-10-09 NOTE — Progress Notes (Addendum)
Patient ID: Nichole Hess, female   DOB: 1933-01-12, 89 y.o.   MRN: [id number]     Advanced Heart Failure Rounding Note  PCP-Cardiologist: Julien Nordmann, MD   Subjective:    Extubated 10/1.   Now off DBA. CVP 12 today. Creatinine 2.8 => 2.37>1.77.    Feels fine this morning, has not gotten up OOB.   Objective:   Weight Range: 68.8 kg Body mass index is 26.87 kg/m.   Vital Signs:   Temp:  [97.6 F (36.4 C)] 97.6 F (36.4 C) (10/02 1930) Pulse Rate:  [50-59] 51 (10/03 0700) Resp:  [10-26] 12 (10/03 0700) BP: (104-150)/(43-82) 135/63 (10/03 0700) SpO2:  [92 %-99 %] 98 % (10/03 0700) Weight:  [68.8 kg] 68.8 kg (10/03 0500) Last BM Date :  (PTA)  Weight change: Filed Weights   10/07/22 0354 10/08/22 0500 10/09/22 0500  Weight: 70.8 kg 69.1 kg 68.8 kg    Intake/Output:   Intake/Output Summary (Last 24 hours) at 10/09/2022 0733 Last data filed at 10/08/2022 2207 Gross per 24 hour  Intake 468.2 ml  Output 1000 ml  Net -531.8 ml     CVP 12 Physical Exam  General:  elderly appearing.  No respiratory difficulty HEENT: HOH Neck: supple. JVD ~12 cm. Carotids 2+ bilat; no bruits. No lymphadenopathy or thyromegaly appreciated. Cor: PMI nondisplaced. brady rate & regular rhythm. No rubs, gallops or murmurs. Lungs: clear Abdomen: soft, nontender, nondistended. No hepatosplenomegaly. No bruits or masses. Good bowel sounds. Extremities: no cyanosis, clubbing, rash, +1 BLE edema. PICC LUE Neuro: alert & oriented x 3, cranial nerves grossly intact. moves all 4 extremities w/o difficulty. Affect pleasant.   Telemetry   SB 50s (personally reviewed)  Labs    CBC Recent Labs    10/08/22 0414 10/09/22 0613  WBC 14.0* 10.7*  HGB 12.3 11.6*  HCT 36.2 34.1*  MCV 91.2 90.0  PLT 239 245   Basic Metabolic Panel Recent Labs    40/98/11 0414 10/09/22 0613  NA 136 135  K 3.7 3.4*  CL 95* 97*  CO2 28 28  GLUCOSE 102* 92  BUN 45* 40*  CREATININE 2.37* 1.77*  CALCIUM  8.8* 8.6*  MG 2.0 2.1  PHOS 4.2 3.6   Liver Function Tests Recent Labs    10/07/22 0408 10/08/22 0414 10/09/22 0613  AST 96*  --  60*  ALT 112*  --  107*  ALKPHOS 49  --  44  BILITOT 1.1  --  0.8  PROT 6.0*  --  6.0*  ALBUMIN 3.0*  3.1* 3.1* 2.9*  2.9*   No results for input(s): "LIPASE", "AMYLASE" in the last 72 hours. Cardiac Enzymes No results for input(s): "CKTOTAL", "CKMB", "CKMBINDEX", "TROPONINI" in the last 72 hours.  BNP: BNP (last 3 results) Recent Labs    08/26/22 1817 09/23/22 1339 10/05/22 2257  BNP 618.6* 1,066.5* 1,298.0*    ProBNP (last 3 results) No results for input(s): "PROBNP" in the last 8760 hours.   D-Dimer No results for input(s): "DDIMER" in the last 72 hours.  Hemoglobin A1C No results for input(s): "HGBA1C" in the last 72 hours. Fasting Lipid Panel Recent Labs    10/07/22 0828  CHOL 115  HDL 42  LDLCALC 63  TRIG 51  CHOLHDL 2.7   Thyroid Function Tests Recent Labs    10/07/22 0922  T3FREE 0.9*    Other results:   Imaging    No results found.   Medications:     Scheduled Medications:  albuterol  2.5 mg Nebulization TID   Chlorhexidine Gluconate Cloth  6 each Topical Daily   furosemide  80 mg Intravenous Once   [START ON 10/10/2022] furosemide  40 mg Oral Daily   mouth rinse  15 mL Mouth Rinse 4 times per day   predniSONE  20 mg Oral Q breakfast   sodium chloride flush  10-40 mL Intracatheter Q12H    Infusions:  sodium chloride Stopped (10/07/22 1056)   cefTRIAXone (ROCEPHIN)  IV Stopped (10/08/22 0911)   heparin 700 Units/hr (10/08/22 2207)    PRN Medications: albuterol, atropine, docusate sodium, mouth rinse, mouth rinse, polyethylene glycol, sodium chloride flush   Assessment/Plan  1. Atrial fibrillation: She was in sinus bradycardia at admission, HR in 40s initially, now in 50s-60s.  Now off DBA - Stop hep gtt, transition back to Eliquis 2.5 mg BID  - No nodal blockade for now, may eventually be  able to tolerate low dose amiodarone but will have to see.  2. Acute on chronic HFmREF: Echo this admission showed EF 50%, mild LVH, normal RV, mild AI, mild-moderate MR, mild-moderate TR.  She was admitted with volume overload/pulmonary edema, BNP 1298 and AKI. This morning, creatinine 2.8 => 2.37>1.77.  She had lactate up to 2.3 in setting of hypotension/bradycardia/hypoxemia, decreased to 1.2 yesterday.  Now off DBA and NE. HR 50s- 60s. CVP up to 12 today - Stable off DBA - CVP 12, will do 80 IV lasix today. Can start 40 PO lasix tomorrow - GDMT when renal function improves. Add SGLT2i when foley removed.  - Shock resolved, wean down hydrocortisone.  3. AKI on CKD stage 3: Baseline creatinine looks to be around 1.3.  Up to 2.8 in setting of hypotension/bradycardia.  Suspect ATN.  Hopefully this will improve with time and maintenance of adequate BP => creatinine down to 1.77 today.   4. Abnormal thyroid panel: TSH mildly elevated at 11, free T4 also elevated with low free T3.  Suspect sick euthyroid.  Recheck as outpatient.  5. Acute hypoxemic respiratory failure: In setting of pulmonary edema.  With PCT <0.1, doubt PNA. Resolved.  - Remains on ceftriaxone per CCM.  6. Valvular heart disease: Echo this admission less impressive with mild-moderate MR and mild-moderate TR.   Length of Stay: 3  Alen Bleacher, NP  10/09/2022, 7:33 AM  Advanced Heart Failure Team Pager [phone] (M-F; 7a - 5p)  Please contact [facility] for night-coverage after hours (5p -7a ) and weekends on [email or web address]  Patient seen with NP, agree with the above note.   Now off dobutamine, SBP 140s with HR 60s, sinus.  CVP 11-12.  Has a cough.   General: NAD Neck: JVP 10-12, no thyromegaly or thyroid nodule.  Lungs: Clear to auscultation bilaterally with normal respiratory effort. CV: Nondisplaced PMI.  Heart regular S1/S2, no S3/S4, 1/6 SEM RUSB.  No peripheral edema.    Abdomen: Soft, nontender, no hepatosplenomegaly, no  distention.  Skin: Intact without lesions or rashes.  Neurologic: Alert and oriented x 3.  Psych: Normal affect. Extremities: No clubbing or cyanosis.  HEENT: Normal.   Mild volume overload, Lasix 80 mg IV bid x 2 doses today, probably to po tomorrow.  Would add SGLT2 inhibitor when foley out.   Renal function gradually improving, creatinine 1.77.   HR now stable in NSR 60s, consider low dose amiodarone eventually for NSR maintenance.   Stop heparin gtt, start Eliquis.   Mobilize, PT.   Marca Ancona 10/09/2022 8:26 AM

## 2022-10-10 DIAGNOSIS — J9621 Acute and chronic respiratory failure with hypoxia: Secondary | ICD-10-CM | POA: Diagnosis not present

## 2022-10-10 DIAGNOSIS — I5033 Acute on chronic diastolic (congestive) heart failure: Secondary | ICD-10-CM | POA: Diagnosis not present

## 2022-10-10 LAB — RENAL FUNCTION PANEL
Albumin: 3.3 g/dL — ABNORMAL LOW (ref 3.5–5.0)
Anion gap: 11 (ref 5–15)
BUN: 36 mg/dL — ABNORMAL HIGH (ref 8–23)
CO2: 32 mmol/L (ref 22–32)
Calcium: 9.3 mg/dL (ref 8.9–10.3)
Chloride: 95 mmol/L — ABNORMAL LOW (ref 98–111)
Creatinine, Ser: 1.41 mg/dL — ABNORMAL HIGH (ref 0.44–1.00)
GFR, Estimated: 36 mL/min — ABNORMAL LOW (ref >60)
Glucose, Bld: 89 mg/dL (ref 70–99)
Phosphorus: 1.9 mg/dL — ABNORMAL LOW (ref 2.5–4.6)
Potassium: 3.7 mmol/L (ref 3.5–5.1)
Sodium: 138 mmol/L (ref 135–145)

## 2022-10-10 LAB — CBC
HCT: 39.2 % (ref 36.0–46.0)
Hemoglobin: 12.9 g/dL (ref 12.0–15.0)
MCH: 30.6 pg (ref 26.0–34.0)
MCHC: 32.9 g/dL (ref 30.0–36.0)
MCV: 93.1 fL (ref 80.0–100.0)
Platelets: 271 10*3/uL (ref 150–400)
RBC: 4.21 MIL/uL (ref 3.87–5.11)
RDW: 15 % (ref 11.5–15.5)
WBC: 11 10*3/uL — ABNORMAL HIGH (ref 4.0–10.5)
nRBC: 0 % (ref 0.0–0.2)

## 2022-10-10 LAB — GLUCOSE, CAPILLARY
Glucose-Capillary: 101 mg/dL — ABNORMAL HIGH (ref 70–99)
Glucose-Capillary: 110 mg/dL — ABNORMAL HIGH (ref 70–99)
Glucose-Capillary: 112 mg/dL — ABNORMAL HIGH (ref 70–99)
Glucose-Capillary: 73 mg/dL (ref 70–99)
Glucose-Capillary: 82 mg/dL (ref 70–99)
Glucose-Capillary: 89 mg/dL (ref 70–99)

## 2022-10-10 LAB — CULTURE, BLOOD (SINGLE)
Culture: NO GROWTH
Special Requests: ADEQUATE

## 2022-10-10 LAB — MAGNESIUM: Magnesium: 2.2 mg/dL (ref 1.7–2.4)

## 2022-10-10 LAB — APTT: aPTT: 30 s (ref 24–36)

## 2022-10-10 MED ORDER — LEVOTHYROXINE SODIUM 50 MCG PO TABS
75.0000 ug | ORAL_TABLET | Freq: Every day | ORAL | Status: DC
  Administered 2022-10-11: 75 ug via ORAL
  Filled 2022-10-10: qty 2

## 2022-10-10 MED ORDER — POTASSIUM PHOSPHATES 15 MMOLE/5ML IV SOLN
30.0000 mmol | Freq: Once | INTRAVENOUS | Status: AC
  Administered 2022-10-10: 30 mmol via INTRAVENOUS
  Filled 2022-10-10: qty 10

## 2022-10-10 MED ORDER — FUROSEMIDE 40 MG PO TABS
40.0000 mg | ORAL_TABLET | Freq: Two times a day (BID) | ORAL | Status: DC
  Administered 2022-10-10 – 2022-10-11 (×3): 40 mg via ORAL
  Filled 2022-10-10 (×3): qty 1

## 2022-10-10 MED ORDER — POTASSIUM CHLORIDE CRYS ER 20 MEQ PO TBCR
40.0000 meq | EXTENDED_RELEASE_TABLET | Freq: Once | ORAL | Status: AC
  Administered 2022-10-10: 40 meq via ORAL
  Filled 2022-10-10: qty 2

## 2022-10-10 MED ORDER — POTASSIUM CHLORIDE CRYS ER 20 MEQ PO TBCR
40.0000 meq | EXTENDED_RELEASE_TABLET | Freq: Once | ORAL | Status: DC
  Filled 2022-10-10: qty 2

## 2022-10-10 MED ORDER — CEFUROXIME AXETIL 500 MG PO TABS
250.0000 mg | ORAL_TABLET | Freq: Two times a day (BID) | ORAL | Status: DC
  Filled 2022-10-10: qty 0.5

## 2022-10-10 MED ORDER — PRAVASTATIN SODIUM 20 MG PO TABS
40.0000 mg | ORAL_TABLET | Freq: Every day | ORAL | Status: DC
  Administered 2022-10-10: 40 mg via ORAL
  Filled 2022-10-10: qty 2

## 2022-10-10 MED ORDER — CEFUROXIME AXETIL 500 MG PO TABS
250.0000 mg | ORAL_TABLET | Freq: Two times a day (BID) | ORAL | Status: DC
  Administered 2022-10-10 – 2022-10-11 (×3): 250 mg via ORAL
  Filled 2022-10-10 (×2): qty 1
  Filled 2022-10-10: qty 0.5
  Filled 2022-10-10: qty 1
  Filled 2022-10-10: qty 0.5
  Filled 2022-10-10: qty 1
  Filled 2022-10-10: qty 0.5

## 2022-10-10 MED ORDER — GABAPENTIN 300 MG PO CAPS
300.0000 mg | ORAL_CAPSULE | Freq: Every day | ORAL | Status: DC
  Administered 2022-10-10: 300 mg via ORAL
  Filled 2022-10-10: qty 1

## 2022-10-10 MED ORDER — PRAVASTATIN SODIUM 20 MG PO TABS: 40.0000 mg | ORAL_TABLET | Freq: Every day | ORAL | Status: DC

## 2022-10-10 MED ORDER — SPIRONOLACTONE 12.5 MG HALF TABLET
12.5000 mg | ORAL_TABLET | Freq: Every day | ORAL | Status: DC
  Administered 2022-10-10 – 2022-10-11 (×2): 12.5 mg via ORAL
  Filled 2022-10-10 (×2): qty 1

## 2022-10-10 NOTE — Plan of Care (Addendum)
PMT following. Patient stated during previous visit that she has never considered her goals of care or any boundaries to care, and would need to do so.    In to revisit patient. Patient has been moved to a semi private room. Due to the nature of GOC conversations and the need for a quiet atmosphere, without distractions for extended periods of time, PMT will be happy to have further GOC discussions if patient is able to return to a private room.

## 2022-10-10 NOTE — Plan of Care (Signed)

## 2022-10-10 NOTE — Progress Notes (Signed)
Physical Therapy Treatment Patient Details Name: Nichole Hess MRN: [id number] DOB: 12/29/1933 Today's Date: 10/10/2022   History of Present Illness 89 y.o female with significant PMH of HTN, Ao atherosclerosis, hypothyroidism, and CKD III, HFmrEF, Atrial Fibrillation on Eliquis, and HLD who presented to the ED with chief complaints of progressive shortness of breath. Admit to ICU with Acute on Chronic Respiratory Failure secondary to CHF exacerbation, failed BiPAP requiring intubation.    PT Comments  Pt was pleasant and motivated to participate during the session and put forth good effort throughout. Pt is Mod I for bed mobility, and supervision for transfers and gait. Pt able to stand with self selected strategy using RW. Once up pt able to walk 210 feet with RW, no imbalance or SOB noted. Cues provided to keep RW close and maintain upright posture. Additional cues provided for pt to take slightly wider steps for improved BOS.  Pt will benefit from continued PT services upon discharge to safely address deficits listed in patient problem list for decreased caregiver assistance and eventual return to PLOF.     If plan is discharge home, recommend the following: A little help with walking and/or transfers;A little help with bathing/dressing/bathroom;Assistance with cooking/housework;Assist for transportation;Help with stairs or ramp for entrance   Can travel by private vehicle        Equipment Recommendations  None recommended by PT;Other (comment);Rolling walker (2 wheels) (May benefit from RW; pt unsure if she has one)    Recommendations for Other Services       Precautions / Restrictions Precautions Precautions: Fall Restrictions Weight Bearing Restrictions: No     Mobility  Bed Mobility Overal bed mobility: Modified Independent                  Transfers Overall transfer level: Needs assistance Equipment used: Rolling walker (2 wheels) Transfers: Sit to/from  Stand Sit to Stand: Supervision   Step pivot transfers: Contact guard assist            Ambulation/Gait Ambulation/Gait assistance: Supervision, +2 safety/equipment Gait Distance (Feet): 210 Feet Assistive device: Rolling walker (2 wheels) Gait Pattern/deviations: Step-through pattern, Decreased step length - right, Decreased step length - left, Decreased stride length Gait velocity: decreased     General Gait Details: Pt taking steady steps, no SOB or imbalance noted, light intermitrtent cues for RW management and to widen stance for improved BOS   Stairs             Wheelchair Mobility     Tilt Bed    Modified Rankin (Stroke Patients Only)       Balance Overall balance assessment: Needs assistance Sitting-balance support: Feet supported, Bilateral upper extremity supported Sitting balance-Leahy Scale: Normal     Standing balance support: No upper extremity supported, During functional activity Standing balance-Leahy Scale: Good Standing balance comment: static with RW                            Cognition Arousal: Alert Behavior During Therapy: WFL for tasks assessed/performed Overall Cognitive Status: Within Functional Limits for tasks assessed                                          Exercises      General Comments        Pertinent Vitals/Pain Pain Assessment Pain Assessment: No/denies  pain    Home Living                          Prior Function            PT Goals (current goals can now be found in the care plan section) Progress towards PT goals: Progressing toward goals    Frequency    Min 1X/week      PT Plan      Co-evaluation              AM-PAC PT "6 Clicks" Mobility   Outcome Measure  Help needed turning from your back to your side while in a flat bed without using bedrails?: A Little Help needed moving from lying on your back to sitting on the side of a flat bed without  using bedrails?: A Little Help needed moving to and from a bed to a chair (including a wheelchair)?: A Little Help needed standing up from a chair using your arms (e.g., wheelchair or bedside chair)?: A Little Help needed to walk in hospital room?: A Little Help needed climbing 3-5 steps with a railing? : A Little 6 Click Score: 18    End of Session Equipment Utilized During Treatment: Gait belt Activity Tolerance: Patient tolerated treatment well Patient left: in bed;with nursing/sitter in room;with call bell/phone within reach;with bed alarm set (Seated EOB to eat meal) Nurse Communication: Mobility status;Other (comment) (Pt position) PT Visit Diagnosis: Difficulty in walking, not elsewhere classified (R26.2);Other abnormalities of gait and mobility (R26.89)     Time: 2536-6440 PT Time Calculation (min) (ACUTE ONLY): 22 min  Charges:                            Cecile Sheerer, SPT 10/10/22, 5:10 PM

## 2022-10-10 NOTE — Progress Notes (Signed)
OT dose of IV lasix 80mg  was delayed in being administered due to my concern of potential hypokalemia since the last potassium lab was in the morning of 10/09/22 and the potassium level was 3.4. Dose was ordered for 8pm 10/09/22, but was administered at 1246am 10/10/22 after confirming ok to give with NP [facility] [place] [facility]. I will explain course of events resulting in delay. I initially paged Dr. Vida Rigger with Pulmonary Disease (Primary Team) at 908pm. Got caught up with med pass. Dr. Karna Christmas acknowledged my message on Epic at 956pm (his face icon moved down under the text, showing he opened the message page), however did not respond with a message or put in any order changes. I messaged again at 1030pm but got no response. I then tried to page Cardiology (Dr. Shirlee Latch with Cardiology put the lasix order in). The computer list showed Dr. Jeanie Cooks was on call for the night, but later in the morning found out that was incorrect. Regardless, at the time, I sent him an Epic message at 1118pm and called/left a voicemail. I got caught up in another med pass and also assisted with a rapid response. NP Manuela Schwartz with Internal Medicine was there for the rapid response and after resolving that, since I didn't get in contact with either doctors, I asked her for her opinion on the matter. NP Jon Billings then pointed out to me that the Cardiology team was actually being covered by someone else and directed me to NP [facility]. I messaged NP [place] at 1237am and after discussing it over with her, was in agreement to give the IV lasix.

## 2022-10-10 NOTE — Plan of Care (Signed)

## 2022-10-10 NOTE — Consult Note (Signed)
PHARMACY CONSULT NOTE - ELECTROLYTES  Pharmacy Consult for Electrolyte Monitoring and Replacement   Recent Labs: Height: 5\' 3"  (160 cm) Weight: 67 kg (147 lb 11.2 oz) IBW/kg (Calculated) : 52.4 Estimated Creatinine Clearance: 25.8 mL/min (A) (by C-G formula based on SCr of 1.41 mg/dL (H)). Potassium (mmol/L)  Date Value  10/10/2022 3.7  06/06/2011 4.0   Magnesium (mg/dL)  Date Value  40/98/1191 2.2   Calcium (mg/dL)  Date Value  47/82/9562 9.3   Calcium, Total (mg/dL)  Date Value  13/08/6576 9.3   Albumin (g/dL)  Date Value  46/96/2952 3.3 (L)  06/06/2011 3.6   Phosphorus (mg/dL)  Date Value  84/13/2440 1.9 (L)   Sodium (mmol/L)  Date Value  10/10/2022 138  09/26/2022 134  06/06/2011 138    Assessment  Nichole Hess is a 89 y.o. female presenting with shortness of breath. PMH significant for HTN, Ao atherosclerosis, hypothyroidism, and CKD III, HFmrEF, Atrial Fibrillation on Eliquis, and HLD  . Pharmacy has been consulted to monitor and replace electrolytes.  Diet: Regular Pertinent medications: PO Lasix 40 mg daily  Goal of Therapy: Electrolytes WNL  Plan:  Phos 1.9, potassium phosphate 30 mmol IV x 1 dose per PCCM team Patient care transferred from PCCM to [facility]. Will discontinue electrolyte consult at this time. Defer further ordering of labs and electrolyte replacement to primary team Pharmacy will continue to follow along peripherally  Thank you for allowing pharmacy to be a part of this patient's care.  Tressie Ellis 10/10/2022 7:21 AM

## 2022-10-10 NOTE — Progress Notes (Signed)
PROGRESS NOTE    Nichole Hess  NUU:[id number] DOB: February 03, 1933 DOA: 10/05/2022 PCP: Luciana Axe, NP    Brief Narrative:  89 y.o female with significant PMH of HTN, Ao atherosclerosis, hypothyroidism, and CKD III, HFmrEF, Atrial Fibrillation on Eliquis, and HLD who presented to the ED with chief complaints of progressive shortness of breath.   On review of the chart, patient was hospitalized at [facility] on 8/20 to 8/22 for new onset atrial fibrillation with RVR and acute HFmrEF in the setting of A-fib with RVR.  She was started on beta-blocker, Eliquis and discharged on Lasix.  Since discharge she followed with her cardiologist on 09/12/2022 who continued her metoprolol and Lasix but held Coreg, losartan and amlodipine due to hypotension.  Patient returned to the ED on 9/17 with shortness of breath and weight gain.  She was again diuresed and discharged home to follow-up with her cardiologist.  Patient saw her cardiologist again on 09/26/2022 who recommended starting amiodarone for rate control and possible cardioversion if she remained in A-fib.  She was also continued Eliquis 5 mg twice daily and her Lasix increased to 240 mg daily an additional 40 in the afternoon for shortness of breath or abdominal swelling.  Patient's developed worsening shortness of breath yesterday afternoon with associated orthopnea and increased work of breathing which prompted her to come to the ED. Denies fevers, chills, n/v or chest pain.   10/07/22- patient failed weaning extubation trial with apnea.  We will perform CPAP trials today for chest physiotherapy.  Patient remains on Dobutamine drip. 10/08/22- patient s/p liberation from mechanical ventilation.  Off BIPAP, for SLP today.  Weaning off dobutamine , will remove PICC.  Narrowing abx to PO post swallow eval.  PT/OT.  10/09/22-  patient stable overnight, Optimizing for [facility] transfer 10/10/22-care assumed by [facility] hospitalist service.  No acute events overnight.  Patient  doing relatively well this morning.    Assessment & Plan:   Principal Problem:   Acute on chronic respiratory failure with hypoxemia (HCC) Active Problems:   Malnutrition of moderate degree   Goals of care, counseling/discussion  Atrial fibrillation with slow ventricular response Bradycardic on admission.  Heart rate 30s to 40s.  Now up to 50s. Plan: Continue Eliquis reduced dose 2.5 twice daily Hold all AV nodal blockers  Acute on chronic heart failure with mildly reduced ejection fraction EF 50%, normal RV Diuresing well Not overtly fluid overloaded Plan: Lasix p.o. 40 mg twice daily Spironolactone 12.5 mg daily  AKI on CKD stage IIIa Creatinine at or near baseline Monitor while on diuretic  Acute hypoxemic respiratory failure Down to 1 L.  Wean as tolerated  UTI Status post 3 doses of ceftriaxone  Essential hypertension Avoid AV nodal blocking agents Lasix as above Aldactone as above  Hyperlipidemia Pravastatin    DVT prophylaxis: Apixaban Code Status: Full Family Communication: Left voicemail for daughter Cordelia Pen [phone] on 10/4 Disposition Plan: Status is: Inpatient Remains inpatient appropriate because: Multiple acute issues as above.  Optimizing for discharge 10/5   Level of care: Med-Surg  Consultants:  Heart failure  Procedures:  None  Antimicrobials: None   Subjective: Seen and appear resting in bed.  No visible distress.  Answer questions appropriately.  Objective: Vitals:   10/10/22 0405 10/10/22 0500 10/10/22 0724 10/10/22 0750  BP: (!) 149/73   139/62  Pulse: (!) 52   (!) 54  Resp: 18   18  Temp: 97.7 F (36.5 C)   97.9 F (36.6 C)  TempSrc: Oral   Oral  SpO2: 97%  98% 93%  Weight:  67 kg    Height:        Intake/Output Summary (Last 24 hours) at 10/10/2022 1330 Last data filed at 10/10/2022 1100 Gross per 24 hour  Intake 240 ml  Output 2900 ml  Net -2660 ml   Filed Weights   10/08/22 0500 10/09/22 0500  10/10/22 0500  Weight: 69.1 kg 68.8 kg 67 kg    Examination:  General exam: Appears calm and comfortable  Respiratory system: Clear to auscultation. Respiratory effort normal. Cardiovascular system: S1-S2, RRR, no murmurs, no pedal edema Gastrointestinal system: Soft, NT/ND, normal bowel sounds GU: Foley Central nervous system: Alert and oriented. No focal neurological deficits. Extremities: Symmetric 5 x 5 power. Skin: No rashes, lesions or ulcers Psychiatry: Judgement and insight appear normal. Mood & affect appropriate.     Data Reviewed: I have personally reviewed following labs and imaging studies  CBC: Recent Labs  Lab 10/05/22 2257 10/06/22 0516 10/07/22 0408 10/08/22 0414 10/09/22 0613 10/10/22 0451  WBC 8.1 12.6* 12.4* 14.0* 10.7* 11.0*  NEUTROABS 5.3  --   --   --   --   --   HGB 14.4 14.2 13.0 12.3 11.6* 12.9  HCT 45.0 45.0 37.7 36.2 34.1* 39.2  MCV 95.1 95.5 87.5 91.2 90.0 93.1  PLT 263 258 231 239 245 271   Basic Metabolic Panel: Recent Labs  Lab 10/06/22 0516 10/07/22 0408 10/08/22 0414 10/09/22 0613 10/10/22 0451  NA 133* 135 136 135 138  K 5.1 4.2 3.7 3.4* 3.7  CL 98 98 95* 97* 95*  CO2 23 26 28 28  32  GLUCOSE 135* 148* 102* 92 89  BUN 32* 42* 45* 40* 36*  CREATININE 2.38* 2.80* 2.37* 1.77* 1.41*  CALCIUM 9.2 8.8* 8.8* 8.6* 9.3  MG  --  2.1 2.0 2.1 2.2  PHOS  --  4.5 4.2 3.6 1.9*   GFR: Estimated Creatinine Clearance: 25.8 mL/min (A) (by C-G formula based on SCr of 1.41 mg/dL (H)). Liver Function Tests: Recent Labs  Lab 10/05/22 2257 10/07/22 0408 10/08/22 0414 10/09/22 0613 10/10/22 0451  AST 26 96*  --  60*  --   ALT 30 112*  --  107*  --   ALKPHOS 59 49  --  44  --   BILITOT 1.3* 1.1  --  0.8  --   PROT 7.0 6.0*  --  6.0*  --   ALBUMIN 3.8 3.0*  3.1* 3.1* 2.9*  2.9* 3.3*   No results for input(s): "LIPASE", "AMYLASE" in the last 168 hours. No results for input(s): "AMMONIA" in the last 168 hours. Coagulation  Profile: Recent Labs  Lab 10/05/22 2257 10/06/22 1111 10/07/22 0408  INR 2.3* 2.7* 2.4*   Cardiac Enzymes: No results for input(s): "CKTOTAL", "CKMB", "CKMBINDEX", "TROPONINI" in the last 168 hours. BNP (last 3 results) No results for input(s): "PROBNP" in the last 8760 hours. HbA1C: No results for input(s): "HGBA1C" in the last 72 hours. CBG: Recent Labs  Lab 10/09/22 1934 10/10/22 0032 10/10/22 0405 10/10/22 0749 10/10/22 1128  GLUCAP 119* 89 82 73 112*   Lipid Profile: No results for input(s): "CHOL", "HDL", "LDLCALC", "TRIG", "CHOLHDL", "LDLDIRECT" in the last 72 hours. Thyroid Function Tests: No results for input(s): "TSH", "T4TOTAL", "FREET4", "T3FREE", "THYROIDAB" in the last 72 hours. Anemia Panel: No results for input(s): "VITAMINB12", "FOLATE", "FERRITIN", "TIBC", "IRON", "RETICCTPCT" in the last 72 hours. Sepsis Labs: Recent Labs  Lab 10/05/22 2257  10/06/22 0106 10/07/22 0922 10/07/22 1244  PROCALCITON  --  <0.10  --   --   LATICACIDVEN 1.7 2.3* 1.5 1.2    Recent Results (from the past 240 hour(s))  Resp panel by RT-PCR (RSV, Flu A&B, Covid) Anterior Nasal Swab     Status: None   Collection Time: 10/05/22 10:58 PM   Specimen: Anterior Nasal Swab  Result Value Ref Range Status   SARS Coronavirus 2 by RT PCR NEGATIVE NEGATIVE Final    Comment: (NOTE) SARS-CoV-2 target nucleic acids are NOT DETECTED.  The SARS-CoV-2 RNA is generally detectable in upper respiratory specimens during the acute phase of infection. The lowest concentration of SARS-CoV-2 viral copies this assay can detect is 138 copies/mL. A negative result does not preclude SARS-Cov-2 infection and should not be used as the sole basis for treatment or other patient management decisions. A negative result may occur with  improper specimen collection/handling, submission of specimen other than nasopharyngeal swab, presence of viral mutation(s) within the areas targeted by this assay, and  inadequate number of viral copies(<138 copies/mL). A negative result must be combined with clinical observations, patient history, and epidemiological information. The expected result is Negative.  Fact Sheet for Patients:  BloggerCourse.com  Fact Sheet for Healthcare Providers:  SeriousBroker.it  This test is no t yet approved or cleared by the [place] FDA and  has been authorized for detection and/or diagnosis of SARS-CoV-2 by FDA under an Emergency Use Authorization (EUA). This EUA will remain  in effect (meaning this test can be used) for the duration of the COVID-19 declaration under Section 564(b)(1) of the Act, 21 U.S.C.section 360bbb-3(b)(1), unless the authorization is terminated  or revoked sooner.       Influenza A by PCR NEGATIVE NEGATIVE Final   Influenza B by PCR NEGATIVE NEGATIVE Final    Comment: (NOTE) The Xpert Xpress SARS-CoV-2/FLU/RSV plus assay is intended as an aid in the diagnosis of influenza from Nasopharyngeal swab specimens and should not be used as a sole basis for treatment. Nasal washings and aspirates are unacceptable for Xpert Xpress SARS-CoV-2/FLU/RSV testing.  Fact Sheet for Patients: BloggerCourse.com  Fact Sheet for Healthcare Providers: SeriousBroker.it  This test is not yet approved or cleared by the [place] FDA and has been authorized for detection and/or diagnosis of SARS-CoV-2 by FDA under an Emergency Use Authorization (EUA). This EUA will remain in effect (meaning this test can be used) for the duration of the COVID-19 declaration under Section 564(b)(1) of the Act, 21 U.S.C. section 360bbb-3(b)(1), unless the authorization is terminated or revoked.     Resp Syncytial Virus by PCR NEGATIVE NEGATIVE Final    Comment: (NOTE) Fact Sheet for Patients: BloggerCourse.com  Fact Sheet for Healthcare  Providers: SeriousBroker.it  This test is not yet approved or cleared by the [place] FDA and has been authorized for detection and/or diagnosis of SARS-CoV-2 by FDA under an Emergency Use Authorization (EUA). This EUA will remain in effect (meaning this test can be used) for the duration of the COVID-19 declaration under Section 564(b)(1) of the Act, 21 U.S.C. section 360bbb-3(b)(1), unless the authorization is terminated or revoked.  Performed at [facility], [address] [place], [place] [address]   Blood culture (routine single)     Status: None   Collection Time: 10/05/22 11:26 PM   Specimen: BLOOD  Result Value Ref Range Status   Specimen Description BLOOD BLOOD RIGHT ARM anterior antecubital  Final   Special Requests   Final  BOTTLES DRAWN AEROBIC AND ANAEROBIC Blood Culture adequate volume   Culture   Final    NO GROWTH 5 DAYS Performed at [facility], [address] [place], [place] [address]    Report Status 10/10/2022 FINAL  Final  Culture, blood (Routine X 2) w Reflex to ID Panel     Status: None (Preliminary result)   Collection Time: 10/06/22  1:06 AM   Specimen: BLOOD  Result Value Ref Range Status   Specimen Description BLOOD LEFT UPPER ARM  Final   Special Requests   Final    BOTTLES DRAWN AEROBIC AND ANAEROBIC Blood Culture results may not be optimal due to an inadequate volume of blood received in culture bottles   Culture   Final    NO GROWTH 4 DAYS Performed at [facility], [address] [place], [place] [address]    Report Status PENDING  Incomplete  MRSA Next Gen by PCR, Nasal     Status: None   Collection Time: 10/06/22  4:24 AM   Specimen: Nasal Mucosa; Nasal Swab  Result Value Ref Range Status   MRSA by PCR Next Gen NOT DETECTED NOT DETECTED Final    Comment: (NOTE) The GeneXpert MRSA Assay (FDA approved for NASAL specimens only), is one component of a comprehensive MRSA  colonization surveillance program. It is not intended to diagnose MRSA infection nor to guide or monitor treatment for MRSA infections. Test performance is not FDA approved in patients less than 52 years old. Performed at [facility], [address] [place], [place] [address]   Respiratory (~20 pathogens) panel by PCR     Status: None   Collection Time: 10/06/22 10:39 AM   Specimen: Nasopharyngeal Swab; Respiratory  Result Value Ref Range Status   Adenovirus NOT DETECTED NOT DETECTED Final   Coronavirus 229E NOT DETECTED NOT DETECTED Final    Comment: (NOTE) The Coronavirus on the Respiratory Panel, DOES NOT test for the novel  Coronavirus (2019 nCoV)    Coronavirus HKU1 NOT DETECTED NOT DETECTED Final   Coronavirus NL63 NOT DETECTED NOT DETECTED Final   Coronavirus OC43 NOT DETECTED NOT DETECTED Final   Metapneumovirus NOT DETECTED NOT DETECTED Final   Rhinovirus / Enterovirus NOT DETECTED NOT DETECTED Final   Influenza A NOT DETECTED NOT DETECTED Final   Influenza B NOT DETECTED NOT DETECTED Final   Parainfluenza Virus 1 NOT DETECTED NOT DETECTED Final   Parainfluenza Virus 2 NOT DETECTED NOT DETECTED Final   Parainfluenza Virus 3 NOT DETECTED NOT DETECTED Final   Parainfluenza Virus 4 NOT DETECTED NOT DETECTED Final   Respiratory Syncytial Virus NOT DETECTED NOT DETECTED Final   Bordetella pertussis NOT DETECTED NOT DETECTED Final   Bordetella Parapertussis NOT DETECTED NOT DETECTED Final   Chlamydophila pneumoniae NOT DETECTED NOT DETECTED Final   Mycoplasma pneumoniae NOT DETECTED NOT DETECTED Final    Comment: Performed at [facility], [address] [place], [place] [address]  Urine Culture     Status: Abnormal   Collection Time: 10/06/22  1:21 PM   Specimen: Urine, Catheterized  Result Value Ref Range Status   Specimen Description   Final    URINE, CATHETERIZED Performed at [facility], [address] [place], [place] [address]    Special  Requests   Final    NONE Reflexed from [id number] Performed at [facility], [address] [place], [place] [address]    Culture >=100,000 COLONIES/mL ESCHERICHIA COLI (A)  Final  Report Status 10/09/2022 FINAL  Final   Organism ID, Bacteria ESCHERICHIA COLI (A)  Final      Susceptibility   Escherichia coli - MIC*    AMPICILLIN >=32 RESISTANT Resistant     CEFAZOLIN <=4 SENSITIVE Sensitive     CEFEPIME <=0.12 SENSITIVE Sensitive     CEFTRIAXONE <=0.25 SENSITIVE Sensitive     CIPROFLOXACIN <=0.25 SENSITIVE Sensitive     GENTAMICIN <=1 SENSITIVE Sensitive     IMIPENEM <=0.25 SENSITIVE Sensitive     NITROFURANTOIN <=16 SENSITIVE Sensitive     TRIMETH/SULFA <=20 SENSITIVE Sensitive     AMPICILLIN/SULBACTAM 16 INTERMEDIATE Intermediate     PIP/TAZO <=4 SENSITIVE Sensitive     * >=100,000 COLONIES/mL ESCHERICHIA COLI         Radiology Studies: No results found.      Scheduled Meds:  apixaban  2.5 mg Oral BID   cefUROXime  250 mg Oral BID WC   Chlorhexidine Gluconate Cloth  6 each Topical Daily   feeding supplement  237 mL Oral TID BM   furosemide  40 mg Oral BID   gabapentin  300 mg Oral QHS   [START ON 10/11/2022] levothyroxine  75 mcg Oral QAC breakfast   multivitamin with minerals  1 tablet Oral Daily   mouth rinse  15 mL Mouth Rinse 4 times per day   pravastatin  40 mg Oral q1800   sodium chloride flush  10-40 mL Intracatheter Q12H   spironolactone  12.5 mg Oral Daily   Continuous Infusions:  sodium chloride Stopped (10/07/22 1056)   potassium PHOSPHATE IVPB (in mmol) 30 mmol (10/10/22 0807)     LOS: 4 days      Tresa Moore, MD [facility]   If 7PM-7AM, please contact night-coverage  10/10/2022, 1:30 PM

## 2022-10-10 NOTE — Care Management Important Message (Signed)
Important Message  Patient Details  Name: Nichole Hess MRN: [id number] Date of Birth: Apr 09, 1933   Important Message Given:  Yes - Medicare IM     Johnell Comings 10/10/2022, 2:06 PM

## 2022-10-10 NOTE — TOC Initial Note (Signed)
Transition of Care [facility]) - Initial/Assessment Note    Patient Details  Name: Nichole Hess MRN: [id number] Date of Birth: 10-14-1933  Transition of [facility]) CM/SW Contact:    Chapman Fitch, RN Phone Number: 10/10/2022, 1:45 PM  Clinical Narrative:                  Admitted for: respiratory failure Admitted from: home with daughter PCP: Rolan Lipa Current home health/prior home health/DME: [facility], patient thinks she may have a walker  Met with patient at bedside to discuss disposition.  Patient states that her daughter lives with her.  She states that she is agreeable to home health services, and does not have a preference of agency, but would like me to discuss with her daughter first  Voicemail left for daughter to discuss home health, and confirm if patient has a walker   Patient currently on acute o2 Expected Discharge Plan: Home w Home Health Services     Patient Goals and CMS Choice            Expected Discharge Plan and Services                                              Prior Living Arrangements/Services                       Activities of Daily Living      Permission Sought/Granted                  Emotional Assessment              Admission diagnosis:  Acute pulmonary edema (HCC) [J81.0] Sinus bradycardia [R00.1] Elevated troponin level [R79.89] Acute respiratory failure with hypoxia (HCC) [J96.01] Acute renal failure, unspecified acute renal failure type (HCC) [N17.9] Acute on chronic respiratory failure with hypoxemia (HCC) [J96.21] Acute on chronic congestive heart failure, unspecified heart failure type (HCC) [I50.9] Patient Active Problem List   Diagnosis Date Noted   Malnutrition of moderate degree 10/07/2022   Goals of care, counseling/discussion 10/07/2022   Acute on chronic respiratory failure with hypoxemia (HCC) 10/06/2022   Acute pulmonary edema (HCC) 08/27/2022   Dilated cardiomyopathy (HCC)  08/27/2022   Nonrheumatic mitral valve regurgitation 08/27/2022   Nonrheumatic tricuspid valve regurgitation 08/27/2022   Acute HFrEF (heart failure with reduced ejection fraction) (HCC) 08/27/2022   CKD (chronic kidney disease) stage 3, GFR 30-59 ml/min (HCC) 08/26/2022   SOB (shortness of breath) 08/26/2022   Acute respiratory failure with hypoxia (HCC) 08/26/2022   Atrial fibrillation with RVR (HCC) 08/26/2022   Hypertension    Prediabetes 12/15/2021   Hypertension, essential, benign 06/30/2013   Hypothyroidism 06/30/2013   PCP:  Luciana Axe, NP Pharmacy:   [facility] [phone] Dan Humphreys, [place] - [address] [place] [address] Phone: [phone] Fax: [phone]     Social Determinants of Health (SDOH) Social History: SDOH Screenings   Food Insecurity: No Food Insecurity (08/27/2022)  Housing: Low Risk  (08/27/2022)  Transportation Needs: No Transportation Needs (08/27/2022)  Utilities: Not At Risk (08/27/2022)  Financial Resource Strain: Low Risk  (07/30/2022)   Received from [facility]  Tobacco Use: Medium Risk (10/05/2022)   SDOH Interventions:     Readmission Risk Interventions     No data to display

## 2022-10-10 NOTE — Progress Notes (Addendum)
Patient ID: Nichole Hess, female   DOB: 1933-07-05, 89 y.o.   MRN: [id number]     Advanced Heart Failure Rounding Note  PCP-Cardiologist: Julien Nordmann, MD   Subjective:    Extubated 10/1. Dobutamine stopped 10/2.  HR in 50s today, sinus rhythm.  Good diuresis yesterday, weight is down.   Walked to bathroom yesterday, felt weak.   Creatinine 2.8 => 2.37=>1.77=>1.4.     Objective:   Weight Range: 67 kg Body mass index is 26.16 kg/m.   Vital Signs:   Temp:  [97.7 F (36.5 C)-97.9 F (36.6 C)] 97.9 F (36.6 C) (10/04 0750) Pulse Rate:  [52-109] 54 (10/04 0750) Resp:  [16-18] 18 (10/04 0750) BP: (130-156)/(61-73) 139/62 (10/04 0750) SpO2:  [93 %-98 %] 93 % (10/04 0750) FiO2 (%):  [21 %] 21 % (10/03 2145) Weight:  [67 kg] 67 kg (10/04 0500) Last BM Date : 10/09/22  Weight change: Filed Weights   10/08/22 0500 10/09/22 0500 10/10/22 0500  Weight: 69.1 kg 68.8 kg 67 kg    Intake/Output:   Intake/Output Summary (Last 24 hours) at 10/10/2022 0802 Last data filed at 10/10/2022 0417 Gross per 24 hour  Intake 780.37 ml  Output 3000 ml  Net -2219.63 ml     Physical Exam  General: NAD Neck: JVP 8 cm, no thyromegaly or thyroid nodule.  Lungs: Clear to auscultation bilaterally with normal respiratory effort. CV: Nondisplaced PMI.  Heart regular S1/S2, no S3/S4, 1/6 SEM RUSB.  No peripheral edema.    Abdomen: Soft, nontender, no hepatosplenomegaly, no distention.  Skin: Intact without lesions or rashes.  Neurologic: Alert and oriented x 3.  Psych: Normal affect. Extremities: No clubbing or cyanosis.  HEENT: Normal.    Telemetry   SB 50s (personally reviewed)  Labs    CBC Recent Labs    10/09/22 0613 10/10/22 0451  WBC 10.7* 11.0*  HGB 11.6* 12.9  HCT 34.1* 39.2  MCV 90.0 93.1  PLT 245 271   Basic Metabolic Panel Recent Labs    34/74/25 0613 10/10/22 0451  NA 135 138  K 3.4* 3.7  CL 97* 95*  CO2 28 32  GLUCOSE 92 89  BUN 40* 36*  CREATININE  1.77* 1.41*  CALCIUM 8.6* 9.3  MG 2.1 2.2  PHOS 3.6 1.9*   Liver Function Tests Recent Labs    10/09/22 0613 10/10/22 0451  AST 60*  --   ALT 107*  --   ALKPHOS 44  --   BILITOT 0.8  --   PROT 6.0*  --   ALBUMIN 2.9*  2.9* 3.3*   No results for input(s): "LIPASE", "AMYLASE" in the last 72 hours. Cardiac Enzymes No results for input(s): "CKTOTAL", "CKMB", "CKMBINDEX", "TROPONINI" in the last 72 hours.  BNP: BNP (last 3 results) Recent Labs    08/26/22 1817 09/23/22 1339 10/05/22 2257  BNP 618.6* 1,066.5* 1,298.0*    ProBNP (last 3 results) No results for input(s): "PROBNP" in the last 8760 hours.   D-Dimer No results for input(s): "DDIMER" in the last 72 hours.  Hemoglobin A1C No results for input(s): "HGBA1C" in the last 72 hours. Fasting Lipid Panel Recent Labs    10/07/22 0828  CHOL 115  HDL 42  LDLCALC 63  TRIG 51  CHOLHDL 2.7   Thyroid Function Tests Recent Labs    10/07/22 0922  T3FREE 0.9*    Other results:   Imaging    No results found.   Medications:     Scheduled Medications:  albuterol  2.5 mg Nebulization BID   apixaban  2.5 mg Oral BID   Chlorhexidine Gluconate Cloth  6 each Topical Daily   feeding supplement  237 mL Oral TID BM   furosemide  40 mg Oral BID   multivitamin with minerals  1 tablet Oral Daily   mouth rinse  15 mL Mouth Rinse 4 times per day   sodium chloride flush  10-40 mL Intracatheter Q12H   spironolactone  12.5 mg Oral Daily    Infusions:  sodium chloride Stopped (10/07/22 1056)   cefTRIAXone (ROCEPHIN)  IV Stopped (10/09/22 0848)   potassium PHOSPHATE IVPB (in mmol)      PRN Medications: albuterol, atropine, docusate sodium, mouth rinse, mouth rinse, polyethylene glycol, sodium chloride flush   Assessment/Plan   1. Atrial fibrillation: She was in sinus bradycardia at admission, HR in 30s-40s initially, now in 50s.  Now off DBA.   - Continue Eliquis 2.5 mg BID. Creatinine now < 1.5 so she  technically qualifies for 5 mg bid, but would like to see creatinine stability before increasing (will follow as outpatient).  - No nodal blockade for now given significant bradycardia at admission. There is risk of her going back into atrial fibrillation, but think safest to keep off amiodarone for now.  2. Acute on chronic HFmREF: Echo this admission showed EF 50%, mild LVH, normal RV, mild AI, mild-moderate MR, mild-moderate TR.  She was admitted with volume overload/pulmonary edema, BNP 1298 and AKI. This morning, creatinine 2.8 => 2.37=>1.77=>1.4.  She had lactate up to 2.3 in setting of hypotension/bradycardia/hypoxemia, cleared on dobutamine and NE.  Now off DBA and NE. HR 50s. Good diuresis yesterday, weight down.  She does not look significantly volume overloaded.  - On 1 L [place], try to wean off oxygen.  - Start Lasix 40 mg po bid. Replace K.  - Hold off on SGLT2 inhibitor until followup given UTI.  - Shock resolved, can stop steroids.  - Start spironolactone 12.5 daily.  3. AKI on CKD stage 3: Baseline creatinine looks to be around 1.3.  Up to 2.8 in setting of hypotension/bradycardia.  Suspect ATN.  Hopefully this will improve with time and maintenance of adequate BP => creatinine down to 1.4 today.   4. Abnormal thyroid panel: TSH mildly elevated at 11 (NOT 200!), free T4 also elevated with low free T3.  Suspect sick euthyroid.  Recheck as outpatient.  5. Acute hypoxemic respiratory failure: In setting of pulmonary edema.  With PCT <0.1, doubt PNA. Resolved.  6. Valvular heart disease: Echo this admission less impressive with mild-moderate MR and mild-moderate TR.  7. UTI: E coli UTI, treated with ceftriaxone.  8. HTN: Adding spironolactone 12.5 today.   From my standpoint, she could go home if she titrates off oxygen and can successfully walk in hall.  Cardiac meds for discharge: apixaban 2.5 bid, Lasix 40 bid, KCl 20 daily, spironolactone 12.5 daily, pravastatin 40 daily. Will arrange CHF  clinic followup.   Length of Stay: 4  Marca Ancona, MD  10/10/2022, 8:02 AM  Advanced Heart Failure Team Pager [phone] (M-F; 7a - 5p)  Please contact [facility] for night-coverage after hours (5p -7a ) and weekends on [email or web address]

## 2022-10-11 DIAGNOSIS — J9621 Acute and chronic respiratory failure with hypoxia: Secondary | ICD-10-CM | POA: Diagnosis not present

## 2022-10-11 LAB — CBC
HCT: 42.8 % (ref 36.0–46.0)
Hemoglobin: 14.1 g/dL (ref 12.0–15.0)
MCH: 30.5 pg (ref 26.0–34.0)
MCHC: 32.9 g/dL (ref 30.0–36.0)
MCV: 92.6 fL (ref 80.0–100.0)
Platelets: 276 10*3/uL (ref 150–400)
RBC: 4.62 MIL/uL (ref 3.87–5.11)
RDW: 14.9 % (ref 11.5–15.5)
WBC: 11.5 10*3/uL — ABNORMAL HIGH (ref 4.0–10.5)
nRBC: 0 % (ref 0.0–0.2)

## 2022-10-11 LAB — GLUCOSE, CAPILLARY
Glucose-Capillary: 88 mg/dL (ref 70–99)
Glucose-Capillary: 103 mg/dL — ABNORMAL HIGH (ref 70–99)
Glucose-Capillary: 109 mg/dL — ABNORMAL HIGH (ref 70–99)

## 2022-10-11 LAB — RENAL FUNCTION PANEL
Albumin: 3.3 g/dL — ABNORMAL LOW (ref 3.5–5.0)
Anion gap: 11 (ref 5–15)
BUN: 37 mg/dL — ABNORMAL HIGH (ref 8–23)
CO2: 35 mmol/L — ABNORMAL HIGH (ref 22–32)
Calcium: 9.6 mg/dL (ref 8.9–10.3)
Chloride: 92 mmol/L — ABNORMAL LOW (ref 98–111)
Creatinine, Ser: 1.31 mg/dL — ABNORMAL HIGH (ref 0.44–1.00)
GFR, Estimated: 39 mL/min — ABNORMAL LOW (ref >60)
Glucose, Bld: 87 mg/dL (ref 70–99)
Phosphorus: 2.9 mg/dL (ref 2.5–4.6)
Potassium: 4.3 mmol/L (ref 3.5–5.1)
Sodium: 138 mmol/L (ref 135–145)

## 2022-10-11 LAB — CULTURE, BLOOD (ROUTINE X 2): Culture: NO GROWTH

## 2022-10-11 MED ORDER — APIXABAN 2.5 MG PO TABS: 2.5000 mg | ORAL_TABLET | Freq: Two times a day (BID) | ORAL | 0 refills | Status: DC

## 2022-10-11 MED ORDER — FUROSEMIDE 20 MG PO TABS: 40.0000 mg | ORAL_TABLET | Freq: Two times a day (BID) | ORAL | 1 refills | Status: DC

## 2022-10-11 MED ORDER — POTASSIUM CHLORIDE ER 20 MEQ PO TBCR: 20.0000 meq | EXTENDED_RELEASE_TABLET | Freq: Every day | ORAL | 1 refills | Status: DC

## 2022-10-11 MED ORDER — SPIRONOLACTONE 25 MG PO TABS: 12.5000 mg | ORAL_TABLET | Freq: Every day | ORAL | 0 refills | Status: DC

## 2022-10-11 NOTE — TOC Transition Note (Signed)
Transition of Care [facility]) - CM/SW Discharge Note   Patient Details  Name: Nichole Hess MRN: [id number] Date of Birth: 07/25/1933  [facility]) CM/SW Contact:  Liliana Cline, LCSW Phone Number: 10/11/2022, 9:43 AM   Clinical Narrative:    Patient to DC home today. Called patient's daughter Cordelia Pen. Cordelia Pen states she is at work but can pick patient up after 1pm.  Cordelia Pen is agreeable to [facility], declines agency preferences, confirmed home address, referral accepted by Elnita Maxwell with [facility].  Cordelia Pen is agreeable to RW, declines agency preferences, referral made to [facility] with [facility] for DC today.   Final next level of care: Home w Home Health Services Barriers to Discharge: Barriers Resolved   Patient Goals and CMS Choice CMS Medicare.gov Compare Post Acute Care list provided to:: Patient Represenative (must comment) Choice offered to / list presented to : Patient, Adult Children  Discharge Placement                    Name of family member notified: Cordelia Pen - daughter Patient and family notified of of transfer: 10/11/22  Discharge Plan and Services Additional resources added to the After Visit Summary for                  DME Arranged: Walker rolling DME Agency: [employer] Date DME Agency Contacted: 10/11/22   Representative spoke with at DME Agency: Selena Batten HH Arranged: PT, RN [facility] Agency: [facility] Date [facility] Agency Contacted: 10/11/22   Representative spoke with at [facility] Agency: Elnita Maxwell  Social Determinants of Health (SDOH) Interventions SDOH Screenings   Food Insecurity: No Food Insecurity (08/27/2022)  Housing: Low Risk  (08/27/2022)  Transportation Needs: No Transportation Needs (08/27/2022)  Utilities: Not At Risk (08/27/2022)  Financial Resource Strain: Low Risk  (07/30/2022)   Received from [facility]  Tobacco Use: Medium Risk (10/05/2022)     Readmission Risk Interventions     No data to display

## 2022-10-11 NOTE — Discharge Summary (Signed)
Physician Discharge Summary  Nichole Hess ZOX:[id number] DOB: October 06, 1933 DOA: 10/05/2022  PCP: Luciana Axe, NP  Admit date: 10/05/2022 Discharge date: 10/11/2022  Admitted From: Home Disposition:  Home w home health  Recommendations for Outpatient Follow-up:  Follow up with PCP in 1-2 weeks Outpatient follow-up with cardiology (cardiology to arrange)  Home Health: Yes PT RN Equipment/Devices: Yes RW walker  Discharge Condition: Stable CODE STATUS: Full Diet recommendation: Cardiac  Brief/Interim Summary:  89 y.o female with significant PMH of HTN, Ao atherosclerosis, hypothyroidism, and CKD III, HFmrEF, Atrial Fibrillation on Eliquis, and HLD who presented to the ED with chief complaints of progressive shortness of breath.   On review of the chart, patient was hospitalized at [facility] on 8/20 to 8/22 for new onset atrial fibrillation with RVR and acute HFmrEF in the setting of A-fib with RVR.  She was started on beta-blocker, Eliquis and discharged on Lasix.  Since discharge she followed with her cardiologist on 09/12/2022 who continued her metoprolol and Lasix but held Coreg, losartan and amlodipine due to hypotension.  Patient returned to the ED on 9/17 with shortness of breath and weight gain.  She was again diuresed and discharged home to follow-up with her cardiologist.  Patient saw her cardiologist again on 09/26/2022 who recommended starting amiodarone for rate control and possible cardioversion if she remained in A-fib.  She was also continued Eliquis 5 mg twice daily and her Lasix increased to 240 mg daily an additional 40 in the afternoon for shortness of breath or abdominal swelling.  Patient's developed worsening shortness of breath yesterday afternoon with associated orthopnea and increased work of breathing which prompted her to come to the ED. Denies fevers, chills, n/v or chest pain.   10/07/22- patient failed weaning extubation trial with apnea.  We will perform CPAP trials  today for chest physiotherapy.  Patient remains on Dobutamine drip. 10/08/22- patient s/p liberation from mechanical ventilation.  Off BIPAP, for SLP today.  Weaning off dobutamine , will remove PICC.  Narrowing abx to PO post swallow eval.  PT/OT.  10/09/22-  patient stable overnight, Optimizing for TRH transfer 10/10/22-care assumed by [facility] hospitalist service.  No acute events overnight.  Patient doing relatively well this morning. 10/5: Patient stable for discharge home.  Home health PT and RN ordered.  Cardiac medications adjusted and reflected in discharge medication reconciliation.  Stable for discharge.  Follow-up outpatient PCP and cardiology.  Cardiology to arrange heart failure clinic follow-up.      Discharge Diagnoses:  Principal Problem:   Acute on chronic respiratory failure with hypoxemia (HCC) Active Problems:   Malnutrition of moderate degree   Goals of care, counseling/discussion Atrial fibrillation with slow ventricular response Bradycardic on admission.  Heart rate 30s to 40s.  Now up to 50s. Plan: Continue Eliquis reduced dose 2.5 twice daily Hold all AV nodal blockers on discharge.  Cardiology to address and office visit   Acute on chronic heart failure with mildly reduced ejection fraction EF 50%, normal RV Diuresing well Not overtly fluid overloaded Plan: Lasix p.o. 40 mg twice daily Spironolactone 12.5 mg daily K-Dur 20 milliequivalents daily   AKI on CKD stage IIIa Creatinine at or near baseline Monitor while on diuretic   Acute hypoxemic respiratory failure Room air at time of discharge   UTI Status post 3 doses of ceftriaxone   Essential hypertension Avoid AV nodal blocking agents Lasix as above Aldactone as above   Hyperlipidemia Pravastatin   Discharge Instructions  Discharge Instructions  Diet - low sodium heart healthy   Complete by: As directed    Increase activity slowly   Complete by: As directed       Allergies as of  10/11/2022   No Known Allergies      Medication List     STOP taking these medications    amiodarone 200 MG tablet Commonly known as: PACERONE   amiodarone 400 MG tablet Commonly known as: PACERONE   metoprolol tartrate 25 MG tablet Commonly known as: LOPRESSOR       TAKE these medications    apixaban 2.5 MG Tabs tablet Commonly known as: ELIQUIS Take 1 tablet (2.5 mg total) by mouth 2 (two) times daily. What changed:  medication strength how much to take   fluticasone 50 MCG/ACT nasal spray Commonly known as: FLONASE Place 2 sprays into both nostrils daily.   furosemide 20 MG tablet Commonly known as: LASIX Take 2 tablets (40 mg total) by mouth 2 (two) times daily. May take an additional 40 mg ( 1 tablet) daily as needed for shortness of breath or abdominal swelling. What changed: when to take this   gabapentin 300 MG capsule Commonly known as: NEURONTIN Take 300 mg by mouth at bedtime.   levothyroxine 75 MCG tablet Commonly known as: SYNTHROID Take 75 mcg by mouth daily before breakfast.   Multi-Vitamins Tabs Take 1 tablet by mouth daily.   Potassium Chloride ER 20 MEQ Tbcr Take 1 tablet (20 mEq total) by mouth daily. May take an additional dose 10 Meq ( 1 tablet) as needed with extra lasix. What changed:  medication strength how much to take   pravastatin 40 MG tablet Commonly known as: PRAVACHOL Take 40 mg by mouth at bedtime.   spironolactone 25 MG tablet Commonly known as: ALDACTONE Take 0.5 tablets (12.5 mg total) by mouth daily.               Durable Medical Equipment  (From admission, onward)           Start     Ordered   10/11/22 1015  For home use only DME Walker rolling  Once       Question Answer Comment  Walker: With 5 Inch Wheels   Patient needs a walker to treat with the following condition Weakness      10/11/22 1014            No Known  Allergies  Consultations: Cardiology   Procedures/Studies: ECHOCARDIOGRAM LIMITED  Result Date: 10/07/2022    ECHOCARDIOGRAM LIMITED REPORT   Patient Name:   Nichole Hess Date of Exam: 10/06/2022 Medical Rec #:  [id number]      Height:       63.0 in Accession #:    [id number]     Weight:       161.8 lb Date of Birth:  [birth year]      BSA:          1.767 m Patient Age:    89 years       BP:           119/48 mmHg Patient Gender: F              HR:           62 bpm. Exam Location:  [facility] Procedure: 2D Echo, Limited Echo and Limited Color Doppler Indications:     I50.31 Acute Diastolic CHF  History:         Patient has prior history of  Physician Discharge Summary  Nichole Hess ZOX:[id number] DOB: October 06, 1933 DOA: 10/05/2022  PCP: Luciana Axe, NP  Admit date: 10/05/2022 Discharge date: 10/11/2022  Admitted From: Home Disposition:  Home w home health  Recommendations for Outpatient Follow-up:  Follow up with PCP in 1-2 weeks Outpatient follow-up with cardiology (cardiology to arrange)  Home Health: Yes PT RN Equipment/Devices: Yes RW walker  Discharge Condition: Stable CODE STATUS: Full Diet recommendation: Cardiac  Brief/Interim Summary:  89 y.o female with significant PMH of HTN, Ao atherosclerosis, hypothyroidism, and CKD III, HFmrEF, Atrial Fibrillation on Eliquis, and HLD who presented to the ED with chief complaints of progressive shortness of breath.   On review of the chart, patient was hospitalized at [facility] on 8/20 to 8/22 for new onset atrial fibrillation with RVR and acute HFmrEF in the setting of A-fib with RVR.  She was started on beta-blocker, Eliquis and discharged on Lasix.  Since discharge she followed with her cardiologist on 09/12/2022 who continued her metoprolol and Lasix but held Coreg, losartan and amlodipine due to hypotension.  Patient returned to the ED on 9/17 with shortness of breath and weight gain.  She was again diuresed and discharged home to follow-up with her cardiologist.  Patient saw her cardiologist again on 09/26/2022 who recommended starting amiodarone for rate control and possible cardioversion if she remained in A-fib.  She was also continued Eliquis 5 mg twice daily and her Lasix increased to 240 mg daily an additional 40 in the afternoon for shortness of breath or abdominal swelling.  Patient's developed worsening shortness of breath yesterday afternoon with associated orthopnea and increased work of breathing which prompted her to come to the ED. Denies fevers, chills, n/v or chest pain.   10/07/22- patient failed weaning extubation trial with apnea.  We will perform CPAP trials  today for chest physiotherapy.  Patient remains on Dobutamine drip. 10/08/22- patient s/p liberation from mechanical ventilation.  Off BIPAP, for SLP today.  Weaning off dobutamine , will remove PICC.  Narrowing abx to PO post swallow eval.  PT/OT.  10/09/22-  patient stable overnight, Optimizing for TRH transfer 10/10/22-care assumed by [facility] hospitalist service.  No acute events overnight.  Patient doing relatively well this morning. 10/5: Patient stable for discharge home.  Home health PT and RN ordered.  Cardiac medications adjusted and reflected in discharge medication reconciliation.  Stable for discharge.  Follow-up outpatient PCP and cardiology.  Cardiology to arrange heart failure clinic follow-up.      Discharge Diagnoses:  Principal Problem:   Acute on chronic respiratory failure with hypoxemia (HCC) Active Problems:   Malnutrition of moderate degree   Goals of care, counseling/discussion Atrial fibrillation with slow ventricular response Bradycardic on admission.  Heart rate 30s to 40s.  Now up to 50s. Plan: Continue Eliquis reduced dose 2.5 twice daily Hold all AV nodal blockers on discharge.  Cardiology to address and office visit   Acute on chronic heart failure with mildly reduced ejection fraction EF 50%, normal RV Diuresing well Not overtly fluid overloaded Plan: Lasix p.o. 40 mg twice daily Spironolactone 12.5 mg daily K-Dur 20 milliequivalents daily   AKI on CKD stage IIIa Creatinine at or near baseline Monitor while on diuretic   Acute hypoxemic respiratory failure Room air at time of discharge   UTI Status post 3 doses of ceftriaxone   Essential hypertension Avoid AV nodal blocking agents Lasix as above Aldactone as above   Hyperlipidemia Pravastatin   Discharge Instructions  Discharge Instructions  Diet - low sodium heart healthy   Complete by: As directed    Increase activity slowly   Complete by: As directed       Allergies as of  10/11/2022   No Known Allergies      Medication List     STOP taking these medications    amiodarone 200 MG tablet Commonly known as: PACERONE   amiodarone 400 MG tablet Commonly known as: PACERONE   metoprolol tartrate 25 MG tablet Commonly known as: LOPRESSOR       TAKE these medications    apixaban 2.5 MG Tabs tablet Commonly known as: ELIQUIS Take 1 tablet (2.5 mg total) by mouth 2 (two) times daily. What changed:  medication strength how much to take   fluticasone 50 MCG/ACT nasal spray Commonly known as: FLONASE Place 2 sprays into both nostrils daily.   furosemide 20 MG tablet Commonly known as: LASIX Take 2 tablets (40 mg total) by mouth 2 (two) times daily. May take an additional 40 mg ( 1 tablet) daily as needed for shortness of breath or abdominal swelling. What changed: when to take this   gabapentin 300 MG capsule Commonly known as: NEURONTIN Take 300 mg by mouth at bedtime.   levothyroxine 75 MCG tablet Commonly known as: SYNTHROID Take 75 mcg by mouth daily before breakfast.   Multi-Vitamins Tabs Take 1 tablet by mouth daily.   Potassium Chloride ER 20 MEQ Tbcr Take 1 tablet (20 mEq total) by mouth daily. May take an additional dose 10 Meq ( 1 tablet) as needed with extra lasix. What changed:  medication strength how much to take   pravastatin 40 MG tablet Commonly known as: PRAVACHOL Take 40 mg by mouth at bedtime.   spironolactone 25 MG tablet Commonly known as: ALDACTONE Take 0.5 tablets (12.5 mg total) by mouth daily.               Durable Medical Equipment  (From admission, onward)           Start     Ordered   10/11/22 1015  For home use only DME Walker rolling  Once       Question Answer Comment  Walker: With 5 Inch Wheels   Patient needs a walker to treat with the following condition Weakness      10/11/22 1014            No Known  Allergies  Consultations: Cardiology   Procedures/Studies: ECHOCARDIOGRAM LIMITED  Result Date: 10/07/2022    ECHOCARDIOGRAM LIMITED REPORT   Patient Name:   Nichole Hess Date of Exam: 10/06/2022 Medical Rec #:  [id number]      Height:       63.0 in Accession #:    [id number]     Weight:       161.8 lb Date of Birth:  [birth year]      BSA:          1.767 m Patient Age:    89 years       BP:           119/48 mmHg Patient Gender: F              HR:           62 bpm. Exam Location:  [facility] Procedure: 2D Echo, Limited Echo and Limited Color Doppler Indications:     I50.31 Acute Diastolic CHF  History:         Patient has prior history of  Physician Discharge Summary  Nichole Hess ZOX:[id number] DOB: October 06, 1933 DOA: 10/05/2022  PCP: Luciana Axe, NP  Admit date: 10/05/2022 Discharge date: 10/11/2022  Admitted From: Home Disposition:  Home w home health  Recommendations for Outpatient Follow-up:  Follow up with PCP in 1-2 weeks Outpatient follow-up with cardiology (cardiology to arrange)  Home Health: Yes PT RN Equipment/Devices: Yes RW walker  Discharge Condition: Stable CODE STATUS: Full Diet recommendation: Cardiac  Brief/Interim Summary:  89 y.o female with significant PMH of HTN, Ao atherosclerosis, hypothyroidism, and CKD III, HFmrEF, Atrial Fibrillation on Eliquis, and HLD who presented to the ED with chief complaints of progressive shortness of breath.   On review of the chart, patient was hospitalized at [facility] on 8/20 to 8/22 for new onset atrial fibrillation with RVR and acute HFmrEF in the setting of A-fib with RVR.  She was started on beta-blocker, Eliquis and discharged on Lasix.  Since discharge she followed with her cardiologist on 09/12/2022 who continued her metoprolol and Lasix but held Coreg, losartan and amlodipine due to hypotension.  Patient returned to the ED on 9/17 with shortness of breath and weight gain.  She was again diuresed and discharged home to follow-up with her cardiologist.  Patient saw her cardiologist again on 09/26/2022 who recommended starting amiodarone for rate control and possible cardioversion if she remained in A-fib.  She was also continued Eliquis 5 mg twice daily and her Lasix increased to 240 mg daily an additional 40 in the afternoon for shortness of breath or abdominal swelling.  Patient's developed worsening shortness of breath yesterday afternoon with associated orthopnea and increased work of breathing which prompted her to come to the ED. Denies fevers, chills, n/v or chest pain.   10/07/22- patient failed weaning extubation trial with apnea.  We will perform CPAP trials  today for chest physiotherapy.  Patient remains on Dobutamine drip. 10/08/22- patient s/p liberation from mechanical ventilation.  Off BIPAP, for SLP today.  Weaning off dobutamine , will remove PICC.  Narrowing abx to PO post swallow eval.  PT/OT.  10/09/22-  patient stable overnight, Optimizing for TRH transfer 10/10/22-care assumed by [facility] hospitalist service.  No acute events overnight.  Patient doing relatively well this morning. 10/5: Patient stable for discharge home.  Home health PT and RN ordered.  Cardiac medications adjusted and reflected in discharge medication reconciliation.  Stable for discharge.  Follow-up outpatient PCP and cardiology.  Cardiology to arrange heart failure clinic follow-up.      Discharge Diagnoses:  Principal Problem:   Acute on chronic respiratory failure with hypoxemia (HCC) Active Problems:   Malnutrition of moderate degree   Goals of care, counseling/discussion Atrial fibrillation with slow ventricular response Bradycardic on admission.  Heart rate 30s to 40s.  Now up to 50s. Plan: Continue Eliquis reduced dose 2.5 twice daily Hold all AV nodal blockers on discharge.  Cardiology to address and office visit   Acute on chronic heart failure with mildly reduced ejection fraction EF 50%, normal RV Diuresing well Not overtly fluid overloaded Plan: Lasix p.o. 40 mg twice daily Spironolactone 12.5 mg daily K-Dur 20 milliequivalents daily   AKI on CKD stage IIIa Creatinine at or near baseline Monitor while on diuretic   Acute hypoxemic respiratory failure Room air at time of discharge   UTI Status post 3 doses of ceftriaxone   Essential hypertension Avoid AV nodal blocking agents Lasix as above Aldactone as above   Hyperlipidemia Pravastatin   Discharge Instructions  Discharge Instructions  Diet - low sodium heart healthy   Complete by: As directed    Increase activity slowly   Complete by: As directed       Allergies as of  10/11/2022   No Known Allergies      Medication List     STOP taking these medications    amiodarone 200 MG tablet Commonly known as: PACERONE   amiodarone 400 MG tablet Commonly known as: PACERONE   metoprolol tartrate 25 MG tablet Commonly known as: LOPRESSOR       TAKE these medications    apixaban 2.5 MG Tabs tablet Commonly known as: ELIQUIS Take 1 tablet (2.5 mg total) by mouth 2 (two) times daily. What changed:  medication strength how much to take   fluticasone 50 MCG/ACT nasal spray Commonly known as: FLONASE Place 2 sprays into both nostrils daily.   furosemide 20 MG tablet Commonly known as: LASIX Take 2 tablets (40 mg total) by mouth 2 (two) times daily. May take an additional 40 mg ( 1 tablet) daily as needed for shortness of breath or abdominal swelling. What changed: when to take this   gabapentin 300 MG capsule Commonly known as: NEURONTIN Take 300 mg by mouth at bedtime.   levothyroxine 75 MCG tablet Commonly known as: SYNTHROID Take 75 mcg by mouth daily before breakfast.   Multi-Vitamins Tabs Take 1 tablet by mouth daily.   Potassium Chloride ER 20 MEQ Tbcr Take 1 tablet (20 mEq total) by mouth daily. May take an additional dose 10 Meq ( 1 tablet) as needed with extra lasix. What changed:  medication strength how much to take   pravastatin 40 MG tablet Commonly known as: PRAVACHOL Take 40 mg by mouth at bedtime.   spironolactone 25 MG tablet Commonly known as: ALDACTONE Take 0.5 tablets (12.5 mg total) by mouth daily.               Durable Medical Equipment  (From admission, onward)           Start     Ordered   10/11/22 1015  For home use only DME Walker rolling  Once       Question Answer Comment  Walker: With 5 Inch Wheels   Patient needs a walker to treat with the following condition Weakness      10/11/22 1014            No Known  Allergies  Consultations: Cardiology   Procedures/Studies: ECHOCARDIOGRAM LIMITED  Result Date: 10/07/2022    ECHOCARDIOGRAM LIMITED REPORT   Patient Name:   Nichole Hess Date of Exam: 10/06/2022 Medical Rec #:  [id number]      Height:       63.0 in Accession #:    [id number]     Weight:       161.8 lb Date of Birth:  [birth year]      BSA:          1.767 m Patient Age:    89 years       BP:           119/48 mmHg Patient Gender: F              HR:           62 bpm. Exam Location:  [facility] Procedure: 2D Echo, Limited Echo and Limited Color Doppler Indications:     I50.31 Acute Diastolic CHF  History:         Patient has prior history of  Physician Discharge Summary  Nichole Hess ZOX:[id number] DOB: October 06, 1933 DOA: 10/05/2022  PCP: Luciana Axe, NP  Admit date: 10/05/2022 Discharge date: 10/11/2022  Admitted From: Home Disposition:  Home w home health  Recommendations for Outpatient Follow-up:  Follow up with PCP in 1-2 weeks Outpatient follow-up with cardiology (cardiology to arrange)  Home Health: Yes PT RN Equipment/Devices: Yes RW walker  Discharge Condition: Stable CODE STATUS: Full Diet recommendation: Cardiac  Brief/Interim Summary:  89 y.o female with significant PMH of HTN, Ao atherosclerosis, hypothyroidism, and CKD III, HFmrEF, Atrial Fibrillation on Eliquis, and HLD who presented to the ED with chief complaints of progressive shortness of breath.   On review of the chart, patient was hospitalized at [facility] on 8/20 to 8/22 for new onset atrial fibrillation with RVR and acute HFmrEF in the setting of A-fib with RVR.  She was started on beta-blocker, Eliquis and discharged on Lasix.  Since discharge she followed with her cardiologist on 09/12/2022 who continued her metoprolol and Lasix but held Coreg, losartan and amlodipine due to hypotension.  Patient returned to the ED on 9/17 with shortness of breath and weight gain.  She was again diuresed and discharged home to follow-up with her cardiologist.  Patient saw her cardiologist again on 09/26/2022 who recommended starting amiodarone for rate control and possible cardioversion if she remained in A-fib.  She was also continued Eliquis 5 mg twice daily and her Lasix increased to 240 mg daily an additional 40 in the afternoon for shortness of breath or abdominal swelling.  Patient's developed worsening shortness of breath yesterday afternoon with associated orthopnea and increased work of breathing which prompted her to come to the ED. Denies fevers, chills, n/v or chest pain.   10/07/22- patient failed weaning extubation trial with apnea.  We will perform CPAP trials  today for chest physiotherapy.  Patient remains on Dobutamine drip. 10/08/22- patient s/p liberation from mechanical ventilation.  Off BIPAP, for SLP today.  Weaning off dobutamine , will remove PICC.  Narrowing abx to PO post swallow eval.  PT/OT.  10/09/22-  patient stable overnight, Optimizing for TR[facility] transfer 10/10/22-care assumed by [facility] hospitalist service.  No acute events overnight.  Patient doing relatively well this morning. 10/5: Patient stable for discharge home.  Home health PT and RN ordered.  Cardiac medications adjusted and reflected in discharge medication reconciliation.  Stable for discharge.  Follow-up outpatient PCP and cardiology.  Cardiology to arrange heart failure clinic follow-up.      Discharge Diagnoses:  Principal Problem:   Acute on chronic respiratory failure with hypoxemia (HCC) Active Problems:   Malnutrition of moderate degree   Goals of care, counseling/discussion Atrial fibrillation with slow ventricular response Bradycardic on admission.  Heart rate 30s to 40s.  Now up to 50s. Plan: Continue Eliquis reduced dose 2.5 twice daily Hold all AV nodal blockers on discharge.  Cardiology to address and office visit   Acute on chronic heart failure with mildly reduced ejection fraction EF 50%, normal RV Diuresing well Not overtly fluid overloaded Plan: Lasix p.o. 40 mg twice daily Spironolactone 12.5 mg daily K-Dur 20 milliequivalents daily   AKI on CKD stage IIIa Creatinine at or near baseline Monitor while on diuretic   Acute hypoxemic respiratory failure Room air at time of discharge   UTI Status post 3 doses of ceftriaxone   Essential hypertension Avoid AV nodal blocking agents Lasix as above Aldactone as above   Hyperlipidemia Pravastatin   Discharge Instructions  Discharge Instructions  Diet - low sodium heart healthy   Complete by: As directed    Increase activity slowly   Complete by: As directed       Allergies as of  10/11/2022   No Known Allergies      Medication List     STOP taking these medications    amiodarone 200 MG tablet Commonly known as: PACERONE   amiodarone 400 MG tablet Commonly known as: PACERONE   metoprolol tartrate 25 MG tablet Commonly known as: LOPRESSOR       TAKE these medications    apixaban 2.5 MG Tabs tablet Commonly known as: ELIQUIS Take 1 tablet (2.5 mg total) by mouth 2 (two) times daily. What changed:  medication strength how much to take   fluticasone 50 MCG/ACT nasal spray Commonly known as: FLONASE Place 2 sprays into both nostrils daily.   furosemide 20 MG tablet Commonly known as: LASIX Take 2 tablets (40 mg total) by mouth 2 (two) times daily. May take an additional 40 mg ( 1 tablet) daily as needed for shortness of breath or abdominal swelling. What changed: when to take this   gabapentin 300 MG capsule Commonly known as: NEURONTIN Take 300 mg by mouth at bedtime.   levothyroxine 75 MCG tablet Commonly known as: SYNTHROID Take 75 mcg by mouth daily before breakfast.   Multi-Vitamins Tabs Take 1 tablet by mouth daily.   Potassium Chloride ER 20 MEQ Tbcr Take 1 tablet (20 mEq total) by mouth daily. May take an additional dose 10 Meq ( 1 tablet) as needed with extra lasix. What changed:  medication strength how much to take   pravastatin 40 MG tablet Commonly known as: PRAVACHOL Take 40 mg by mouth at bedtime.   spironolactone 25 MG tablet Commonly known as: ALDACTONE Take 0.5 tablets (12.5 mg total) by mouth daily.               Durable Medical Equipment  (From admission, onward)           Start     Ordered   10/11/22 1015  For home use only DME Walker rolling  Once       Question Answer Comment  Walker: With 5 Inch Wheels   Patient needs a walker to treat with the following condition Weakness      10/11/22 1014            No Known  Allergies  Consultations: Cardiology   Procedures/Studies: ECHOCARDIOGRAM LIMITED  Result Date: 10/07/2022    ECHOCARDIOGRAM LIMITED REPORT   Patient Name:   Nichole Hess Date of Exam: 10/06/2022 Medical Rec #:  [id number]      Height:       63.0 in Accession #:    [id number]     Weight:       161.8 lb Date of Birth:  [birth year]      BSA:          1.767 m Patient Age:    89 years       BP:           119/48 mmHg Patient Gender: F              HR:           62 bpm. Exam Location:  [facility] Procedure: 2D Echo, Limited Echo and Limited Color Doppler Indications:     I50.31 Acute Diastolic CHF  History:         Patient has prior history of  Physician Discharge Summary  Nichole Hess ZOX:[id number] DOB: October 06, 1933 DOA: 10/05/2022  PCP: Luciana Axe, NP  Admit date: 10/05/2022 Discharge date: 10/11/2022  Admitted From: Home Disposition:  Home w home health  Recommendations for Outpatient Follow-up:  Follow up with PCP in 1-2 weeks Outpatient follow-up with cardiology (cardiology to arrange)  Home Health: Yes PT RN Equipment/Devices: Yes RW walker  Discharge Condition: Stable CODE STATUS: Full Diet recommendation: Cardiac  Brief/Interim Summary:  89 y.o female with significant PMH of HTN, Ao atherosclerosis, hypothyroidism, and CKD III, HFmrEF, Atrial Fibrillation on Eliquis, and HLD who presented to the ED with chief complaints of progressive shortness of breath.   On review of the chart, patient was hospitalized at [facility] on 8/20 to 8/22 for new onset atrial fibrillation with RVR and acute HFmrEF in the setting of A-fib with RVR.  She was started on beta-blocker, Eliquis and discharged on Lasix.  Since discharge she followed with her cardiologist on 09/12/2022 who continued her metoprolol and Lasix but held Coreg, losartan and amlodipine due to hypotension.  Patient returned to the ED on 9/17 with shortness of breath and weight gain.  She was again diuresed and discharged home to follow-up with her cardiologist.  Patient saw her cardiologist again on 09/26/2022 who recommended starting amiodarone for rate control and possible cardioversion if she remained in A-fib.  She was also continued Eliquis 5 mg twice daily and her Lasix increased to 240 mg daily an additional 40 in the afternoon for shortness of breath or abdominal swelling.  Patient's developed worsening shortness of breath yesterday afternoon with associated orthopnea and increased work of breathing which prompted her to come to the ED. Denies fevers, chills, n/v or chest pain.   10/07/22- patient failed weaning extubation trial with apnea.  We will perform CPAP trials  today for chest physiotherapy.  Patient remains on Dobutamine drip. 10/08/22- patient s/p liberation from mechanical ventilation.  Off BIPAP, for SLP today.  Weaning off dobutamine , will remove PICC.  Narrowing abx to PO post swallow eval.  PT/OT.  10/09/22-  patient stable overnight, Optimizing for [facility] transfer 10/10/22-care assumed by [facility] hospitalist service.  No acute events overnight.  Patient doing relatively well this morning. 10/5: Patient stable for discharge home.  Home health PT and RN ordered.  Cardiac medications adjusted and reflected in discharge medication reconciliation.  Stable for discharge.  Follow-up outpatient PCP and cardiology.  Cardiology to arrange heart failure clinic follow-up.      Discharge Diagnoses:  Principal Problem:   Acute on chronic respiratory failure with hypoxemia (HCC) Active Problems:   Malnutrition of moderate degree   Goals of care, counseling/discussion Atrial fibrillation with slow ventricular response Bradycardic on admission.  Heart rate 30s to 40s.  Now up to 50s. Plan: Continue Eliquis reduced dose 2.5 twice daily Hold all AV nodal blockers on discharge.  Cardiology to address and office visit   Acute on chronic heart failure with mildly reduced ejection fraction EF 50%, normal RV Diuresing well Not overtly fluid overloaded Plan: Lasix p.o. 40 mg twice daily Spironolactone 12.5 mg daily K-Dur 20 milliequivalents daily   AKI on CKD stage IIIa Creatinine at or near baseline Monitor while on diuretic   Acute hypoxemic respiratory failure Room air at time of discharge   UTI Status post 3 doses of ceftriaxone   Essential hypertension Avoid AV nodal blocking agents Lasix as above Aldactone as above   Hyperlipidemia Pravastatin   Discharge Instructions  Discharge Instructions  Diet - low sodium heart healthy   Complete by: As directed    Increase activity slowly   Complete by: As directed       Allergies as of  10/11/2022   No Known Allergies      Medication List     STOP taking these medications    amiodarone 200 MG tablet Commonly known as: PACERONE   amiodarone 400 MG tablet Commonly known as: PACERONE   metoprolol tartrate 25 MG tablet Commonly known as: LOPRESSOR       TAKE these medications    apixaban 2.5 MG Tabs tablet Commonly known as: ELIQUIS Take 1 tablet (2.5 mg total) by mouth 2 (two) times daily. What changed:  medication strength how much to take   fluticasone 50 MCG/ACT nasal spray Commonly known as: FLONASE Place 2 sprays into both nostrils daily.   furosemide 20 MG tablet Commonly known as: LASIX Take 2 tablets (40 mg total) by mouth 2 (two) times daily. May take an additional 40 mg ( 1 tablet) daily as needed for shortness of breath or abdominal swelling. What changed: when to take this   gabapentin 300 MG capsule Commonly known as: NEURONTIN Take 300 mg by mouth at bedtime.   levothyroxine 75 MCG tablet Commonly known as: SYNTHROID Take 75 mcg by mouth daily before breakfast.   Multi-Vitamins Tabs Take 1 tablet by mouth daily.   Potassium Chloride ER 20 MEQ Tbcr Take 1 tablet (20 mEq total) by mouth daily. May take an additional dose 10 Meq ( 1 tablet) as needed with extra lasix. What changed:  medication strength how much to take   pravastatin 40 MG tablet Commonly known as: PRAVACHOL Take 40 mg by mouth at bedtime.   spironolactone 25 MG tablet Commonly known as: ALDACTONE Take 0.5 tablets (12.5 mg total) by mouth daily.               Durable Medical Equipment  (From admission, onward)           Start     Ordered   10/11/22 1015  For home use only DME Walker rolling  Once       Question Answer Comment  Walker: With 5 Inch Wheels   Patient needs a walker to treat with the following condition Weakness      10/11/22 1014            No Known  Allergies  Consultations: Cardiology   Procedures/Studies: ECHOCARDIOGRAM LIMITED  Result Date: 10/07/2022    ECHOCARDIOGRAM LIMITED REPORT   Patient Name:   Nichole Hess Date of Exam: 10/06/2022 Medical Rec #:  [id number]      Height:       63.0 in Accession #:    [id number]     Weight:       161.8 lb Date of Birth:  [birth year]      BSA:          1.767 m Patient Age:    89 years       BP:           119/48 mmHg Patient Gender: F              HR:           62 bpm. Exam Location:  [facility] Procedure: 2D Echo, Limited Echo and Limited Color Doppler Indications:     I50.31 Acute Diastolic CHF  History:         Patient has prior history of  Diet - low sodium heart healthy   Complete by: As directed    Increase activity slowly   Complete by: As directed       Allergies as of  10/11/2022   No Known Allergies      Medication List     STOP taking these medications    amiodarone 200 MG tablet Commonly known as: PACERONE   amiodarone 400 MG tablet Commonly known as: PACERONE   metoprolol tartrate 25 MG tablet Commonly known as: LOPRESSOR       TAKE these medications    apixaban 2.5 MG Tabs tablet Commonly known as: ELIQUIS Take 1 tablet (2.5 mg total) by mouth 2 (two) times daily. What changed:  medication strength how much to take   fluticasone 50 MCG/ACT nasal spray Commonly known as: FLONASE Place 2 sprays into both nostrils daily.   furosemide 20 MG tablet Commonly known as: LASIX Take 2 tablets (40 mg total) by mouth 2 (two) times daily. May take an additional 40 mg ( 1 tablet) daily as needed for shortness of breath or abdominal swelling. What changed: when to take this   gabapentin 300 MG capsule Commonly known as: NEURONTIN Take 300 mg by mouth at bedtime.   levothyroxine 75 MCG tablet Commonly known as: SYNTHROID Take 75 mcg by mouth daily before breakfast.   Multi-Vitamins Tabs Take 1 tablet by mouth daily.   Potassium Chloride ER 20 MEQ Tbcr Take 1 tablet (20 mEq total) by mouth daily. May take an additional dose 10 Meq ( 1 tablet) as needed with extra lasix. What changed:  medication strength how much to take   pravastatin 40 MG tablet Commonly known as: PRAVACHOL Take 40 mg by mouth at bedtime.   spironolactone 25 MG tablet Commonly known as: ALDACTONE Take 0.5 tablets (12.5 mg total) by mouth daily.               Durable Medical Equipment  (From admission, onward)           Start     Ordered   10/11/22 1015  For home use only DME Walker rolling  Once       Question Answer Comment  Walker: With 5 Inch Wheels   Patient needs a walker to treat with the following condition Weakness      10/11/22 1014            No Known  Allergies  Consultations: Cardiology   Procedures/Studies: ECHOCARDIOGRAM LIMITED  Result Date: 10/07/2022    ECHOCARDIOGRAM LIMITED REPORT   Patient Name:   Nichole Hess Date of Exam: 10/06/2022 Medical Rec #:  [id number]      Height:       63.0 in Accession #:    [id number]     Weight:       161.8 lb Date of Birth:  [birth year]      BSA:          1.767 m Patient Age:    89 years       BP:           119/48 mmHg Patient Gender: F              HR:           62 bpm. Exam Location:  [facility] Procedure: 2D Echo, Limited Echo and Limited Color Doppler Indications:     I50.31 Acute Diastolic CHF  History:         Patient has prior history of

## 2022-10-17 ENCOUNTER — Other Ambulatory Visit: Payer: Self-pay | Admitting: Gerontology

## 2022-10-17 DIAGNOSIS — Z1231 Encounter for screening mammogram for malignant neoplasm of breast: Secondary | ICD-10-CM

## 2022-10-21 ENCOUNTER — Ambulatory Visit: Payer: Medicare HMO | Attending: Cardiology | Admitting: Cardiology

## 2022-10-21 ENCOUNTER — Encounter: Payer: Medicare HMO | Admitting: Cardiology

## 2022-10-21 VITALS — BP 133/68 | HR 76 | Wt 138.0 lb

## 2022-10-21 DIAGNOSIS — I5021 Acute systolic (congestive) heart failure: Secondary | ICD-10-CM

## 2022-10-21 DIAGNOSIS — I420 Dilated cardiomyopathy: Secondary | ICD-10-CM

## 2022-10-21 DIAGNOSIS — I4891 Unspecified atrial fibrillation: Secondary | ICD-10-CM

## 2022-10-21 MED ORDER — FUROSEMIDE 20 MG PO TABS: 40.0000 mg | ORAL_TABLET | Freq: Every day | ORAL | 3 refills | Status: DC

## 2022-10-21 NOTE — Progress Notes (Unsigned)
   ADVANCED HEART FAILURE NEW PATIENT CLINIC NOTE  Referring Physician: Luciana Axe, NP  Primary Care: Luciana Axe, NP Primary Cardiologist:  HPI: Nichole Hess is a 89 y.o. female with a PMH of  HTN, Ao atherosclerosis, CKD III, HFmrEF, breast cancer, a fib on eliquis, hypothyroidism, and HLD who presents for initial visit for further evaluation and treatment of heart failure/cardiomyopathy after hospitalization for decompensated heart failure.     Patient was recently admitted in 08/2018 for new onset atrial fibrillation and heart failure with mildly reduced ejection fraction.  She was discharged on a beta-blocker, anticoagulation, and diuretics.  But symptomatically worsened with a readmission on 9/17, and then subsequently 9/30.  She was hypotensive, bradycardic, and required intubation.  She was placed on dobutamine and norepinephrine with improvement in her heart rate and blood pressure and was extubated.  She was discharged in sinus bradycardia, with anticoagulation and medical therapy for heart failure.  {Anything typed between these two boxes will persist and can be pulled forward to future notes. This phrase will delete itself when the note is signed :1}      SUBJECTIVE:   PMH, current medications, allergies, social history, and family history reviewed in epic.  PHYSICAL EXAM: There were no vitals filed for this visit. GENERAL: Well nourished and in no apparent distress at rest.  HEENT: Negative for xanthelasma. There is no scleral icterus.  The mucous membranes are pink and moist.   CHEST: There are no chest wall deformities. There is no chest wall tenderness. Respirations are unlabored.  Lungs- Clear to auscultation bilaterally, normal work of breathing. CARDIAC:  JVP: *** cm          Normal rate with regular rhythm. No murmurs, rubs or gallops.  Pulses are 2+ and symmetrical in upper and lower extremities. *** edema.  ABDOMEN: Soft, non-tender, non-distended. Marland Kitchen   EXTREMITIES: Warm and well perfused with no cyanosis, clubbing.  NEUROLOGIC: Patient is oriented x3 with no focal or lateralizing neurologic deficits.  PSYCH: Patients affect is appropriate, there is no evidence of anxiety or depression.  SKIN: Warm and dry; no lesions or wounds.   DATA REVIEW  ECG: 10/06/2022: Sinus bradycardia with first-degree AV block, left bundle branch block  ECHO: 10/06/2022: LVEF 50 to 55%, low normal function, grade 2 diastolic dysfunction, normal RV, moderate mitral regurgitation.    08/27/2022: LVEF 45 to 50%, mildly decreased function, moderate LVH, mildly elevated RVSP, moderate to severe mitral regurgitation.  CATH: None   Heart failure review: - Classification: Heart failure with mildly reduced EF - Etiology: Tachymediated - NYHA Class:  - Volume status: {volumechf:30919} - ACEi/ARB/ARNI: {HF:30752} - Aldosterone antagonist: {HF:30752} - Beta-blocker: {HF:30752} - Digoxin: {HF:30752} - Hydralazine/Nitrates: {HF:30752} - SGLT2i: {HF:30752} - GLP-1: {GLP:30906} - Advanced therapies: {Advancedtherapies:30916} - ICD: {ICD:30901}   ASSESSMENT & PLAN:  Heart failure with minimally reduced ejection fraction:   Atrial fibrillation:  CKD:  Abnormal thyroid panel:    Clearnce Hasten, MD Advanced Heart Failure Mechanical Circulatory Support 10/21/22

## 2022-10-21 NOTE — Patient Instructions (Signed)
Medication Changes:  Decrease Lasix to 40 mg  (2 tablets) daily.   Stop taking Potassium.   Lab Work:  Labs done in one wee.  your results will be available in MyChart, we will contact you for abnormal readings.     Special Instructions // Education:  Do the following things EVERYDAY: Weigh yourself in the morning before breakfast. Write it down and keep it in a log. Take your medicines as prescribed Eat low salt foods--Limit salt (sodium) to 2000 mg per day.  Stay as active as you can everyday Limit all fluids for the day to less than 2 liters   Follow-Up in: please call on December to schedule your January appointment.     If you have any questions or concerns before your next appointment please send [place] a message through [place] or call our office at [phone] Monday-Friday 8 am-5 pm.   If you have an urgent need after hours on the weekend please call your Primary Cardiologist or the Advanced [facility] in [place] at [phone].   At the [facility], you and your health needs are our priority. We have a designated team specialized in the treatment of Heart Failure. This Care Team includes your primary Heart Failure Specialized Cardiologist (physician), Advanced Practice Providers (APPs- Physician Assistants and Nurse Practitioners), and Pharmacist who all work together to provide you with the care you need, when you need it.   You may see any of the following providers on your designated Care Team at your next follow up:  Dr. Arvilla Meres Dr. Marca Ancona Dr. Dorthula Nettles Dr. Theresia Bough Tonye Becket, NP Robbie Lis, [place] [address] [place], [place] Brynda Peon, NP [place] Lee, NP Clarisa Kindred, NP Enos Fling, PharmD

## 2022-10-27 NOTE — Progress Notes (Deleted)
Cardiology Office Note  Date:  10/27/2022   ID:  Nichole Hess, DOB Jan 12, 1933, MRN [id number]  PCP:  Luciana Axe, NP   No chief complaint on file.   HPI:  Nichole Hess is a 89 year old woman with past medical history of  hypertension,  chronic kidney disease stage III,  Hospitalized August 27, 2022 with worsening shortness of breath for 3 weeks prior to admission, PND/orthopnea, Who presents for office follow-up for her atrial fibrillation with RVR, pulmonary edema, bilateral pleural effusions  Recently seen in clinic by myself September 26, 2022 At that time started on amiodarone for atrial fibrillation with discussion of cardioversion Admitted to the hospital end of September 2024 Converted to normal sinus rhythm, presenting with bradycardia, heart rate 30-40 respiratory failure, cardiogenic shock, E. coli UTI treated with ceftriaxone Was intubated, dobutamine infusion Ejection fraction 50% Acute renal failure ATN creatinine 2.8 on admission down to 1.4 discharge      In the hospital August 2024 with pulmonary vascular congestion, atrial fibrillation with RVR Treated with metoprolol, Eliquis, Lasix  In ER 9/17, worsening shortness of breath, treated with lasix IV Felt better and was discharged home  In follow-up today sleeping on 2 pillows, mild shortness of breath on exertion, low energy Interested in restoring normal sinus rhythm  Denies significant lower extremity edema EKG personally reviewed by myself on todays visit      hospitalization August 2024 worsening shortness of breath,  unable to sleep,  family very concerned so they called EMS.   Denies significant leg swelling, no abdominal swelling   Seen in urgent care and transferred to emergency room August 20 for shortness of breath and cough.  She was hypoxic on ambulation primary care 88% on room air Testing negative Chest x-ray bilateral effusions worse on the left, pulmonary vascular  congestion EKG performed showing atrial fibrillation with RVR left bundle branch block BNP over 600  While in the hospital,Rate controlled on metoprolol tartrate 25 twice daily, changed to metoprolol succinate 50 daily at discharge with Eliquis 5 twice daily Plan for outpatient cardioversion Discharged on Lasix 20 daily   PMH:   has a past medical history of Breast cancer (HCC) (06/05/1999), Hypertension, Personal history of radiation therapy, and Thyroid disease.  PSH:    Past Surgical History:  Procedure Laterality Date   BREAST BIOPSY Left 06/05/99   lumpectomy/rad   BREAST LUMPECTOMY Left 2001   CHOLECYSTECTOMY      Current Outpatient Medications  Medication Sig Dispense Refill   apixaban (ELIQUIS) 2.5 MG TABS tablet Take 1 tablet (2.5 mg total) by mouth 2 (two) times daily. 60 tablet 0   fluticasone (FLONASE) 50 MCG/ACT nasal spray Place 2 sprays into both nostrils daily.     furosemide (LASIX) 20 MG tablet Take 2 tablets (40 mg total) by mouth daily. 180 tablet 3   gabapentin (NEURONTIN) 300 MG capsule Take 300 mg by mouth at bedtime.     levothyroxine (SYNTHROID) 75 MCG tablet Take 75 mcg by mouth daily before breakfast.     Multiple Vitamin (MULTI-VITAMINS) TABS Take 1 tablet by mouth daily.     pravastatin (PRAVACHOL) 40 MG tablet Take 40 mg by mouth at bedtime.     spironolactone (ALDACTONE) 25 MG tablet Take 0.5 tablets (12.5 mg total) by mouth daily. 30 tablet 0   No current facility-administered medications for this visit.     Allergies:   Patient has no known allergies.   Social History:  The patient  reports that she quit smoking about 54 years ago. Her smoking use included cigarettes. She started smoking about 64 years ago. She has a 10 pack-year smoking history. She has never used smokeless tobacco. She reports that she does not drink alcohol and does not use drugs.   Family History:   family history includes Heart attack (age of onset: 74) in her father;  Hypertension in her mother; Other in her mother.    Review of Systems: Review of Systems  Constitutional: Negative.   HENT: Negative.    Respiratory: Negative.    Cardiovascular: Negative.   Gastrointestinal: Negative.   Musculoskeletal: Negative.   Neurological: Negative.   Psychiatric/Behavioral: Negative.    All other systems reviewed and are negative.   PHYSICAL EXAM: VS:  There were no vitals taken for this visit. , BMI There is no height or weight on file to calculate BMI. Constitutional:  oriented to person, place, and time. No distress.  HENT:  Head: Grossly normal Eyes:  no discharge. No scleral icterus.  Neck: No JVD, no carotid bruits  Cardiovascular: Regular rate and rhythm, no murmurs appreciated Pulmonary/Chest: Clear to auscultation bilaterally, no wheezes or rails Abdominal: Soft.  no distension.  no tenderness.  Musculoskeletal: Normal range of motion Neurological:  normal muscle tone. Coordination normal. No atrophy Skin: Skin warm and dry Psychiatric: normal affect, pleasant   Recent Labs: 10/05/2022: B Natriuretic Peptide 1,298.0 10/06/2022: TSH 11.330 10/09/2022: ALT 107 10/10/2022: Magnesium 2.2 10/11/2022: BUN 37; Creatinine, Ser 1.31; Hemoglobin 14.1; Platelets 276; Potassium 4.3; Sodium 138    Lipid Panel Lab Results  Component Value Date   CHOL 115 10/07/2022   HDL 42 10/07/2022   LDLCALC 63 10/07/2022   TRIG 51 10/07/2022      Wt Readings from Last 3 Encounters:  10/21/22 138 lb (62.6 kg)  10/11/22 147 lb 11.3 oz (67 kg)  09/26/22 152 lb (68.9 kg)     ASSESSMENT AND PLAN:  Problem List Items Addressed This Visit   None     Atrial fibrillation with RVR Likely developed early August if not in July 2024 Elevated rate on today's visit Recommended she start amiodarone 400 twice daily 7 days then down to 200 twice daily Cardioversion in 2 weeks time if she remains in atrial fibrillation Remain on Eliquis 5 twice daily, does not meet  criteria for reduced dose  Valvular heart disease Moderate to severe tricuspid valve regurgitation, moderate to severe mitral valve regurgitation consistent with atrial fibrillation, pulmonary edema, pulmonary hypertension -Recommend to increase Lasix up to 40 daily, moderate fluid intake, extra Lasix 40 in the afternoon for any shortness of breath or abdominal swelling -Recommend she take potassium 10 mill equivalents with any Lasix dose   Shortness of breath Secondary to pulmonary edema and atrial fibrillation, MR, TR Lasix increased as above   Essential hypertension Blood pressure running low,  Continue metoprolol tartrate 25 twice daily, Lasix 40 daily     Total encounter time more than 40 minutes  Greater than 50% was spent in counseling and coordination of care with the patient    Signed, Dossie Arbour, M.D., Ph.D. [facility] [place], [place] [phone]

## 2022-10-28 ENCOUNTER — Ambulatory Visit: Payer: Medicare HMO | Admitting: Cardiovascular Disease

## 2022-12-01 NOTE — Progress Notes (Unsigned)
Cardiology Office Note  Date:  12/02/2022   ID:  [name], DOB 03-28-1933, MRN [id number]  PCP:  Nichole Axe, NP   Cc: f/u from hospital   HPI:  [name] is a 89 year old woman with past medical history of  hypertension,  chronic kidney disease stage III,  Hospitalized August 27, 2022 with worsening shortness of breath for 3 weeks prior to admission, PND/orthopnea, Who presents for office follow-up for her atrial fibrillation with RVR, pulmonary edema, bilateral pleural effusions  Recently seen in clinic by myself September 20th 2024 For atrial fibrillation Recently in the hospital October 2024, for shortness of breath, had converted to marked sinus bradycardia, respiratory distress, moderate mitral valve regurgitation, shock Was intubated for respiratory distress, started on dobutamine infusion to augment diuresis Wean to BiPAP Echocardiogram ejection fraction 50 to 55%, moderate MR Renal function improved 2.8 down to 1.4 at discharge Amiodarone held at discharge given persistent bradycardia SGLT2 inhibitor held given history of UTI Discharged on Lasix 40 twice daily CHF clinic following, seen October 21, 2022, on that visit Lasix down to 40 daily Potassium 5.2 October test 2024  Complete PT Does weight every morning: 135 from d/c, now 139  In the hospital August 2024 with pulmonary vascular congestion, atrial fibrillation with RVR Treated with metoprolol, Eliquis, Lasix  In ER 9/17, worsening shortness of breath, treated with lasix IV Felt better and was discharged home  In follow-up today sleeping on 2 pillows, mild shortness of breath on exertion, low energy Interested in restoring normal sinus rhythm  Denies significant lower extremity edema  EKG personally reviewed by myself on todays visit EKG Interpretation Date/Time:  Tuesday December 02 2022 14:45:50 EST Ventricular Rate:  60 PR Interval:  168 QRS Duration:  136 QT Interval:  470 QTC  Calculation: 470 R Axis:   -88  Text Interpretation: Normal sinus rhythm Left axis deviation Non-specific intra-ventricular conduction block Minimal voltage criteria for LVH, may be normal variant ( Cornell product ) Cannot rule out Septal infarct , age undetermined T wave abnormality, consider lateral ischemia When compared with ECG of 06-Oct-2022 03:31, rate has increased Confirmed by Julien Nordmann ([id number]) on 12/02/2022 3:14:42 PM     hospitalization August 2024 worsening shortness of breath,  unable to sleep,  family very concerned so they called EMS.   Denies significant leg swelling, no abdominal swelling   Seen in urgent care and transferred to emergency room August 20 for shortness of breath and cough.  She was hypoxic on ambulation primary care 88% on room air Testing negative Chest x-ray bilateral effusions worse on the left, pulmonary vascular congestion EKG performed showing atrial fibrillation with RVR left bundle branch block BNP over 600  While in the hospital,Rate controlled on metoprolol tartrate 25 twice daily, changed to metoprolol succinate 50 daily at discharge with Eliquis 5 twice daily Plan for outpatient cardioversion Discharged on Lasix 20 daily   PMH:   has a past medical history of Breast cancer (HCC) (06/05/1999), Hypertension, Personal history of radiation therapy, and Thyroid disease.  PSH:    Past Surgical History:  Procedure Laterality Date   BREAST BIOPSY Left 06/05/99   lumpectomy/rad   BREAST LUMPECTOMY Left 2001   CHOLECYSTECTOMY      Current Outpatient Medications  Medication Sig Dispense Refill   apixaban (ELIQUIS) 2.5 MG TABS tablet Take 1 tablet (2.5 mg total) by mouth 2 (two) times daily. 60 tablet 0   fluticasone (FLONASE) 50 MCG/ACT nasal spray Place 2  sprays into both nostrils daily.     furosemide (LASIX) 20 MG tablet Take 2 tablets (40 mg total) by mouth daily. 180 tablet 3   gabapentin (NEURONTIN) 300 MG capsule Take 300 mg by  mouth at bedtime.     levothyroxine (SYNTHROID) 75 MCG tablet Take 75 mcg by mouth daily before breakfast.     Multiple Vitamin (MULTI-VITAMINS) TABS Take 1 tablet by mouth daily.     spironolactone (ALDACTONE) 25 MG tablet Take 0.5 tablets (12.5 mg total) by mouth daily. 30 tablet 0   pravastatin (PRAVACHOL) 40 MG tablet Take 1 tablet (40 mg total) by mouth at bedtime. 90 tablet 3   No current facility-administered medications for this visit.    Allergies:   Patient has no known allergies.   Social History:  The patient  reports that she quit smoking about 54 years ago. Her smoking use included cigarettes. She started smoking about 64 years ago. She has a 10 pack-year smoking history. She has never used smokeless tobacco. She reports that she does not drink alcohol and does not use drugs.   Family History:   family history includes Heart attack (age of onset: 60) in her father; Hypertension in her mother; Other in her mother.    Review of Systems: Review of Systems  Constitutional: Negative.   HENT: Negative.    Respiratory: Negative.    Cardiovascular: Negative.   Gastrointestinal: Negative.   Musculoskeletal: Negative.   Neurological: Negative.   Psychiatric/Behavioral: Negative.    All other systems reviewed and are negative.   PHYSICAL EXAM: VS:  BP 130/66   Pulse 60   Ht 5\' 3"  (1.6 m)   Wt 139 lb (63 kg)   SpO2 96%   BMI 24.62 kg/m  , BMI Body mass index is 24.62 kg/m. Constitutional:  oriented to person, place, and time. No distress.  HENT:  Head: Grossly normal Eyes:  no discharge. No scleral icterus.  Neck: No JVD, no carotid bruits  Cardiovascular: Regular rate and rhythm, no murmurs appreciated Pulmonary/Chest: Clear to auscultation bilaterally, no wheezes or rails Abdominal: Soft.  no distension.  no tenderness.  Musculoskeletal: Normal range of motion Neurological:  normal muscle tone. Coordination normal. No atrophy Skin: Skin warm and dry Psychiatric:  normal affect, pleasant   Recent Labs: 10/05/2022: B Natriuretic Peptide 1,298.0 10/06/2022: TSH 11.330 10/09/2022: ALT 107 10/10/2022: Magnesium 2.2 10/11/2022: BUN 37; Creatinine, Ser 1.31; Hemoglobin 14.1; Platelets 276; Potassium 4.3; Sodium 138    Lipid Panel Lab Results  Component Value Date   CHOL 115 10/07/2022   HDL 42 10/07/2022   LDLCALC 63 10/07/2022   TRIG 51 10/07/2022      Wt Readings from Last 3 Encounters:  12/02/22 139 lb (63 kg)  10/21/22 138 lb (62.6 kg)  10/11/22 147 lb 11.3 oz (67 kg)     ASSESSMENT AND PLAN:  Problem List Items Addressed This Visit       Cardiology Problems   Atrial fibrillation with RVR (HCC)   Relevant Medications   pravastatin (PRAVACHOL) 40 MG tablet   Other Relevant Orders   EKG 12-Lead (Completed)   Acute HFrEF (heart failure with reduced ejection fraction) (HCC)   Relevant Medications   pravastatin (PRAVACHOL) 40 MG tablet   Other Relevant Orders   EKG 12-Lead (Completed)   Dilated cardiomyopathy (HCC) - Primary   Relevant Medications   pravastatin (PRAVACHOL) 40 MG tablet   Other Relevant Orders   EKG 12-Lead (Completed)     Other  CKD (chronic kidney disease) stage 3, GFR 30-59 ml/min (HCC)   SOB (shortness of breath)   Other Visit Diagnoses     Tricuspid valve insufficiency, unspecified etiology       Relevant Medications   pravastatin (PRAVACHOL) 40 MG tablet   Mitral valve insufficiency, unspecified etiology       Relevant Medications   pravastatin (PRAVACHOL) 40 MG tablet   Chronic pulmonary edema       Essential hypertension       Relevant Medications   pravastatin (PRAVACHOL) 40 MG tablet   Medication management       Relevant Orders   Basic metabolic panel       Atrial fibrillation with RVR Likely developed early August if not in July 2024 Converted to sinus bradycardia on amiodarone 400 twice daily after 6 days Required hospital admission for symptomatic bradycardia, CHF exacerbation with  respiratory distress On Eliquis, amiodarone held at the time of discharge Not on any AV nodal blockers For recurrent symptomatic atrial fibrillation, could consider cardioversion, discussion with the EP would likely be required, likely underlying sick sinus syndrome  Valvular heart disease/CHF On Lasix 40 daily, appears euvolemic Repeat BMP today, for worsening potassium may need to hold spironolactone   Essential hypertension Blood pressure is well controlled on today's visit. No changes made to the medications.    Long discussion concerning recent hospital course, management of her CHF, history of arrhythmia, discussed medications in detail   Signed, Dossie Arbour, M.D., Ph.D. [facility] [place], [place] [phone]

## 2022-12-02 ENCOUNTER — Encounter: Payer: Self-pay | Admitting: Cardiovascular Disease

## 2022-12-02 ENCOUNTER — Ambulatory Visit: Payer: Medicare HMO | Attending: Cardiovascular Disease | Admitting: Cardiovascular Disease

## 2022-12-02 VITALS — BP 130/66 | HR 60 | Ht 63.0 in | Wt 139.0 lb

## 2022-12-02 DIAGNOSIS — N183 Chronic kidney disease, stage 3 unspecified: Secondary | ICD-10-CM

## 2022-12-02 DIAGNOSIS — I4891 Unspecified atrial fibrillation: Secondary | ICD-10-CM

## 2022-12-02 DIAGNOSIS — I5021 Acute systolic (congestive) heart failure: Secondary | ICD-10-CM

## 2022-12-02 DIAGNOSIS — R0602 Shortness of breath: Secondary | ICD-10-CM

## 2022-12-02 DIAGNOSIS — I340 Nonrheumatic mitral (valve) insufficiency: Secondary | ICD-10-CM

## 2022-12-02 DIAGNOSIS — I071 Rheumatic tricuspid insufficiency: Secondary | ICD-10-CM | POA: Diagnosis not present

## 2022-12-02 DIAGNOSIS — I10 Essential (primary) hypertension: Secondary | ICD-10-CM

## 2022-12-02 DIAGNOSIS — J811 Chronic pulmonary edema: Secondary | ICD-10-CM

## 2022-12-02 DIAGNOSIS — I420 Dilated cardiomyopathy: Secondary | ICD-10-CM

## 2022-12-02 DIAGNOSIS — Z79899 Other long term (current) drug therapy: Secondary | ICD-10-CM

## 2022-12-02 MED ORDER — PRAVASTATIN SODIUM 40 MG PO TABS: 40.0000 mg | ORAL_TABLET | Freq: Every day | ORAL | 3 refills | Status: DC

## 2022-12-02 NOTE — Patient Instructions (Addendum)
Medication Instructions:  No changes  If you need a refill on your cardiac medications before your next appointment, please call your pharmacy.   Lab work: [employer] today  Testing/Procedures: No new testing needed  Follow-Up: At [facility], you and your health needs are our priority.  As part of our continuing mission to provide you with exceptional heart care, we have created designated Provider Care Teams.  These Care Teams include your primary Cardiologist (physician) and Advanced Practice Providers (APPs -  Physician Assistants and Nurse Practitioners) who all work together to provide you with the care you need, when you need it.  You will need a follow up appointment in 6 months  Providers on your designated Care Team:   Nicolasa Ducking, NP Eula Listen, PA-C Cadence Fransico Michael, [place]  COVID-19 Vaccine Information can be found at: [email or web address] For questions related to vaccine distribution or appointments, please email [email or web address] or call [phone].

## 2022-12-03 ENCOUNTER — Other Ambulatory Visit: Payer: Self-pay | Admitting: Emergency Medicine

## 2022-12-03 LAB — BASIC METABOLIC PANEL
BUN/Creatinine Ratio: 16 (ref 12–28)
BUN: 20 mg/dL (ref 8–27)
CO2: 26 mmol/L (ref 20–29)
Calcium: 9.6 mg/dL (ref 8.7–10.3)
Chloride: 105 mmol/L (ref 96–106)
Creatinine, Ser: 1.23 mg/dL — ABNORMAL HIGH (ref 0.57–1.00)
Glucose: 75 mg/dL (ref 70–99)
Potassium: 4 mmol/L (ref 3.5–5.2)
Sodium: 145 mmol/L — ABNORMAL HIGH (ref 134–144)
eGFR: 43 mL/min/{1.73_m2} — ABNORMAL LOW (ref >59)

## 2022-12-03 MED ORDER — SPIRONOLACTONE 25 MG PO TABS: 12.5000 mg | ORAL_TABLET | Freq: Every day | ORAL | 3 refills | Status: DC

## 2022-12-03 MED ORDER — APIXABAN 2.5 MG PO TABS: 2.5000 mg | ORAL_TABLET | Freq: Two times a day (BID) | ORAL | 3 refills | Status: DC

## 2022-12-08 ENCOUNTER — Telehealth: Payer: Self-pay | Admitting: Cardiovascular Disease

## 2022-12-08 NOTE — Telephone Encounter (Signed)
Patient's daughter is calling requesting the patient's lab results. Please advise.

## 2022-12-08 NOTE — Telephone Encounter (Signed)
The patient's daughter has been made aware of results. She would like to know if the patient should stay on the spironolactone. She stated that it is expensive for them.    Antonieta Iba, MD 12/07/2022  4:50 PM EST Back to Top    Lab work reviewed Renal function continues to improve Potassium stable, no changes to medications needed

## 2022-12-11 ENCOUNTER — Ambulatory Visit
Admission: RE | Admit: 2022-12-11 | Discharge: 2022-12-11 | Disposition: A | Discharge status: Home / Self Care | Payer: Medicare HMO | Source: Ambulatory Visit | Attending: Gerontology | Admitting: Gerontology

## 2022-12-11 DIAGNOSIS — Z1231 Encounter for screening mammogram for malignant neoplasm of breast: Secondary | ICD-10-CM | POA: Diagnosis present

## 2022-12-15 MED ORDER — APIXABAN 2.5 MG PO TABS: 2.5000 mg | ORAL_TABLET | Freq: Two times a day (BID) | ORAL | 3 refills | Status: DC

## 2022-12-15 MED ORDER — SPIRONOLACTONE 25 MG PO TABS: 12.5000 mg | ORAL_TABLET | Freq: Every day | ORAL | 3 refills | Status: DC

## 2022-12-15 NOTE — Telephone Encounter (Signed)
Called and spoke with patient.   Yes I would stay on current medications If spironolactone is expensive I would talk to her pharmacist, this is generic I suspect he may be talking about Eliquis Co-pay on Eliquis should change somewhat next year after her deductible Thx TGollan   Patient verbalizes understanding. Patient requesting that prescriptions be sent to [facility] in [place].

## 2023-01-05 ENCOUNTER — Telehealth: Payer: Self-pay | Admitting: Cardiovascular Disease

## 2023-01-05 NOTE — Telephone Encounter (Signed)
Pt c/o medication issue:  1. Name of Medication:   furosemide (LASIX) 20 MG tablet   2. How are you currently taking this medication (dosage and times per day)?   3. Are you having a reaction (difficulty breathing--STAT)?   4. What is your medication issue?   Daughter Cordelia Pen) stated patient is currently taking a 40 mg dosage of this medication once and day and wants to confirm if she should continue on this dosage or should she be taking the 20 mg twice daily.

## 2023-01-05 NOTE — Telephone Encounter (Signed)
Spoke with patients daughter and she was calling to confirm dose of lasix. She reports getting a call from pharmacy that prescription for lasix 20 mg tablets were ready for pick up. She just wanted to confirm the dose she should be taking which is Lasix 40 mg once daily. She read back all information with no further questions at this time.

## 2023-01-26 ENCOUNTER — Ambulatory Visit: Payer: Medicare Other | Attending: Cardiology | Admitting: Cardiology

## 2023-01-26 VITALS — BP 160/77 | HR 64 | Wt 143.0 lb

## 2023-01-26 DIAGNOSIS — I5020 Unspecified systolic (congestive) heart failure: Secondary | ICD-10-CM

## 2023-01-26 DIAGNOSIS — N1831 Chronic kidney disease, stage 3a: Secondary | ICD-10-CM | POA: Diagnosis not present

## 2023-01-26 DIAGNOSIS — I4891 Unspecified atrial fibrillation: Secondary | ICD-10-CM

## 2023-01-26 DIAGNOSIS — I5032 Chronic diastolic (congestive) heart failure: Secondary | ICD-10-CM

## 2023-01-26 NOTE — Patient Instructions (Signed)
Great to see you today!!!  No changes were made, please continue current medications  Special Instructions // Education:  Do the following things EVERYDAY: Weigh yourself in the morning before breakfast. Write it down and keep it in a log. Take your medicines as prescribed Eat low salt foods--Limit salt (sodium) to 2000 mg per day.  Stay as active as you can everyday Limit all fluids for the day to less than 2 liters   Follow-Up in: 6 months with an echocardiogram    If you have any questions or concerns before your next appointment please send [place] a message through mychart or call our office at [phone] Monday-Friday 8 am-5 pm.   If you have an urgent need after hours on the weekend please call your Primary Cardiologist or the [facility] in [place] at [phone].   At the [facility], you and your health needs are our priority. We have a designated team specialized in the treatment of Heart Failure. This Care Team includes your primary Heart Failure Specialized Cardiologist (physician), Advanced Practice Providers (APPs- Physician Assistants and Nurse Practitioners), and Pharmacist who all work together to provide you with the care you need, when you need it.   You may see any of the following providers on your designated Care Team at your next follow up:  Dr. Arvilla Meres Dr. Marca Ancona Dr. Dorthula Nettles Dr. Theresia Bough Tonye Becket, NP Robbie Lis, [place] [address] [place], [place] Brynda Peon, NP [place] Lee, NP Clarisa Kindred, NP Enos Fling, PharmD

## 2023-01-26 NOTE — Progress Notes (Unsigned)
   ADVANCED HEART FAILURE FOLLOW UP CLINIC NOTE  Referring Physician: Luciana Axe, NP  Primary Care: Luciana Axe, NP Primary Cardiologist:  HPI: Nichole Hess is a 89 y.o. female who presents for follow up of chronic diastolic heart failure.      {Anything typed between these two boxes will persist and can be pulled forward to future notes. This phrase will delete itself when the note is signed :1}   Patient was recently admitted in 08/2018 for new onset atrial fibrillation and heart failure with mildly reduced ejection fraction.  She was discharged on a beta-blocker, anticoagulation, and diuretics.  But symptomatically worsened with a readmission on 9/17, and then subsequently 9/30.  She was hypotensive, bradycardic, and required intubation.  She was placed on dobutamine and norepinephrine with improvement in her heart rate and blood pressure and was extubated.  She was discharged in sinus bradycardia, with anticoagulation and medical therapy for heart failure.        SUBJECTIVE:  PMH, current medications, allergies, social history, and family history reviewed in epic.  PHYSICAL EXAM: Vitals:   01/26/23 1412  Pulse: 64  SpO2: 98%   GENERAL: Well nourished and in no apparent distress at rest.  HEENT: The mucous membranes are pink and moist.   PULM:  Normal work of breathing, clear to auscultation bilaterally. Respirations are unlabored.  CARDIAC:  JVP: ***         Normal rate with regular rhythm. No murmurs, rubs or gallops.  *** edema.  ABDOMEN: Soft, non-tender, non-distended. NEUROLOGIC: Patient is oriented x3 with no focal or lateralizing neurologic deficits.  PSYCH: Patients affect is appropriate, there is no evidence of anxiety or depression.  SKIN: Warm and dry; no lesions or wounds. Warm and well perfused extremities.  DATA REVIEW  ECG: ***  As per my personal interpretation  ECHO: *** As per my personal interpretation  CATH: *** As per my  personal interpretation  Heart failure review: - Classification: {HFCLASS:30917} - Etiology: {Cardiomyopathy:30918} - NYHA Class:  - Volume status: {volumechf:30919} - ACEi/ARB/ARNI: {HF:30752} - Aldosterone antagonist: {HF:30752} - Beta-blocker: {HF:30752} - Digoxin: {HF:30752} - Hydralazine/Nitrates: {HF:30752} - SGLT2i: {HF:30752} - GLP-1: {GLP:30906} - Advanced therapies: {Advancedtherapies:30916} - ICD: {ICD:30901}  ASSESSMENT & PLAN:  ***    Clearnce Hasten, MD Advanced Heart Failure Mechanical Circulatory Support 01/26/23

## 2023-01-28 DIAGNOSIS — I5032 Chronic diastolic (congestive) heart failure: Secondary | ICD-10-CM | POA: Insufficient documentation

## 2023-03-26 ENCOUNTER — Telehealth: Payer: Self-pay | Admitting: Cardiovascular Disease

## 2023-03-26 MED ORDER — APIXABAN 5 MG PO TABS: 5.0000 mg | ORAL_TABLET | Freq: Two times a day (BID) | ORAL | Status: DC

## 2023-03-26 NOTE — Telephone Encounter (Signed)
 Pavero, Cristal Deer, RPH  You4 minutes ago (11:05 AM)    Agree. Lets switch to 5mg  BID   Pt should be taking Eliquis 5mg  BID. Changed medication list to reflect dosage change. Will forward back to nurse to advise daughter dosage change is appropriate.

## 2023-03-26 NOTE — Telephone Encounter (Signed)
 Pt c/o medication issue:  1. Name of Medication:  apixaban (ELIQUIS) 2.5 MG TABS tablet  2. How are you currently taking this medication (dosage and times per day)?   3. Are you having a reaction (difficulty breathing--STAT)?   4. What is your medication issue?   Daughter says she picked up prescription and realized it was written for patient to take one 5 MG tablet twice daily, prescribed by Arnetha Courser, MD. Daughter assumes this was ordered while she was hospitalized in October, but possible an error. Medication list doesn't reflect this. She says patient has always taken Eliquis 2.5 MG twice daily and she wanted to inform Dr. Mariah Milling of this + see what he recommends. Daughter says this morning patient split the 5 MG tablet in half.

## 2023-03-26 NOTE — Telephone Encounter (Signed)
 Called patient and daughter of the message from [facility].   Patient daughter verbalized understanding.   She needed to scheduled 6 month follow up- scheduled with Dr.Gollan 05/25/2023 at 2:20 per patient and daughter. Last OV in November.  Thanks!

## 2023-03-26 NOTE — Telephone Encounter (Signed)
 Pt last saw Dr Mariah Milling 12/02/22, last labs 02/02/23 Creat 1.1, age 89, weight 64.9kg, based on specified criteria pt should be on Eliquis 5mg  BID, this is the appropriate dosage based on most recent serum creatinine. Pt's serum Creat has historically been elevated, but since 10/24 Creat 1.41, 12/02/22 Creat 1.23, and most recent 02/02/23 Creat 1.1. Given kidney function improvement creat less than1.5 and weight greater than 60kg.  Eliquis 5mg  BID is appropriate dosage.   Will have pharmacist review to make sure dosage change is appropriate.

## 2023-05-24 NOTE — Progress Notes (Signed)
 Cardiology Office Note  Date:  05/25/2023   ID:  Nichole Hess, DOB Jul 11, 1933, MRN [id number]  PCP:  Efraim Grange, NP   Chief Complaint  Patient presents with   6 month follow up     "Doing well."     HPI:  [name] is a 89 year old woman with past medical history of  hypertension,  chronic kidney disease stage III,  Hospitalized August 27, 2022 with worsening shortness of breath for 3 weeks prior to admission, PND/orthopnea, Who presents for office follow-up for her atrial fibrillation with RVR, pulmonary edema, bilateral pleural effusions  Last seen by myself in clinic November 2024 In follow-up today reports that she feels well Denies significant tachycardia or palpitations concerning for arrhythmia Family who presents with her today reports blood pressure and heart rate has been steady, well-controlled  Denies significant chest pain concerning for angina Tolerating Lasix  40 daily  Prior imaging reviewed 08/27/2022: LVEF 45 to 50%, mildly decreased function, moderate LVH, mildly elevated RVSP, moderate to severe mitral regurgitation.   Follow-up echo September 2024 EF 50 to 55%  Home blood pressure  120-130/60-70 Weight 137 -rare up to 141  EKG personally reviewed by myself on todays visit EKG Interpretation Date/Time:  Monday May 25 2023 14:39:42 EDT Ventricular Rate:  59 PR Interval:  134 QRS Duration:  144 QT Interval:  486 QTC Calculation: 481 R Axis:   -50  Text Interpretation: Sinus bradycardia Left axis deviation Left bundle branch block When compared with ECG of 02-Dec-2022 14:45, No significant change was found Confirmed by Belva Boyden [phone]) on 05/25/2023 2:48:13 PM    hospital October 2024, for shortness of breath, had converted to marked sinus bradycardia, respiratory distress, moderate mitral valve regurgitation, shock Was intubated for respiratory distress, started on dobutamine  infusion to augment diuresis Wean to  BiPAP Echocardiogram ejection fraction 50 to 55%, moderate MR Renal function improved 2.8 down to 1.4 at discharge Amiodarone  held at discharge given persistent bradycardia SGLT2 inhibitor held given history of UTI Discharged on Lasix  40 twice daily CHF clinic following, seen October 21, 2022, on that visit Lasix  down to 40 daily Potassium 5.2 October test 2024  In the hospital August 2024 with pulmonary vascular congestion, atrial fibrillation with RVR Treated with metoprolol , Eliquis , Lasix   EKG personally reviewed by myself on todays visit EKG Interpretation Date/Time:  Monday May 25 2023 14:39:42 EDT Ventricular Rate:  59 PR Interval:  134 QRS Duration:  144 QT Interval:  486 QTC Calculation: 481 R Axis:   -50  Text Interpretation: Sinus bradycardia Left axis deviation Left bundle branch block When compared with ECG of 02-Dec-2022 14:45, No significant change was found Confirmed by Belva Boyden ([id number]) on 05/25/2023 2:48:13 PM   hospitalization August 2024 worsening shortness of breath,  unable to sleep,  family very concerned so they called EMS.   Denies significant leg swelling, no abdominal swelling   Seen in urgent care and transferred to emergency room August 20 for shortness of breath and cough.  She was hypoxic on ambulation primary care 88% on room air Testing negative Chest x-ray bilateral effusions worse on the left, pulmonary vascular congestion EKG performed showing atrial fibrillation with RVR left bundle branch block BNP over 600  While in the hospital,Rate controlled on metoprolol  tartrate 25 twice daily, changed to metoprolol  succinate 50 daily at discharge with Eliquis  5 twice daily Plan for outpatient cardioversion Discharged on Lasix  20 daily   PMH:   has a past medical history  of Breast cancer (HCC) (06/05/1999), Hypertension, Personal history of radiation therapy, and Thyroid disease.  PSH:    Past Surgical History:  Procedure Laterality Date    BREAST BIOPSY Left 06/05/99   lumpectomy/rad   BREAST LUMPECTOMY Left 2001   CHOLECYSTECTOMY      Current Outpatient Medications  Medication Sig Dispense Refill   apixaban  (ELIQUIS ) 5 MG TABS tablet Take 1 tablet (5 mg total) by mouth 2 (two) times daily.     Cholecalciferol (VITAMIN D3) 50 MCG (2000 UT) capsule Take 2,000 Units by mouth daily.     fluticasone (FLONASE) 50 MCG/ACT nasal spray Place 2 sprays into both nostrils daily.     furosemide  (LASIX ) 20 MG tablet Take 2 tablets (40 mg total) by mouth daily. 180 tablet 3   gabapentin  (NEURONTIN ) 300 MG capsule Take 300 mg by mouth at bedtime.     levothyroxine  (SYNTHROID ) 75 MCG tablet Take 75 mcg by mouth daily before breakfast.     losartan  (COZAAR ) 50 MG tablet Take 1 tablet by mouth at bedtime.     Multiple Vitamin (MULTI-VITAMINS) TABS Take 1 tablet by mouth daily.     pravastatin  (PRAVACHOL ) 40 MG tablet Take 1 tablet (40 mg total) by mouth at bedtime. 90 tablet 3   spironolactone  (ALDACTONE ) 25 MG tablet Take 12.5 mg by mouth daily.     No current facility-administered medications for this visit.   Allergies:   Patient has no known allergies.   Social History:  The patient  reports that she quit smoking about 54 years ago. Her smoking use included cigarettes. She started smoking about 64 years ago. She has a 10 pack-year smoking history. She has never used smokeless tobacco. She reports that she does not drink alcohol and does not use drugs.   Family History:   family history includes Heart attack (age of onset: 72) in her father; Hypertension in her mother; Other in her mother.    Review of Systems: Review of Systems  Constitutional: Negative.   HENT: Negative.    Respiratory: Negative.    Cardiovascular: Negative.   Gastrointestinal: Negative.   Musculoskeletal: Negative.   Neurological: Negative.   Psychiatric/Behavioral: Negative.    All other systems reviewed and are negative.   PHYSICAL EXAM: VS:  BP 138/80  (BP Location: Left Arm, Patient Position: Sitting, Cuff Size: Normal)   Pulse (!) 59   Ht 5\' 4"  (1.626 m)   Wt 139 lb (63 kg)   SpO2 96%   BMI 23.86 kg/m  , BMI Body mass index is 23.86 kg/m. Constitutional:  oriented to person, place, and time. No distress.  HENT:  Head: Grossly normal Eyes:  no discharge. No scleral icterus.  Neck: No JVD, no carotid bruits  Cardiovascular: Regular rate and rhythm, no murmurs appreciated Pulmonary/Chest: Clear to auscultation bilaterally, no wheezes or rails Abdominal: Soft.  no distension.  no tenderness.  Musculoskeletal: Normal range of motion Neurological:  normal muscle tone. Coordination normal. No atrophy Skin: Skin warm and dry Psychiatric: normal affect, pleasant  Recent Labs: 10/05/2022: B Natriuretic Peptide 1,298.0 10/06/2022: TSH 11.330 10/09/2022: ALT 107 10/10/2022: Magnesium 2.2 10/11/2022: Hemoglobin 14.1; Platelets 276 12/02/2022: BUN 20; Creatinine, Ser 1.23; Potassium 4.0; Sodium 145    Lipid Panel Lab Results  Component Value Date   CHOL 115 10/07/2022   HDL 42 10/07/2022   LDLCALC 63 10/07/2022   TRIG 51 10/07/2022    Wt Readings from Last 3 Encounters:  05/25/23 139 lb (63 kg)  01/26/23  143 lb (64.9 kg)  12/02/22 139 lb (63 kg)    ASSESSMENT AND PLAN:  Problem List Items Addressed This Visit       Cardiology Problems   Chronic diastolic heart failure (HCC)   Relevant Medications   losartan  (COZAAR ) 50 MG tablet   spironolactone  (ALDACTONE ) 25 MG tablet   Other Relevant Orders   EKG 12-Lead (Completed)   Atrial fibrillation with RVR (HCC)   Relevant Medications   losartan  (COZAAR ) 50 MG tablet   spironolactone  (ALDACTONE ) 25 MG tablet   Other Relevant Orders   EKG 12-Lead (Completed)   Dilated cardiomyopathy (HCC)   Relevant Medications   losartan  (COZAAR ) 50 MG tablet   spironolactone  (ALDACTONE ) 25 MG tablet     Other   CKD (chronic kidney disease) stage 3, GFR 30-59 ml/min (HCC)   SOB  (shortness of breath)   Other Visit Diagnoses       HFrEF (heart failure with reduced ejection fraction) (HCC)    -  Primary   Relevant Medications   losartan  (COZAAR ) 50 MG tablet   spironolactone  (ALDACTONE ) 25 MG tablet   Other Relevant Orders   EKG 12-Lead (Completed)     Mitral valve insufficiency, unspecified etiology       Relevant Medications   losartan  (COZAAR ) 50 MG tablet   spironolactone  (ALDACTONE ) 25 MG tablet   Other Relevant Orders   EKG 12-Lead (Completed)     Tricuspid valve insufficiency, unspecified etiology       Relevant Medications   losartan  (COZAAR ) 50 MG tablet   spironolactone  (ALDACTONE ) 25 MG tablet     Essential hypertension       Relevant Medications   losartan  (COZAAR ) 50 MG tablet   spironolactone  (ALDACTONE ) 25 MG tablet   Other Relevant Orders   EKG 12-Lead (Completed)     Chronic pulmonary edema          Atrial fibrillation with RVR developed July or August 2024 Converted to sinus bradycardia on amiodarone  400 twice daily after 6 days Required hospital admission for symptomatic bradycardia, CHF exacerbation with respiratory distress On Eliquis , amiodarone  held at the time of discharge Not on any AV nodal blockers For recurrent symptomatic atrial fibrillation, could consider cardioversion,  likely underlying sick sinus syndrome Maintain mean normal sinus rhythm on today's visit  Valvular heart disease/CHF On Lasix  40 daily, appears euvolemic Normal BMP January 2025   Essential hypertension Blood pressure is well controlled on today's visit. No changes made to the medications.    Signed, Juanda Noon, M.D., Ph.D. [facility] [place], [place] [phone]

## 2023-05-25 ENCOUNTER — Ambulatory Visit: Attending: Cardiovascular Disease | Admitting: Cardiovascular Disease

## 2023-05-25 VITALS — BP 138/80 | HR 59 | Ht 64.0 in | Wt 139.0 lb

## 2023-05-25 DIAGNOSIS — I5032 Chronic diastolic (congestive) heart failure: Secondary | ICD-10-CM

## 2023-05-25 DIAGNOSIS — I4891 Unspecified atrial fibrillation: Secondary | ICD-10-CM | POA: Diagnosis not present

## 2023-05-25 DIAGNOSIS — I5020 Unspecified systolic (congestive) heart failure: Secondary | ICD-10-CM | POA: Diagnosis not present

## 2023-05-25 DIAGNOSIS — I071 Rheumatic tricuspid insufficiency: Secondary | ICD-10-CM

## 2023-05-25 DIAGNOSIS — R0602 Shortness of breath: Secondary | ICD-10-CM

## 2023-05-25 DIAGNOSIS — I10 Essential (primary) hypertension: Secondary | ICD-10-CM

## 2023-05-25 DIAGNOSIS — I420 Dilated cardiomyopathy: Secondary | ICD-10-CM

## 2023-05-25 DIAGNOSIS — J811 Chronic pulmonary edema: Secondary | ICD-10-CM

## 2023-05-25 DIAGNOSIS — N1831 Chronic kidney disease, stage 3a: Secondary | ICD-10-CM

## 2023-05-25 DIAGNOSIS — I340 Nonrheumatic mitral (valve) insufficiency: Secondary | ICD-10-CM

## 2023-05-25 MED ORDER — SPIRONOLACTONE 25 MG PO TABS: 12.5000 mg | ORAL_TABLET | Freq: Every day | ORAL | 3 refills | Status: AC

## 2023-05-25 MED ORDER — FUROSEMIDE 40 MG PO TABS: 40.0000 mg | ORAL_TABLET | Freq: Every day | ORAL | 3 refills | Status: AC

## 2023-05-25 MED ORDER — FUROSEMIDE 40 MG PO TABS: 40.0000 mg | ORAL_TABLET | Freq: Every day | ORAL | 3 refills | Status: DC

## 2023-05-25 MED ORDER — PRAVASTATIN SODIUM 40 MG PO TABS: 40.0000 mg | ORAL_TABLET | Freq: Every day | ORAL | 3 refills | Status: DC

## 2023-05-25 MED ORDER — LOSARTAN POTASSIUM 50 MG PO TABS: 50.0000 mg | ORAL_TABLET | Freq: Every day | ORAL | 3 refills | Status: AC

## 2023-05-25 MED ORDER — PRAVASTATIN SODIUM 40 MG PO TABS: 40.0000 mg | ORAL_TABLET | Freq: Every day | ORAL | 3 refills | Status: AC

## 2023-05-25 MED ORDER — APIXABAN 5 MG PO TABS: 5.0000 mg | ORAL_TABLET | Freq: Two times a day (BID) | ORAL | 3 refills | Status: DC

## 2023-05-25 MED ORDER — LOSARTAN POTASSIUM 50 MG PO TABS: 50.0000 mg | ORAL_TABLET | Freq: Every day | ORAL | 3 refills | Status: DC

## 2023-05-25 MED ORDER — SPIRONOLACTONE 25 MG PO TABS: 12.5000 mg | ORAL_TABLET | Freq: Every day | ORAL | 4 refills | Status: DC

## 2023-05-25 NOTE — Patient Instructions (Signed)

## 2023-05-25 NOTE — Addendum Note (Signed)
 Addended by: Chapman Commodore on: 05/25/2023 03:14 PM   Modules accepted: Orders

## 2023-07-29 ENCOUNTER — Telehealth: Payer: Self-pay | Admitting: Cardiovascular Disease

## 2023-07-29 DIAGNOSIS — I4891 Unspecified atrial fibrillation: Secondary | ICD-10-CM

## 2023-07-29 MED ORDER — APIXABAN 5 MG PO TABS: 5.0000 mg | ORAL_TABLET | Freq: Two times a day (BID) | ORAL | 1 refills | Status: DC

## 2023-07-29 NOTE — Telephone Encounter (Signed)
 Prescription refill request for Eliquis  received. Indication:  Afib  Last office visit: 05/25/23 [place])  Scr: 1.1 (02/02/23)  Age: 89 Weight: 63kg  Appropriate dose. Refill sent.

## 2023-07-29 NOTE — Telephone Encounter (Signed)
*  STAT* If patient is at the pharmacy, call can be transferred to refill team.   1. Whichapixaban (ELIQUIS ) 5 MG TABS tablet  medications need to be refilled? (please list name of each medication and dose if known)    2. Would you like to learn more about the convenience, safety, & potential cost savings by using the [facility]?    3. Are you open to using the [facility] (Type [facility].  ).   4. Which pharmacy/location (including street and city if local pharmacy) is medication to be sent to? [id number] - [place], [place] - [address] [place] [address]    5. Do they need a 30 day or 90 day supply? 90 day

## 2023-09-01 ENCOUNTER — Other Ambulatory Visit: Payer: Self-pay

## 2023-09-01 DIAGNOSIS — I5032 Chronic diastolic (congestive) heart failure: Secondary | ICD-10-CM

## 2023-09-01 NOTE — Progress Notes (Signed)
 Placed new Echo order to [facility] from recall list. [facility] will schedule echo from [facility]. Pt also needs f/u with Zenaida after Echo. October+ schedule currently unavailable.

## 2023-10-21 ENCOUNTER — Ambulatory Visit: Attending: Cardiology

## 2023-10-21 DIAGNOSIS — I5032 Chronic diastolic (congestive) heart failure: Secondary | ICD-10-CM

## 2023-10-21 LAB — ECHOCARDIOGRAM COMPLETE
AR max vel: 1.94 cm2
AV Area VTI: 1.89 cm2
AV Area mean vel: 1.95 cm2
AV Mean grad: 6 mmHg
AV Peak grad: 11.3 mmHg
Ao pk vel: 1.68 m/s
Area-P 1/2: 2.73 cm2
S' Lateral: 2.75 cm

## 2023-10-22 ENCOUNTER — Ambulatory Visit (HOSPITAL_COMMUNITY): Payer: Self-pay | Admitting: Cardiology

## 2023-11-05 ENCOUNTER — Other Ambulatory Visit: Payer: Self-pay | Admitting: Nurse Practitioner

## 2023-11-05 DIAGNOSIS — Z1231 Encounter for screening mammogram for malignant neoplasm of breast: Secondary | ICD-10-CM

## 2023-12-15 ENCOUNTER — Ambulatory Visit
Admission: RE | Admit: 2023-12-15 | Discharge: 2023-12-15 | Disposition: A | Source: Ambulatory Visit | Attending: Nurse Practitioner | Admitting: Nurse Practitioner

## 2023-12-15 DIAGNOSIS — Z1231 Encounter for screening mammogram for malignant neoplasm of breast: Secondary | ICD-10-CM

## 2024-01-21 ENCOUNTER — Telehealth: Payer: Self-pay | Admitting: Cardiovascular Disease

## 2024-01-21 DIAGNOSIS — I4891 Unspecified atrial fibrillation: Secondary | ICD-10-CM

## 2024-01-21 NOTE — Telephone Encounter (Signed)
" °*  STAT* If patient is at the pharmacy, call can be transferred to refill team.   1. Which medications need to be refilled? (please list name of each medication and dose if known)   apixaban  (ELIQUIS ) 5 MG TABS tablet   2. Would you like to learn more about the convenience, safety, & potential cost savings by using the [facility]?   3. Are you open to using the [facility] (Type [facility]. ).  4. Which pharmacy/location (including street and city if local pharmacy) is medication to be sent to?  [id number] - [place],  - [address]   5. Do they need a 30 day or 90 day supply?   90 day  Daughter [place]) stated patient has 9 tablets left.  Patient has appointment scheduled with C. Franchester, PA on 5/20. "

## 2024-01-27 MED ORDER — APIXABAN 5 MG PO TABS: 5.0000 mg | ORAL_TABLET | Freq: Two times a day (BID) | ORAL | 1 refills | Status: AC

## 2024-01-27 NOTE — Telephone Encounter (Signed)
 Pt's medication was sent to pt's pharmacy as requested. Confirmation received.

## 2024-05-25 ENCOUNTER — Ambulatory Visit: Admitting: Medical
